# Patient Record
Sex: Female | Born: 1955
Health system: Southern US, Community
[De-identification: ages and names within clinical notes are randomized; demographics above are authoritative.]

## PROBLEM LIST (undated history)

## (undated) DIAGNOSIS — K219 Gastro-esophageal reflux disease without esophagitis: Secondary | ICD-10-CM

## (undated) DIAGNOSIS — T8859XA Other complications of anesthesia, initial encounter: Secondary | ICD-10-CM

## (undated) DIAGNOSIS — K635 Polyp of colon: Secondary | ICD-10-CM

## (undated) DIAGNOSIS — M8580 Other specified disorders of bone density and structure, unspecified site: Secondary | ICD-10-CM

## (undated) DIAGNOSIS — Z8719 Personal history of other diseases of the digestive system: Secondary | ICD-10-CM

## (undated) DIAGNOSIS — I471 Supraventricular tachycardia, unspecified: Secondary | ICD-10-CM

## (undated) DIAGNOSIS — B029 Zoster without complications: Secondary | ICD-10-CM

## (undated) DIAGNOSIS — I2542 Coronary artery dissection: Secondary | ICD-10-CM

## (undated) DIAGNOSIS — N890 Mild vaginal dysplasia: Secondary | ICD-10-CM

## (undated) DIAGNOSIS — R112 Nausea with vomiting, unspecified: Secondary | ICD-10-CM

## (undated) DIAGNOSIS — C50412 Malignant neoplasm of upper-outer quadrant of left female breast: Secondary | ICD-10-CM

## (undated) DIAGNOSIS — C50919 Malignant neoplasm of unspecified site of unspecified female breast: Secondary | ICD-10-CM

## (undated) DIAGNOSIS — S060X9A Concussion with loss of consciousness of unspecified duration, initial encounter: Secondary | ICD-10-CM

## (undated) DIAGNOSIS — Z8489 Family history of other specified conditions: Secondary | ICD-10-CM

## (undated) DIAGNOSIS — Z9889 Other specified postprocedural states: Secondary | ICD-10-CM

## (undated) DIAGNOSIS — I8390 Asymptomatic varicose veins of unspecified lower extremity: Secondary | ICD-10-CM

## (undated) DIAGNOSIS — G43909 Migraine, unspecified, not intractable, without status migrainosus: Secondary | ICD-10-CM

## (undated) DIAGNOSIS — I5189 Other ill-defined heart diseases: Secondary | ICD-10-CM

## (undated) DIAGNOSIS — Z923 Personal history of irradiation: Secondary | ICD-10-CM

## (undated) DIAGNOSIS — T4145XA Adverse effect of unspecified anesthetic, initial encounter: Secondary | ICD-10-CM

## (undated) DIAGNOSIS — I10 Essential (primary) hypertension: Secondary | ICD-10-CM

## (undated) DIAGNOSIS — R0789 Other chest pain: Secondary | ICD-10-CM

## (undated) HISTORY — PX: VAGINAL HYSTERECTOMY: SUR661

## (undated) HISTORY — DX: Polyp of colon: K63.5

## (undated) HISTORY — DX: Asymptomatic varicose veins of unspecified lower extremity: I83.90

## (undated) HISTORY — DX: Other ill-defined heart diseases: I51.89

## (undated) HISTORY — DX: Coronary artery dissection: I25.42

## (undated) HISTORY — DX: Supraventricular tachycardia, unspecified: I47.10

## (undated) HISTORY — DX: Malignant neoplasm of unspecified site of unspecified female breast: C50.919

## (undated) HISTORY — DX: Other chest pain: R07.89

## (undated) HISTORY — DX: Malignant neoplasm of upper-outer quadrant of left female breast: C50.412

## (undated) HISTORY — PX: TUBAL LIGATION: SHX77

## (undated) HISTORY — DX: Concussion with loss of consciousness of unspecified duration, initial encounter: S06.0X9A

## (undated) HISTORY — DX: Migraine, unspecified, not intractable, without status migrainosus: G43.909

## (undated) HISTORY — PX: MASTECTOMY: SHX3

## (undated) HISTORY — DX: Gastro-esophageal reflux disease without esophagitis: K21.9

## (undated) HISTORY — DX: Essential (primary) hypertension: I10

## (undated) HISTORY — PX: DILATION AND CURETTAGE OF UTERUS: SHX78

## (undated) HISTORY — DX: Other specified disorders of bone density and structure, unspecified site: M85.80

## (undated) HISTORY — DX: Zoster without complications: B02.9

## (undated) HISTORY — DX: Supraventricular tachycardia: I47.1

## (undated) HISTORY — DX: Mild vaginal dysplasia: N89.0

---

## 1977-01-30 DIAGNOSIS — S060X9A Concussion with loss of consciousness of unspecified duration, initial encounter: Secondary | ICD-10-CM

## 1977-01-30 DIAGNOSIS — S060XAA Concussion with loss of consciousness status unknown, initial encounter: Secondary | ICD-10-CM

## 1977-01-30 HISTORY — DX: Concussion with loss of consciousness of unspecified duration, initial encounter: S06.0X9A

## 1977-01-30 HISTORY — DX: Concussion with loss of consciousness status unknown, initial encounter: S06.0XAA

## 1978-01-30 HISTORY — PX: OTHER SURGICAL HISTORY: SHX169

## 2004-05-03 ENCOUNTER — Ambulatory Visit: Payer: Self-pay | Admitting: Obstetrics and Gynecology

## 2005-06-14 ENCOUNTER — Ambulatory Visit: Payer: Self-pay | Admitting: Obstetrics and Gynecology

## 2006-06-19 ENCOUNTER — Ambulatory Visit: Payer: Self-pay | Admitting: Obstetrics and Gynecology

## 2007-07-03 ENCOUNTER — Ambulatory Visit: Payer: Self-pay | Admitting: Obstetrics and Gynecology

## 2008-07-22 ENCOUNTER — Ambulatory Visit: Payer: Self-pay | Admitting: Obstetrics and Gynecology

## 2009-01-30 HISTORY — PX: COLONOSCOPY: SHX174

## 2009-01-30 LAB — HM COLONOSCOPY

## 2009-08-05 ENCOUNTER — Ambulatory Visit: Payer: Self-pay

## 2009-11-26 ENCOUNTER — Ambulatory Visit: Payer: Self-pay | Admitting: Unknown Physician Specialty

## 2010-08-05 ENCOUNTER — Ambulatory Visit: Payer: Self-pay | Admitting: Internal Medicine

## 2010-08-18 ENCOUNTER — Ambulatory Visit: Payer: Self-pay | Admitting: Surgery

## 2011-02-22 ENCOUNTER — Ambulatory Visit: Payer: Self-pay | Admitting: Surgery

## 2012-02-23 ENCOUNTER — Ambulatory Visit: Payer: Self-pay | Admitting: Obstetrics and Gynecology

## 2012-08-01 LAB — HM PAP SMEAR: HM Pap smear: NEGATIVE

## 2013-03-06 ENCOUNTER — Ambulatory Visit: Payer: Self-pay | Admitting: Obstetrics and Gynecology

## 2014-03-10 ENCOUNTER — Ambulatory Visit: Payer: Self-pay | Admitting: Obstetrics and Gynecology

## 2014-03-10 LAB — HM MAMMOGRAPHY

## 2014-08-11 ENCOUNTER — Encounter: Payer: Self-pay | Admitting: Obstetrics and Gynecology

## 2014-08-25 ENCOUNTER — Encounter: Payer: Self-pay | Admitting: Obstetrics and Gynecology

## 2014-08-25 ENCOUNTER — Ambulatory Visit (INDEPENDENT_AMBULATORY_CARE_PROVIDER_SITE_OTHER): Payer: No Typology Code available for payment source | Admitting: Obstetrics and Gynecology

## 2014-08-25 VITALS — BP 140/72 | HR 90 | Ht 62.0 in | Wt 155.3 lb

## 2014-08-25 DIAGNOSIS — Z1211 Encounter for screening for malignant neoplasm of colon: Secondary | ICD-10-CM | POA: Diagnosis not present

## 2014-08-25 DIAGNOSIS — Z780 Asymptomatic menopausal state: Secondary | ICD-10-CM

## 2014-08-25 DIAGNOSIS — Z0000 Encounter for general adult medical examination without abnormal findings: Secondary | ICD-10-CM

## 2014-08-25 DIAGNOSIS — I471 Supraventricular tachycardia: Secondary | ICD-10-CM | POA: Insufficient documentation

## 2014-08-25 DIAGNOSIS — K219 Gastro-esophageal reflux disease without esophagitis: Secondary | ICD-10-CM | POA: Insufficient documentation

## 2014-08-25 DIAGNOSIS — Z90710 Acquired absence of both cervix and uterus: Secondary | ICD-10-CM | POA: Diagnosis not present

## 2014-08-25 DIAGNOSIS — M76899 Other specified enthesopathies of unspecified lower limb, excluding foot: Secondary | ICD-10-CM | POA: Insufficient documentation

## 2014-08-25 DIAGNOSIS — N890 Mild vaginal dysplasia: Secondary | ICD-10-CM

## 2014-08-25 DIAGNOSIS — Z01419 Encounter for gynecological examination (general) (routine) without abnormal findings: Secondary | ICD-10-CM

## 2014-08-25 DIAGNOSIS — M545 Low back pain, unspecified: Secondary | ICD-10-CM | POA: Insufficient documentation

## 2014-08-25 DIAGNOSIS — R638 Other symptoms and signs concerning food and fluid intake: Secondary | ICD-10-CM | POA: Diagnosis not present

## 2014-08-25 DIAGNOSIS — B029 Zoster without complications: Secondary | ICD-10-CM | POA: Insufficient documentation

## 2014-08-25 DIAGNOSIS — Z8619 Personal history of other infectious and parasitic diseases: Secondary | ICD-10-CM | POA: Insufficient documentation

## 2014-08-25 DIAGNOSIS — E559 Vitamin D deficiency, unspecified: Secondary | ICD-10-CM | POA: Insufficient documentation

## 2014-08-25 DIAGNOSIS — G43909 Migraine, unspecified, not intractable, without status migrainosus: Secondary | ICD-10-CM | POA: Insufficient documentation

## 2014-08-25 DIAGNOSIS — I10 Essential (primary) hypertension: Secondary | ICD-10-CM | POA: Insufficient documentation

## 2014-08-25 MED ORDER — ESTRADIOL 0.5 MG PO TABS: 0.5000 mg | ORAL_TABLET | Freq: Every day | ORAL | Status: DC
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
## 2014-08-25 NOTE — Patient Instructions (Signed)
1. No Pap smear Until 2017. 2.  Continue with annual mammograms. 3.  Colon cancer screening with stool cards. 4.  Refill estradiol 0.5 mg daily. 5.  Continue with calcium and vitamin D for osteopenia area. 6.  Return in one year for annual exam

## 2014-08-31 ENCOUNTER — Other Ambulatory Visit: Payer: Self-pay | Admitting: Obstetrics and Gynecology

## 2014-09-02 ENCOUNTER — Other Ambulatory Visit: Payer: Self-pay | Admitting: Obstetrics and Gynecology

## 2014-09-04 ENCOUNTER — Other Ambulatory Visit: Payer: Self-pay | Admitting: Obstetrics and Gynecology

## 2014-09-06 LAB — FECAL OCCULT BLOOD, IMMUNOCHEMICAL: FECAL OCCULT BLD: NEGATIVE

## 2015-03-18 ENCOUNTER — Other Ambulatory Visit: Payer: Self-pay | Admitting: Obstetrics and Gynecology

## 2015-03-18 DIAGNOSIS — Z1231 Encounter for screening mammogram for malignant neoplasm of breast: Secondary | ICD-10-CM

## 2015-04-06 ENCOUNTER — Ambulatory Visit
Admission: RE | Admit: 2015-04-06 | Discharge: 2015-04-06 | Disposition: A | Discharge status: Home / Self Care | Payer: No Typology Code available for payment source | Source: Ambulatory Visit | Attending: Obstetrics and Gynecology | Admitting: Obstetrics and Gynecology

## 2015-04-06 DIAGNOSIS — Z1231 Encounter for screening mammogram for malignant neoplasm of breast: Secondary | ICD-10-CM | POA: Insufficient documentation

## 2015-08-26 ENCOUNTER — Ambulatory Visit (INDEPENDENT_AMBULATORY_CARE_PROVIDER_SITE_OTHER): Payer: No Typology Code available for payment source | Admitting: Obstetrics and Gynecology

## 2015-08-26 ENCOUNTER — Encounter: Payer: Self-pay | Admitting: Obstetrics and Gynecology

## 2015-08-26 VITALS — BP 123/84 | HR 84 | Ht 62.0 in | Wt 157.6 lb

## 2015-08-26 DIAGNOSIS — N951 Menopausal and female climacteric states: Secondary | ICD-10-CM | POA: Diagnosis not present

## 2015-08-26 DIAGNOSIS — Z01419 Encounter for gynecological examination (general) (routine) without abnormal findings: Secondary | ICD-10-CM

## 2015-08-26 DIAGNOSIS — Z1211 Encounter for screening for malignant neoplasm of colon: Secondary | ICD-10-CM

## 2015-08-26 DIAGNOSIS — Z90710 Acquired absence of both cervix and uterus: Secondary | ICD-10-CM | POA: Diagnosis not present

## 2015-08-26 DIAGNOSIS — N900 Mild vulvar dysplasia: Secondary | ICD-10-CM

## 2015-08-26 MED ORDER — ESTRADIOL 0.5 MG PO TABS: 0.5000 mg | ORAL_TABLET | Freq: Every day | ORAL | 10 refills | Status: DC

## 2015-08-26 NOTE — Patient Instructions (Signed)
Preventive Care for Adults, Female A healthy lifestyle and preventive care can promote health and wellness. Preventive health guidelines for women include the following key practices.  A routine yearly physical is a good way to check with your health care provider about your health and preventive screening. It is a chance to share any concerns and updates on your health and to receive a thorough exam.  Visit your dentist for a routine exam and preventive care every 6 months. Brush your teeth twice a day and floss once a day. Good oral hygiene prevents tooth decay and gum disease.  The frequency of eye exams is based on your age, health, family medical history, use of contact lenses, and other factors. Follow your health care provider's recommendations for frequency of eye exams.  Eat a healthy diet. Foods like vegetables, fruits, whole grains, low-fat dairy products, and lean protein foods contain the nutrients you need without too many calories. Decrease your intake of foods high in solid fats, added sugars, and salt. Eat the right amount of calories for you.Get information about a proper diet from your health care provider, if necessary.  Regular physical exercise is one of the most important things you can do for your health. Most adults should get at least 150 minutes of moderate-intensity exercise (any activity that increases your heart rate and causes you to sweat) each week. In addition, most adults need muscle-strengthening exercises on 2 or more days a week.  Maintain a healthy weight. The body mass index (BMI) is a screening tool to identify possible weight problems. It provides an estimate of body fat based on height and weight. Your health care provider can find your BMI and can help you achieve or maintain a healthy weight.For adults 20 years and older:  A BMI below 18.5 is considered underweight.  A BMI of 18.5 to 24.9 is normal.  A BMI of 25 to 29.9 is considered overweight.  A  BMI of 30 and above is considered obese.  Maintain normal blood lipids and cholesterol levels by exercising and minimizing your intake of saturated fat. Eat a balanced diet with plenty of fruit and vegetables. Blood tests for lipids and cholesterol should begin at age 45 and be repeated every 5 years. If your lipid or cholesterol levels are high, you are over 50, or you are at high risk for heart disease, you may need your cholesterol levels checked more frequently.Ongoing high lipid and cholesterol levels should be treated with medicines if diet and exercise are not working.  If you smoke, find out from your health care provider how to quit. If you do not use tobacco, do not start.  Lung cancer screening is recommended for adults aged 45-80 years who are at high risk for developing lung cancer because of a history of smoking. A yearly low-dose CT scan of the lungs is recommended for people who have at least a 30-pack-year history of smoking and are a current smoker or have quit within the past 15 years. A pack year of smoking is smoking an average of 1 pack of cigarettes a day for 1 year (for example: 1 pack a day for 30 years or 2 packs a day for 15 years). Yearly screening should continue until the smoker has stopped smoking for at least 15 years. Yearly screening should be stopped for people who develop a health problem that would prevent them from having lung cancer treatment.  If you are pregnant, do not drink alcohol. If you are  breastfeeding, be very cautious about drinking alcohol. If you are not pregnant and choose to drink alcohol, do not have more than 1 drink per day. One drink is considered to be 12 ounces (355 mL) of beer, 5 ounces (148 mL) of wine, or 1.5 ounces (44 mL) of liquor.  Avoid use of street drugs. Do not share needles with anyone. Ask for help if you need support or instructions about stopping the use of drugs.  High blood pressure causes heart disease and increases the risk  of stroke. Your blood pressure should be checked at least every 1 to 2 years. Ongoing high blood pressure should be treated with medicines if weight loss and exercise do not work.  If you are 55-79 years old, ask your health care provider if you should take aspirin to prevent strokes.  Diabetes screening is done by taking a blood sample to check your blood glucose level after you have not eaten for a certain period of time (fasting). If you are not overweight and you do not have risk factors for diabetes, you should be screened once every 3 years starting at age 45. If you are overweight or obese and you are 40-70 years of age, you should be screened for diabetes every year as part of your cardiovascular risk assessment.  Breast cancer screening is essential preventive care for women. You should practice "breast self-awareness." This means understanding the normal appearance and feel of your breasts and may include breast self-examination. Any changes detected, no matter how small, should be reported to a health care provider. Women in their 20s and 30s should have a clinical breast exam (CBE) by a health care provider as part of a regular health exam every 1 to 3 years. After age 40, women should have a CBE every year. Starting at age 40, women should consider having a mammogram (breast X-ray test) every year. Women who have a family history of breast cancer should talk to their health care provider about genetic screening. Women at a high risk of breast cancer should talk to their health care providers about having an MRI and a mammogram every year.  Breast cancer gene (BRCA)-related cancer risk assessment is recommended for women who have family members with BRCA-related cancers. BRCA-related cancers include breast, ovarian, tubal, and peritoneal cancers. Having family members with these cancers may be associated with an increased risk for harmful changes (mutations) in the breast cancer genes BRCA1 and  BRCA2. Results of the assessment will determine the need for genetic counseling and BRCA1 and BRCA2 testing.  Your health care provider may recommend that you be screened regularly for cancer of the pelvic organs (ovaries, uterus, and vagina). This screening involves a pelvic examination, including checking for microscopic changes to the surface of your cervix (Pap test). You may be encouraged to have this screening done every 3 years, beginning at age 21.  For women ages 30-65, health care providers may recommend pelvic exams and Pap testing every 3 years, or they may recommend the Pap and pelvic exam, combined with testing for human papilloma virus (HPV), every 5 years. Some types of HPV increase your risk of cervical cancer. Testing for HPV may also be done on women of any age with unclear Pap test results.  Other health care providers may not recommend any screening for nonpregnant women who are considered low risk for pelvic cancer and who do not have symptoms. Ask your health care provider if a screening pelvic exam is right for   you.  If you have had past treatment for cervical cancer or a condition that could lead to cancer, you need Pap tests and screening for cancer for at least 20 years after your treatment. If Pap tests have been discontinued, your risk factors (such as having a new sexual partner) need to be reassessed to determine if screening should resume. Some women have medical problems that increase the chance of getting cervical cancer. In these cases, your health care provider may recommend more frequent screening and Pap tests.  Colorectal cancer can be detected and often prevented. Most routine colorectal cancer screening begins at the age of 50 years and continues through age 75 years. However, your health care provider may recommend screening at an earlier age if you have risk factors for colon cancer. On a yearly basis, your health care provider may provide home test kits to check  for hidden blood in the stool. Use of a small camera at the end of a tube, to directly examine the colon (sigmoidoscopy or colonoscopy), can detect the earliest forms of colorectal cancer. Talk to your health care provider about this at age 50, when routine screening begins. Direct exam of the colon should be repeated every 5-10 years through age 75 years, unless early forms of precancerous polyps or small growths are found.  People who are at an increased risk for hepatitis B should be screened for this virus. You are considered at high risk for hepatitis B if:  You were born in a country where hepatitis B occurs often. Talk with your health care provider about which countries are considered high risk.  Your parents were born in a high-risk country and you have not received a shot to protect against hepatitis B (hepatitis B vaccine).  You have HIV or AIDS.  You use needles to inject street drugs.  You live with, or have sex with, someone who has hepatitis B.  You get hemodialysis treatment.  You take certain medicines for conditions like cancer, organ transplantation, and autoimmune conditions.  Hepatitis C blood testing is recommended for all people born from 1945 through 1965 and any individual with known risks for hepatitis C.  Practice safe sex. Use condoms and avoid high-risk sexual practices to reduce the spread of sexually transmitted infections (STIs). STIs include gonorrhea, chlamydia, syphilis, trichomonas, herpes, HPV, and human immunodeficiency virus (HIV). Herpes, HIV, and HPV are viral illnesses that have no cure. They can result in disability, cancer, and death.  You should be screened for sexually transmitted illnesses (STIs) including gonorrhea and chlamydia if:  You are sexually active and are younger than 24 years.  You are older than 24 years and your health care provider tells you that you are at risk for this type of infection.  Your sexual activity has changed  since you were last screened and you are at an increased risk for chlamydia or gonorrhea. Ask your health care provider if you are at risk.  If you are at risk of being infected with HIV, it is recommended that you take a prescription medicine daily to prevent HIV infection. This is called preexposure prophylaxis (PrEP). You are considered at risk if:  You are sexually active and do not regularly use condoms or know the HIV status of your partner(s).  You take drugs by injection.  You are sexually active with a partner who has HIV.  Talk with your health care provider about whether you are at high risk of being infected with HIV. If   you choose to begin PrEP, you should first be tested for HIV. You should then be tested every 3 months for as long as you are taking PrEP.  Osteoporosis is a disease in which the bones lose minerals and strength with aging. This can result in serious bone fractures or breaks. The risk of osteoporosis can be identified using a bone density scan. Women ages 67 years and over and women at risk for fractures or osteoporosis should discuss screening with their health care providers. Ask your health care provider whether you should take a calcium supplement or vitamin D to reduce the rate of osteoporosis.  Menopause can be associated with physical symptoms and risks. Hormone replacement therapy is available to decrease symptoms and risks. You should talk to your health care provider about whether hormone replacement therapy is right for you.  Use sunscreen. Apply sunscreen liberally and repeatedly throughout the day. You should seek shade when your shadow is shorter than you. Protect yourself by wearing long sleeves, pants, a wide-brimmed hat, and sunglasses year round, whenever you are outdoors.  Once a month, do a whole body skin exam, using a mirror to look at the skin on your back. Tell your health care provider of new moles, moles that have irregular borders, moles that  are larger than a pencil eraser, or moles that have changed in shape or color.  Stay current with required vaccines (immunizations).  Influenza vaccine. All adults should be immunized every year.  Tetanus, diphtheria, and acellular pertussis (Td, Tdap) vaccine. Pregnant women should receive 1 dose of Tdap vaccine during each pregnancy. The dose should be obtained regardless of the length of time since the last dose. Immunization is preferred during the 27th-36th week of gestation. An adult who has not previously received Tdap or who does not know her vaccine status should receive 1 dose of Tdap. This initial dose should be followed by tetanus and diphtheria toxoids (Td) booster doses every 10 years. Adults with an unknown or incomplete history of completing a 3-dose immunization series with Td-containing vaccines should begin or complete a primary immunization series including a Tdap dose. Adults should receive a Td booster every 10 years.  Varicella vaccine. An adult without evidence of immunity to varicella should receive 2 doses or a second dose if she has previously received 1 dose. Pregnant females who do not have evidence of immunity should receive the first dose after pregnancy. This first dose should be obtained before leaving the health care facility. The second dose should be obtained 4-8 weeks after the first dose.  Human papillomavirus (HPV) vaccine. Females aged 13-26 years who have not received the vaccine previously should obtain the 3-dose series. The vaccine is not recommended for use in pregnant females. However, pregnancy testing is not needed before receiving a dose. If a female is found to be pregnant after receiving a dose, no treatment is needed. In that case, the remaining doses should be delayed until after the pregnancy. Immunization is recommended for any person with an immunocompromised condition through the age of 61 years if she did not get any or all doses earlier. During the  3-dose series, the second dose should be obtained 4-8 weeks after the first dose. The third dose should be obtained 24 weeks after the first dose and 16 weeks after the second dose.  Zoster vaccine. One dose is recommended for adults aged 30 years or older unless certain conditions are present.  Measles, mumps, and rubella (MMR) vaccine. Adults born  before 1957 generally are considered immune to measles and mumps. Adults born in 1957 or later should have 1 or more doses of MMR vaccine unless there is a contraindication to the vaccine or there is laboratory evidence of immunity to each of the three diseases. A routine second dose of MMR vaccine should be obtained at least 28 days after the first dose for students attending postsecondary schools, health care workers, or international travelers. People who received inactivated measles vaccine or an unknown type of measles vaccine during 1963-1967 should receive 2 doses of MMR vaccine. People who received inactivated mumps vaccine or an unknown type of mumps vaccine before 1979 and are at high risk for mumps infection should consider immunization with 2 doses of MMR vaccine. For females of childbearing age, rubella immunity should be determined. If there is no evidence of immunity, females who are not pregnant should be vaccinated. If there is no evidence of immunity, females who are pregnant should delay immunization until after pregnancy. Unvaccinated health care workers born before 1957 who lack laboratory evidence of measles, mumps, or rubella immunity or laboratory confirmation of disease should consider measles and mumps immunization with 2 doses of MMR vaccine or rubella immunization with 1 dose of MMR vaccine.  Pneumococcal 13-valent conjugate (PCV13) vaccine. When indicated, a person who is uncertain of his immunization history and has no record of immunization should receive the PCV13 vaccine. All adults 65 years of age and older should receive this  vaccine. An adult aged 19 years or older who has certain medical conditions and has not been previously immunized should receive 1 dose of PCV13 vaccine. This PCV13 should be followed with a dose of pneumococcal polysaccharide (PPSV23) vaccine. Adults who are at high risk for pneumococcal disease should obtain the PPSV23 vaccine at least 8 weeks after the dose of PCV13 vaccine. Adults older than 60 years of age who have normal immune system function should obtain the PPSV23 vaccine dose at least 1 year after the dose of PCV13 vaccine.  Pneumococcal polysaccharide (PPSV23) vaccine. When PCV13 is also indicated, PCV13 should be obtained first. All adults aged 65 years and older should be immunized. An adult younger than age 65 years who has certain medical conditions should be immunized. Any person who resides in a nursing home or long-term care facility should be immunized. An adult smoker should be immunized. People with an immunocompromised condition and certain other conditions should receive both PCV13 and PPSV23 vaccines. People with human immunodeficiency virus (HIV) infection should be immunized as soon as possible after diagnosis. Immunization during chemotherapy or radiation therapy should be avoided. Routine use of PPSV23 vaccine is not recommended for American Indians, [place] Natives, or people younger than 65 years unless there are medical conditions that require PPSV23 vaccine. When indicated, people who have unknown immunization and have no record of immunization should receive PPSV23 vaccine. One-time revaccination 5 years after the first dose of PPSV23 is recommended for people aged 19-64 years who have chronic kidney failure, nephrotic syndrome, asplenia, or immunocompromised conditions. People who received 1-2 doses of PPSV23 before age 65 years should receive another dose of PPSV23 vaccine at age 65 years or later if at least 5 years have passed since the previous dose. Doses of PPSV23 are not  needed for people immunized with PPSV23 at or after age 65 years.  Meningococcal vaccine. Adults with asplenia or persistent complement component deficiencies should receive 2 doses of quadrivalent meningococcal conjugate (MenACWY-D) vaccine. The doses should be obtained   at least 2 months apart. Microbiologists working with certain meningococcal bacteria, [place] recruits, people at risk during an outbreak, and people who travel to or live in countries with a high rate of meningitis should be immunized. A first-year college student up through age 34 years who is living in a residence hall should receive a dose if she did not receive a dose on or after her 16th birthday. Adults who have certain high-risk conditions should receive one or more doses of vaccine.  Hepatitis A vaccine. Adults who wish to be protected from this disease, have certain high-risk conditions, work with hepatitis A-infected animals, work in hepatitis A research labs, or travel to or work in countries with a high rate of hepatitis A should be immunized. Adults who were previously unvaccinated and who anticipate close contact with an international adoptee during the first 60 days after arrival in the [place] from a country with a high rate of hepatitis A should be immunized.  Hepatitis B vaccine. Adults who wish to be protected from this disease, have certain high-risk conditions, may be exposed to blood or other infectious body fluids, are household contacts or sex partners of hepatitis B positive people, are clients or workers in certain care facilities, or travel to or work in countries with a high rate of hepatitis B should be immunized.  Haemophilus influenzae type b (Hib) vaccine. A previously unvaccinated person with asplenia or sickle cell disease or having a scheduled splenectomy should receive 1 dose of Hib vaccine. Regardless of previous immunization, a recipient of a hematopoietic stem cell transplant should receive a  3-dose series 6-12 months after her successful transplant. Hib vaccine is not recommended for adults with HIV infection. Preventive Services / Frequency Ages 35 to 4 years  Blood pressure check.** / Every 3-5 years.  Lipid and cholesterol check.** / Every 5 years beginning at age 60.  Clinical breast exam.** / Every 3 years for women in their 71s and 10s.  BRCA-related cancer risk assessment.** / For women who have family members with a BRCA-related cancer (breast, ovarian, tubal, or peritoneal cancers).  Pap test.** / Every 2 years from ages 76 through 26. Every 3 years starting at age 61 through age 76 or 93 with a history of 3 consecutive normal Pap tests.  HPV screening.** / Every 3 years from ages 37 through ages 60 to 51 with a history of 3 consecutive normal Pap tests.  Hepatitis C blood test.** / For any individual with known risks for hepatitis C.  Skin self-exam. / Monthly.  Influenza vaccine. / Every year.  Tetanus, diphtheria, and acellular pertussis (Tdap, Td) vaccine.** / Consult your health care provider. Pregnant women should receive 1 dose of Tdap vaccine during each pregnancy. 1 dose of Td every 10 years.  Varicella vaccine.** / Consult your health care provider. Pregnant females who do not have evidence of immunity should receive the first dose after pregnancy.  HPV vaccine. / 3 doses over 6 months, if 93 and younger. The vaccine is not recommended for use in pregnant females. However, pregnancy testing is not needed before receiving a dose.  Measles, mumps, rubella (MMR) vaccine.** / You need at least 1 dose of MMR if you were born in 1957 or later. You may also need a 2nd dose. For females of childbearing age, rubella immunity should be determined. If there is no evidence of immunity, females who are not pregnant should be vaccinated. If there is no evidence of immunity, females who are  pregnant should delay immunization until after pregnancy.  Pneumococcal  13-valent conjugate (PCV13) vaccine.** / Consult your health care provider.  Pneumococcal polysaccharide (PPSV23) vaccine.** / 1 to 2 doses if you smoke cigarettes or if you have certain conditions.  Meningococcal vaccine.** / 1 dose if you are age 68 to 8 years and a Market researcher living in a residence hall, or have one of several medical conditions, you need to get vaccinated against meningococcal disease. You may also need additional booster doses.  Hepatitis A vaccine.** / Consult your health care provider.  Hepatitis B vaccine.** / Consult your health care provider.  Haemophilus influenzae type b (Hib) vaccine.** / Consult your health care provider. Ages 7 to 53 years  Blood pressure check.** / Every year.  Lipid and cholesterol check.** / Every 5 years beginning at age 25 years.  Lung cancer screening. / Every year if you are aged 11-80 years and have a 30-pack-year history of smoking and currently smoke or have quit within the past 15 years. Yearly screening is stopped once you have quit smoking for at least 15 years or develop a health problem that would prevent you from having lung cancer treatment.  Clinical breast exam.** / Every year after age 48 years.  BRCA-related cancer risk assessment.** / For women who have family members with a BRCA-related cancer (breast, ovarian, tubal, or peritoneal cancers).  Mammogram.** / Every year beginning at age 41 years and continuing for as long as you are in good health. Consult with your health care provider.  Pap test.** / Every 3 years starting at age 65 years through age 37 or 70 years with a history of 3 consecutive normal Pap tests.  HPV screening.** / Every 3 years from ages 72 years through ages 60 to 40 years with a history of 3 consecutive normal Pap tests.  Fecal occult blood test (FOBT) of stool. / Every year beginning at age 21 years and continuing until age 5 years. You may not need to do this test if you get  a colonoscopy every 10 years.  Flexible sigmoidoscopy or colonoscopy.** / Every 5 years for a flexible sigmoidoscopy or every 10 years for a colonoscopy beginning at age 35 years and continuing until age 48 years.  Hepatitis C blood test.** / For all people born from [birth year] through 1965 and any individual with known risks for hepatitis C.  Skin self-exam. / Monthly.  Influenza vaccine. / Every year.  Tetanus, diphtheria, and acellular pertussis (Tdap/Td) vaccine.** / Consult your health care provider. Pregnant women should receive 1 dose of Tdap vaccine during each pregnancy. 1 dose of Td every 10 years.  Varicella vaccine.** / Consult your health care provider. Pregnant females who do not have evidence of immunity should receive the first dose after pregnancy.  Zoster vaccine.** / 1 dose for adults aged 30 years or older.  Measles, mumps, rubella (MMR) vaccine.** / You need at least 1 dose of MMR if you were born in 1957 or later. You may also need a second dose. For females of childbearing age, rubella immunity should be determined. If there is no evidence of immunity, females who are not pregnant should be vaccinated. If there is no evidence of immunity, females who are pregnant should delay immunization until after pregnancy.  Pneumococcal 13-valent conjugate (PCV13) vaccine.** / Consult your health care provider.  Pneumococcal polysaccharide (PPSV23) vaccine.** / 1 to 2 doses if you smoke cigarettes or if you have certain conditions.  Meningococcal vaccine.** /  Consult your health care provider.  Hepatitis A vaccine.** / Consult your health care provider.  Hepatitis B vaccine.** / Consult your health care provider.  Haemophilus influenzae type b (Hib) vaccine.** / Consult your health care provider. Ages 64 years and over  Blood pressure check.** / Every year.  Lipid and cholesterol check.** / Every 5 years beginning at age 23 years.  Lung cancer screening. / Every year if you  are aged 16-80 years and have a 30-pack-year history of smoking and currently smoke or have quit within the past 15 years. Yearly screening is stopped once you have quit smoking for at least 15 years or develop a health problem that would prevent you from having lung cancer treatment.  Clinical breast exam.** / Every year after age 74 years.  BRCA-related cancer risk assessment.** / For women who have family members with a BRCA-related cancer (breast, ovarian, tubal, or peritoneal cancers).  Mammogram.** / Every year beginning at age 44 years and continuing for as long as you are in good health. Consult with your health care provider.  Pap test.** / Every 3 years starting at age 58 years through age 22 or 39 years with 3 consecutive normal Pap tests. Testing can be stopped between 65 and 70 years with 3 consecutive normal Pap tests and no abnormal Pap or HPV tests in the past 10 years.  HPV screening.** / Every 3 years from ages 64 years through ages 70 or 61 years with a history of 3 consecutive normal Pap tests. Testing can be stopped between 65 and 70 years with 3 consecutive normal Pap tests and no abnormal Pap or HPV tests in the past 10 years.  Fecal occult blood test (FOBT) of stool. / Every year beginning at age 40 years and continuing until age 27 years. You may not need to do this test if you get a colonoscopy every 10 years.  Flexible sigmoidoscopy or colonoscopy.** / Every 5 years for a flexible sigmoidoscopy or every 10 years for a colonoscopy beginning at age 7 years and continuing until age 32 years.  Hepatitis C blood test.** / For all people born from [birth year] through 1965 and any individual with known risks for hepatitis C.  Osteoporosis screening.** / A one-time screening for women ages 30 years and over and women at risk for fractures or osteoporosis.  Skin self-exam. / Monthly.  Influenza vaccine. / Every year.  Tetanus, diphtheria, and acellular pertussis (Tdap/Td)  vaccine.** / 1 dose of Td every 10 years.  Varicella vaccine.** / Consult your health care provider.  Zoster vaccine.** / 1 dose for adults aged 35 years or older.  Pneumococcal 13-valent conjugate (PCV13) vaccine.** / Consult your health care provider.  Pneumococcal polysaccharide (PPSV23) vaccine.** / 1 dose for all adults aged 46 years and older.  Meningococcal vaccine.** / Consult your health care provider.  Hepatitis A vaccine.** / Consult your health care provider.  Hepatitis B vaccine.** / Consult your health care provider.  Haemophilus influenzae type b (Hib) vaccine.** / Consult your health care provider. ** Family history and personal history of risk and conditions may change your health care provider's recommendations.   This information is not intended to replace advice given to you by your health care provider. Make sure you discuss any questions you have with your health care provider.   Document Released: 03/14/2001 Document Revised: 02/06/2014 Document Reviewed: 06/13/2010 Elsevier Interactive Patient Education [employer].
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
## 2015-09-02 LAB — PAP IG AND HPV HIGH-RISK: PAP SMEAR COMMENT: 0

## 2015-09-02 LAB — HPV, LOW VOLUME (REFLEX): HPV low volume reflex: NEGATIVE

## 2015-09-05 LAB — FECAL OCCULT BLOOD, IMMUNOCHEMICAL: FECAL OCCULT BLD: NEGATIVE

## 2015-09-06 ENCOUNTER — Other Ambulatory Visit: Payer: Self-pay | Admitting: Obstetrics and Gynecology

## 2015-09-07 ENCOUNTER — Other Ambulatory Visit: Payer: Self-pay | Admitting: Obstetrics and Gynecology

## 2016-03-06 ENCOUNTER — Other Ambulatory Visit: Payer: Self-pay | Admitting: Obstetrics and Gynecology

## 2016-03-06 DIAGNOSIS — Z1231 Encounter for screening mammogram for malignant neoplasm of breast: Secondary | ICD-10-CM

## 2016-04-06 ENCOUNTER — Ambulatory Visit
Admission: RE | Admit: 2016-04-06 | Discharge: 2016-04-06 | Disposition: A | Discharge status: Home / Self Care | Payer: No Typology Code available for payment source | Source: Ambulatory Visit | Attending: Obstetrics and Gynecology | Admitting: Obstetrics and Gynecology

## 2016-04-06 DIAGNOSIS — Z1231 Encounter for screening mammogram for malignant neoplasm of breast: Secondary | ICD-10-CM

## 2016-04-18 ENCOUNTER — Other Ambulatory Visit: Payer: Self-pay

## 2016-04-18 MED ORDER — ESTRADIOL 0.5 MG PO TABS: 0.5000 mg | ORAL_TABLET | Freq: Every day | ORAL | 1 refills | Status: DC

## 2016-08-16 DIAGNOSIS — H524 Presbyopia: Secondary | ICD-10-CM | POA: Diagnosis not present

## 2016-08-31 ENCOUNTER — Encounter: Payer: No Typology Code available for payment source | Admitting: Obstetrics and Gynecology
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
## 2016-09-13 ENCOUNTER — Ambulatory Visit (INDEPENDENT_AMBULATORY_CARE_PROVIDER_SITE_OTHER): Payer: BLUE CROSS/BLUE SHIELD | Admitting: Obstetrics and Gynecology

## 2016-09-13 ENCOUNTER — Encounter: Payer: Self-pay | Admitting: Obstetrics and Gynecology

## 2016-09-13 VITALS — BP 119/77 | HR 85 | Ht 62.0 in | Wt 155.8 lb

## 2016-09-13 DIAGNOSIS — N951 Menopausal and female climacteric states: Secondary | ICD-10-CM

## 2016-09-13 DIAGNOSIS — Z1211 Encounter for screening for malignant neoplasm of colon: Secondary | ICD-10-CM

## 2016-09-13 DIAGNOSIS — Z01419 Encounter for gynecological examination (general) (routine) without abnormal findings: Secondary | ICD-10-CM | POA: Diagnosis not present

## 2016-09-13 DIAGNOSIS — Z90710 Acquired absence of both cervix and uterus: Secondary | ICD-10-CM | POA: Diagnosis not present

## 2016-09-13 MED ORDER — ESTRADIOL 0.5 MG PO TABS: 0.5000 mg | ORAL_TABLET | Freq: Every day | ORAL | 11 refills | Status: DC

## 2016-09-13 NOTE — Patient Instructions (Addendum)
1. No Pap smear needed 2. Mammogram up-to-date 3. Stool guaiac cards are given for colon cancer screening 4. Milligrams her creatinine through primary care 5. Continue with healthy eating and exercise 6. Return in 1 year for annual    Health Maintenance for Postmenopausal Women Menopause is a normal process in which your reproductive ability comes to an end. This process happens gradually over a span of months to years, usually between the ages of 41 and 50. Menopause is complete when you have missed 12 consecutive menstrual periods. It is important to talk with your health care provider about some of the most common conditions that affect postmenopausal women, such as heart disease, cancer, and bone loss (osteoporosis). Adopting a healthy lifestyle and getting preventive care can help to promote your health and wellness. Those actions can also lower your chances of developing some of these common conditions. What should I know about menopause? During menopause, you may experience a number of symptoms, such as:  Moderate-to-severe hot flashes.  Night sweats.  Decrease in sex drive.  Mood swings.  Headaches.  Tiredness.  Irritability.  Memory problems.  Insomnia.  Choosing to treat or not to treat menopausal changes is an individual decision that you make with your health care provider. What should I know about hormone replacement therapy and supplements? Hormone therapy products are effective for treating symptoms that are associated with menopause, such as hot flashes and night sweats. Hormone replacement carries certain risks, especially as you become older. If you are thinking about using estrogen or estrogen with progestin treatments, discuss the benefits and risks with your health care provider. What should I know about heart disease and stroke? Heart disease, heart attack, and stroke become more likely as you age. This may be due, in part, to the hormonal changes that your  body experiences during menopause. These can affect how your body processes dietary fats, triglycerides, and cholesterol. Heart attack and stroke are both medical emergencies. There are many things that you can do to help prevent heart disease and stroke:  Have your blood pressure checked at least every 1-2 years. High blood pressure causes heart disease and increases the risk of stroke.  If you are 61-34 years old, ask your health care provider if you should take aspirin to prevent a heart attack or a stroke.  Do not use any tobacco products, including cigarettes, chewing tobacco, or electronic cigarettes. If you need help quitting, ask your health care provider.  It is important to eat a healthy diet and maintain a healthy weight. ? Be sure to include plenty of vegetables, fruits, low-fat dairy products, and lean protein. ? Avoid eating foods that are high in solid fats, added sugars, or salt (sodium).  Get regular exercise. This is one of the most important things that you can do for your health. ? Try to exercise for at least 150 minutes each week. The type of exercise that you do should increase your heart rate and make you sweat. This is known as moderate-intensity exercise. ? Try to do strengthening exercises at least twice each week. Do these in addition to the moderate-intensity exercise.  Know your numbers.Ask your health care provider to check your cholesterol and your blood glucose. Continue to have your blood tested as directed by your health care provider.  What should I know about cancer screening? There are several types of cancer. Take the following steps to reduce your risk and to catch any cancer development as early as possible. Breast  Cancer  Practice breast self-awareness. ? This means understanding how your breasts normally appear and feel. ? It also means doing regular breast self-exams. Let your health care provider know about any changes, no matter how small.  If  you are 30 or older, have a clinician do a breast exam (clinical breast exam or CBE) every year. Depending on your age, family history, and medical history, it may be recommended that you also have a yearly breast X-ray (mammogram).  If you have a family history of breast cancer, talk with your health care provider about genetic screening.  If you are at high risk for breast cancer, talk with your health care provider about having an MRI and a mammogram every year.  Breast cancer (BRCA) gene test is recommended for women who have family members with BRCA-related cancers. Results of the assessment will determine the need for genetic counseling and BRCA1 and for BRCA2 testing. BRCA-related cancers include these types: ? Breast. This occurs in males or females. ? Ovarian. ? Tubal. This may also be called fallopian tube cancer. ? Cancer of the abdominal or pelvic lining (peritoneal cancer). ? Prostate. ? Pancreatic.  Cervical, Uterine, and Ovarian Cancer Your health care provider may recommend that you be screened regularly for cancer of the pelvic organs. These include your ovaries, uterus, and vagina. This screening involves a pelvic exam, which includes checking for microscopic changes to the surface of your cervix (Pap test).  For women ages 21-65, health care providers may recommend a pelvic exam and a Pap test every three years. For women ages 57-65, they may recommend the Pap test and pelvic exam, combined with testing for human papilloma virus (HPV), every five years. Some types of HPV increase your risk of cervical cancer. Testing for HPV may also be done on women of any age who have unclear Pap test results.  Other health care providers may not recommend any screening for nonpregnant women who are considered low risk for pelvic cancer and have no symptoms. Ask your health care provider if a screening pelvic exam is right for you.  If you have had past treatment for cervical cancer or a  condition that could lead to cancer, you need Pap tests and screening for cancer for at least 20 years after your treatment. If Pap tests have been discontinued for you, your risk factors (such as having a new sexual partner) need to be reassessed to determine if you should start having screenings again. Some women have medical problems that increase the chance of getting cervical cancer. In these cases, your health care provider may recommend that you have screening and Pap tests more often.  If you have a family history of uterine cancer or ovarian cancer, talk with your health care provider about genetic screening.  If you have vaginal bleeding after reaching menopause, tell your health care provider.  There are currently no reliable tests available to screen for ovarian cancer.  Lung Cancer Lung cancer screening is recommended for adults 79-7 years old who are at high risk for lung cancer because of a history of smoking. A yearly low-dose CT scan of the lungs is recommended if you:  Currently smoke.  Have a history of at least 30 pack-years of smoking and you currently smoke or have quit within the past 15 years. A pack-year is smoking an average of one pack of cigarettes per day for one year.  Yearly screening should:  Continue until it has been 15 years since you  quit.  Stop if you develop a health problem that would prevent you from having lung cancer treatment.  Colorectal Cancer  This type of cancer can be detected and can often be prevented.  Routine colorectal cancer screening usually begins at age 67 and continues through age 73.  If you have risk factors for colon cancer, your health care provider may recommend that you be screened at an earlier age.  If you have a family history of colorectal cancer, talk with your health care provider about genetic screening.  Your health care provider may also recommend using home test kits to check for hidden blood in your stool.  A  small camera at the end of a tube can be used to examine your colon directly (sigmoidoscopy or colonoscopy). This is done to check for the earliest forms of colorectal cancer.  Direct examination of the colon should be repeated every 5-10 years until age 61. However, if early forms of precancerous polyps or small growths are found or if you have a family history or genetic risk for colorectal cancer, you may need to be screened more often.  Skin Cancer  Check your skin from head to toe regularly.  Monitor any moles. Be sure to tell your health care provider: ? About any new moles or changes in moles, especially if there is a change in a mole's shape or color. ? If you have a mole that is larger than the size of a pencil eraser.  If any of your family members has a history of skin cancer, especially at a young age, talk with your health care provider about genetic screening.  Always use sunscreen. Apply sunscreen liberally and repeatedly throughout the day.  Whenever you are outside, protect yourself by wearing long sleeves, pants, a wide-brimmed hat, and sunglasses.  What should I know about osteoporosis? Osteoporosis is a condition in which bone destruction happens more quickly than new bone creation. After menopause, you may be at an increased risk for osteoporosis. To help prevent osteoporosis or the bone fractures that can happen because of osteoporosis, the following is recommended:  If you are 77-96 years old, get at least 1,000 mg of calcium and at least 600 mg of vitamin D per day.  If you are older than age 84 but younger than age 24, get at least 1,200 mg of calcium and at least 600 mg of vitamin D per day.  If you are older than age 13, get at least 1,200 mg of calcium and at least 800 mg of vitamin D per day.  Smoking and excessive alcohol intake increase the risk of osteoporosis. Eat foods that are rich in calcium and vitamin D, and do weight-bearing exercises several times  each week as directed by your health care provider. What should I know about how menopause affects my mental health? Depression may occur at any age, but it is more common as you become older. Common symptoms of depression include:  Low or sad mood.  Changes in sleep patterns.  Changes in appetite or eating patterns.  Feeling an overall lack of motivation or enjoyment of activities that you previously enjoyed.  Frequent crying spells.  Talk with your health care provider if you think that you are experiencing depression. What should I know about immunizations? It is important that you get and maintain your immunizations. These include:  Tetanus, diphtheria, and pertussis (Tdap) booster vaccine.  Influenza every year before the flu season begins.  Pneumonia vaccine.  Shingles vaccine.  Your health care provider may also recommend other immunizations. This information is not intended to replace advice given to you by your health care provider. Make sure you discuss any questions you have with your health care provider. Document Released: 03/10/2005 Document Revised: 08/06/2015 Document Reviewed: 10/20/2014 Elsevier Interactive Patient Education  2018 [employer].

## 2016-09-14 DIAGNOSIS — Z1211 Encounter for screening for malignant neoplasm of colon: Secondary | ICD-10-CM | POA: Diagnosis not present

## 2016-09-16 LAB — FECAL OCCULT BLOOD, IMMUNOCHEMICAL: Fecal Occult Bld: NEGATIVE

## 2016-12-08 ENCOUNTER — Other Ambulatory Visit: Payer: Self-pay

## 2016-12-08 MED ORDER — ESTRADIOL 0.5 MG PO TABS: 0.5000 mg | ORAL_TABLET | Freq: Every day | ORAL | 2 refills | Status: DC

## 2017-03-14 ENCOUNTER — Other Ambulatory Visit: Payer: Self-pay | Admitting: Obstetrics and Gynecology

## 2017-03-14 DIAGNOSIS — Z1231 Encounter for screening mammogram for malignant neoplasm of breast: Secondary | ICD-10-CM

## 2017-04-12 ENCOUNTER — Ambulatory Visit
Admission: RE | Admit: 2017-04-12 | Discharge: 2017-04-12 | Disposition: A | Discharge status: Home / Self Care | Payer: BLUE CROSS/BLUE SHIELD | Source: Ambulatory Visit | Attending: Obstetrics and Gynecology | Admitting: Obstetrics and Gynecology

## 2017-04-12 DIAGNOSIS — R928 Other abnormal and inconclusive findings on diagnostic imaging of breast: Secondary | ICD-10-CM | POA: Diagnosis not present

## 2017-04-12 DIAGNOSIS — Z1231 Encounter for screening mammogram for malignant neoplasm of breast: Secondary | ICD-10-CM | POA: Diagnosis not present

## 2017-04-13 ENCOUNTER — Other Ambulatory Visit: Payer: Self-pay | Admitting: Obstetrics and Gynecology

## 2017-04-13 DIAGNOSIS — N6320 Unspecified lump in the left breast, unspecified quadrant: Secondary | ICD-10-CM

## 2017-04-13 DIAGNOSIS — R928 Other abnormal and inconclusive findings on diagnostic imaging of breast: Secondary | ICD-10-CM

## 2017-04-23 ENCOUNTER — Ambulatory Visit
Admission: RE | Admit: 2017-04-23 | Discharge: 2017-04-23 | Disposition: A | Discharge status: Home / Self Care | Payer: BLUE CROSS/BLUE SHIELD | Source: Ambulatory Visit | Attending: Obstetrics and Gynecology | Admitting: Obstetrics and Gynecology

## 2017-04-23 ENCOUNTER — Other Ambulatory Visit: Payer: Self-pay | Admitting: Obstetrics and Gynecology

## 2017-04-23 DIAGNOSIS — N6320 Unspecified lump in the left breast, unspecified quadrant: Secondary | ICD-10-CM

## 2017-04-23 DIAGNOSIS — N6321 Unspecified lump in the left breast, upper outer quadrant: Secondary | ICD-10-CM | POA: Diagnosis not present

## 2017-04-23 DIAGNOSIS — R928 Other abnormal and inconclusive findings on diagnostic imaging of breast: Secondary | ICD-10-CM

## 2017-05-01 ENCOUNTER — Ambulatory Visit
Admission: RE | Admit: 2017-05-01 | Discharge: 2017-05-01 | Disposition: A | Discharge status: Home / Self Care | Payer: BLUE CROSS/BLUE SHIELD | Source: Ambulatory Visit | Attending: Obstetrics and Gynecology | Admitting: Obstetrics and Gynecology

## 2017-05-01 ENCOUNTER — Other Ambulatory Visit: Payer: Self-pay | Admitting: Obstetrics and Gynecology

## 2017-05-01 DIAGNOSIS — D0512 Intraductal carcinoma in situ of left breast: Secondary | ICD-10-CM | POA: Diagnosis not present

## 2017-05-01 DIAGNOSIS — R928 Other abnormal and inconclusive findings on diagnostic imaging of breast: Secondary | ICD-10-CM

## 2017-05-01 DIAGNOSIS — N6320 Unspecified lump in the left breast, unspecified quadrant: Secondary | ICD-10-CM

## 2017-05-01 DIAGNOSIS — C50412 Malignant neoplasm of upper-outer quadrant of left female breast: Secondary | ICD-10-CM | POA: Insufficient documentation

## 2017-05-01 DIAGNOSIS — N6321 Unspecified lump in the left breast, upper outer quadrant: Secondary | ICD-10-CM | POA: Diagnosis not present

## 2017-05-01 HISTORY — PX: BREAST BIOPSY: SHX20

## 2017-05-03 ENCOUNTER — Other Ambulatory Visit: Payer: Self-pay | Admitting: Anatomic Pathology & Clinical Pathology

## 2017-05-03 LAB — SURGICAL PATHOLOGY

## 2017-05-04 ENCOUNTER — Encounter: Payer: Self-pay | Admitting: *Deleted

## 2017-05-04 NOTE — Progress Notes (Signed)
  Oncology Nurse Navigator Documentation  Navigator Location: [facility] (05/04/17 1000) Referral date to RadOnc/MedOnc: 05/14/17 (05/04/17 1000) )Navigator Encounter Type: Introductory phone call (05/04/17 1000)   Abnormal Finding Date: 04/23/17 (05/04/17 1000) Confirmed Diagnosis Date: 05/02/17 (05/04/17 1000)                   Barriers/Navigation Needs: Education;Coordination of Care (05/04/17 1000) Education: Newly Diagnosed Cancer Education (05/04/17 1000) Interventions: Coordination of Care (05/04/17 1000)   Coordination of Care: Appts (05/04/17 1000)                  Time Spent with Patient: 60 (05/04/17 1000)   Patient is newly diagnosed with left invasive breast cancer.  I called her to establish navigation services.  She states she would like to see Dr. Bary Castilla for surgical consult and Dr. Mike Gip for medical oncology.  States Dr. Mike Gip treated her dad.  Patient has been scheduled for a 3rd biopsy on 05/10/17 and would like to wait until after her biopsy for her consults.  Patient is scheduled to see Dr. Bary Castilla on 05/15/17 @ 4:30 and with Dr. Mike Gip on 05/14/17 @ 1:30.  She is going to pick up her educational material on Tuesday here at the cancer center.  She is to call if she has any questions or needs.

## 2017-05-10 ENCOUNTER — Ambulatory Visit
Admission: RE | Admit: 2017-05-10 | Discharge: 2017-05-10 | Disposition: A | Discharge status: Home / Self Care | Payer: BLUE CROSS/BLUE SHIELD | Source: Ambulatory Visit | Attending: Obstetrics and Gynecology | Admitting: Obstetrics and Gynecology

## 2017-05-10 DIAGNOSIS — N6012 Diffuse cystic mastopathy of left breast: Secondary | ICD-10-CM | POA: Diagnosis not present

## 2017-05-10 DIAGNOSIS — N6321 Unspecified lump in the left breast, upper outer quadrant: Secondary | ICD-10-CM | POA: Insufficient documentation

## 2017-05-10 DIAGNOSIS — N6320 Unspecified lump in the left breast, unspecified quadrant: Secondary | ICD-10-CM

## 2017-05-10 DIAGNOSIS — R928 Other abnormal and inconclusive findings on diagnostic imaging of breast: Secondary | ICD-10-CM | POA: Diagnosis not present

## 2017-05-10 DIAGNOSIS — N62 Hypertrophy of breast: Secondary | ICD-10-CM | POA: Diagnosis not present

## 2017-05-10 HISTORY — PX: BREAST BIOPSY: SHX20

## 2017-05-13 DIAGNOSIS — C50412 Malignant neoplasm of upper-outer quadrant of left female breast: Secondary | ICD-10-CM | POA: Insufficient documentation
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
## 2017-05-14 ENCOUNTER — Other Ambulatory Visit: Payer: Self-pay

## 2017-05-14 ENCOUNTER — Encounter: Payer: Self-pay | Admitting: *Deleted

## 2017-05-14 ENCOUNTER — Encounter: Payer: Self-pay | Admitting: Hematology and Oncology

## 2017-05-14 ENCOUNTER — Inpatient Hospital Stay: Payer: BLUE CROSS/BLUE SHIELD

## 2017-05-14 ENCOUNTER — Inpatient Hospital Stay: Payer: BLUE CROSS/BLUE SHIELD | Attending: Hematology and Oncology | Admitting: Hematology and Oncology

## 2017-05-14 VITALS — BP 143/84 | HR 77 | Temp 98.0°F | Resp 18 | Ht 63.78 in | Wt 157.0 lb

## 2017-05-14 DIAGNOSIS — C50412 Malignant neoplasm of upper-outer quadrant of left female breast: Secondary | ICD-10-CM | POA: Diagnosis not present

## 2017-05-14 DIAGNOSIS — Z800 Family history of malignant neoplasm of digestive organs: Secondary | ICD-10-CM | POA: Insufficient documentation

## 2017-05-14 DIAGNOSIS — M8580 Other specified disorders of bone density and structure, unspecified site: Secondary | ICD-10-CM | POA: Insufficient documentation

## 2017-05-14 DIAGNOSIS — Z803 Family history of malignant neoplasm of breast: Secondary | ICD-10-CM | POA: Insufficient documentation

## 2017-05-14 DIAGNOSIS — Z170 Estrogen receptor positive status [ER+]: Secondary | ICD-10-CM | POA: Diagnosis not present

## 2017-05-14 DIAGNOSIS — Z90710 Acquired absence of both cervix and uterus: Secondary | ICD-10-CM | POA: Diagnosis not present

## 2017-05-14 DIAGNOSIS — Z7189 Other specified counseling: Secondary | ICD-10-CM

## 2017-05-14 LAB — SURGICAL PATHOLOGY

## 2017-05-14 NOTE — Progress Notes (Signed)
Here for new pt evaluation.  

## 2017-05-15 ENCOUNTER — Ambulatory Visit (INDEPENDENT_AMBULATORY_CARE_PROVIDER_SITE_OTHER): Payer: BLUE CROSS/BLUE SHIELD | Admitting: General Surgery

## 2017-05-15 ENCOUNTER — Encounter: Payer: Self-pay | Admitting: *Deleted

## 2017-05-15 VITALS — BP 118/68 | HR 71 | Resp 12 | Ht 63.0 in | Wt 158.0 lb

## 2017-05-15 DIAGNOSIS — C50412 Malignant neoplasm of upper-outer quadrant of left female breast: Secondary | ICD-10-CM | POA: Diagnosis not present

## 2017-05-15 DIAGNOSIS — Z170 Estrogen receptor positive status [ER+]: Secondary | ICD-10-CM

## 2017-05-15 LAB — CANCER ANTIGEN 27.29: CA 27.29: 21.1 U/mL (ref 0.0–38.6)

## 2017-05-15 NOTE — Progress Notes (Signed)
  Oncology Nurse Navigator Documentation  Navigator Location: [facility] (05/15/17 0900) Referral date to RadOnc/MedOnc: 05/14/17 (05/15/17 0900) )Navigator Encounter Type: Initial MedOnc (05/15/17 0900)                     Patient Visit Type: MedOnc (05/15/17 0900) Treatment Phase: Pre-Tx/Tx Discussion (05/15/17 0900)                            Time Spent with Patient: 60 (05/15/17 0900)   Met patient and her daughter Maria Barnes Monday during her initial medical oncology consult with Dr. Mike Gip.  Patient's dad was also treated by Dr. Mike Gip for esophageal cancer.  Dr. Mike Gip discussed need for surgery, with Oncotype DX testing and genetic testing due to her significant family history of breast cancer.  She is to call if she has any questions or needs.

## 2017-05-15 NOTE — Patient Instructions (Addendum)
The patient is aware to call back for any questions or new concerns.     May continue estradiol for now, if desires.  The patient is scheduled for surgery at [facility] 05/30/17. She will pre admit by phone. [facility] her [facility] at 8:00 am.
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
## 2017-05-16 ENCOUNTER — Inpatient Hospital Stay: Payer: BLUE CROSS/BLUE SHIELD

## 2017-05-16 ENCOUNTER — Other Ambulatory Visit: Payer: Self-pay | Admitting: General Surgery

## 2017-05-16 ENCOUNTER — Encounter: Payer: Self-pay | Admitting: *Deleted

## 2017-05-16 DIAGNOSIS — Z170 Estrogen receptor positive status [ER+]: Principal | ICD-10-CM

## 2017-05-16 DIAGNOSIS — C50412 Malignant neoplasm of upper-outer quadrant of left female breast: Secondary | ICD-10-CM

## 2017-05-16 MED ORDER — LIDOCAINE-PRILOCAINE 2.5-2.5 % EX CREA: 1.0000 "application " | TOPICAL_CREAM | CUTANEOUS | 0 refills | Status: DC | PRN

## 2017-05-18 ENCOUNTER — Encounter: Payer: Self-pay | Admitting: Hematology and Oncology

## 2017-05-18 DIAGNOSIS — Z7189 Other specified counseling: Secondary | ICD-10-CM | POA: Insufficient documentation

## 2017-05-21 ENCOUNTER — Ambulatory Visit
Admission: RE | Admit: 2017-05-21 | Discharge: 2017-05-21 | Disposition: A | Discharge status: Home / Self Care | Payer: BLUE CROSS/BLUE SHIELD | Source: Ambulatory Visit | Attending: Hematology and Oncology | Admitting: Hematology and Oncology

## 2017-05-21 DIAGNOSIS — Z780 Asymptomatic menopausal state: Secondary | ICD-10-CM | POA: Diagnosis not present

## 2017-05-21 DIAGNOSIS — M8580 Other specified disorders of bone density and structure, unspecified site: Secondary | ICD-10-CM | POA: Diagnosis not present

## 2017-05-21 DIAGNOSIS — Z13820 Encounter for screening for osteoporosis: Secondary | ICD-10-CM | POA: Diagnosis not present

## 2017-05-23 ENCOUNTER — Encounter
Admission: RE | Admit: 2017-05-23 | Discharge: 2017-05-23 | Disposition: A | Discharge status: Home / Self Care | Payer: BLUE CROSS/BLUE SHIELD | Source: Ambulatory Visit | Attending: General Surgery | Admitting: General Surgery

## 2017-05-23 ENCOUNTER — Other Ambulatory Visit: Payer: Self-pay

## 2017-05-23 HISTORY — DX: Personal history of other diseases of the digestive system: Z87.19

## 2017-05-23 HISTORY — DX: Nausea with vomiting, unspecified: R11.2

## 2017-05-23 HISTORY — DX: Adverse effect of unspecified anesthetic, initial encounter: T41.45XA

## 2017-05-23 HISTORY — DX: Other complications of anesthesia, initial encounter: T88.59XA

## 2017-05-23 HISTORY — DX: Family history of other specified conditions: Z84.89

## 2017-05-23 HISTORY — DX: Other specified postprocedural states: Z98.890

## 2017-05-23 NOTE — Patient Instructions (Signed)
Your procedure is scheduled on: 05-30-17 [facility] Report to [place] @ 8 AM  Remember: Instructions that are not followed completely may result in serious medical risk, up to and including death, or upon the discretion of your surgeon and anesthesiologist your surgery may need to be rescheduled.    _x___ 1. Do not eat food after midnight the night before your procedure. NO GUM OR CANDY AFTER MIDNIGHT.  You may drink clear liquids up to 2 hours before you are scheduled to arrive at the hospital for your procedure.  Do not drink clear liquids within 2 hours of your scheduled arrival to the hospital.  Clear liquids include  --Water or Apple juice without pulp  --Clear carbohydrate beverage such as ClearFast or Gatorade  --Black Coffee or Clear Tea (No milk, no creamers, do not add anything to the coffee or Tea     __x__ 2. No Alcohol for 24 hours before or after surgery.   __x__3. No Smoking or e-cigarettes for 24 prior to surgery.  Do not use any chewable tobacco products for at least 6 hour prior to surgery   ____  4. Bring all medications with you on the day of surgery if instructed.    __x__ 5. Notify your doctor if there is any change in your medical condition     (cold, fever, infections).    x___6. On the morning of surgery brush your teeth with toothpaste and water.  You may rinse your mouth with mouth wash if you wish.  Do not swallow any toothpaste or mouthwash.   Do not wear jewelry, make-up, hairpins, clips or nail polish.  Do not wear lotions, powders, or perfumes. You may wear deodorant.  Do not shave 48 hours prior to surgery. Men may shave face and neck.  Do not bring valuables to the hospital.    [facility] is not responsible for any belongings or valuables.               Contacts, dentures or bridgework may not be worn into surgery.  Leave your suitcase in the car. After surgery it may be brought to your room.  For patients admitted to the hospital,  discharge time is determined by your treatment team.  _  Patients discharged the day of surgery will not be allowed to drive home.  You will need someone to drive you home and stay with you the night of your procedure.    Please read over the following fact sheets that you were given:   [facility] Preparing for Surgery and or MRSA Information   _x___ TAKE THE FOLLOWING MEDICATION THE MORNING OF SURGERY . These include:  1. METOPROLOL  2. PRILOSEC  3. APPLY EMLA CREAM TO AREOLA 1 HOUR PRIOR TO ARRIVAL TIME TO HOSPITAL PER DR BYRNETT'S INSTRUCTIONS  4.  5.  6.  ____Fleets enema or Magnesium Citrate as directed.   _x___ Use CHG Soap or sage wipes as directed on instruction sheet   ____ Use inhalers on the day of surgery and bring to hospital day of surgery  ____ Stop Metformin and Janumet 2 days prior to surgery.    ____ Take 1/2 of usual insulin dose the night before surgery and none on the morning surgery.   ____ Follow recommendations from Cardiologist, Pulmonologist or PCP regarding stopping Aspirin, Coumadin, Plavix ,Eliquis, Effient, or Pradaxa, and Pletal.  ____Stop Anti-inflammatories such as Advil, Aleve, Ibuprofen, Motrin, Naproxen, Naprosyn, Goodies powders or aspirin products. OK to take Tylenol  _x___ Stop supplements until after surgery-STOP GLUCOSAMINE NOW-MAY RESUME AFTER SURGERY   ____ Bring C-Pap to the hospital.

## 2017-05-24 ENCOUNTER — Encounter
Admission: RE | Admit: 2017-05-24 | Discharge: 2017-05-24 | Disposition: A | Discharge status: Home / Self Care | Payer: BLUE CROSS/BLUE SHIELD | Source: Ambulatory Visit | Attending: General Surgery | Admitting: General Surgery

## 2017-05-24 DIAGNOSIS — I10 Essential (primary) hypertension: Secondary | ICD-10-CM | POA: Diagnosis not present

## 2017-05-24 DIAGNOSIS — I471 Supraventricular tachycardia: Secondary | ICD-10-CM | POA: Diagnosis not present

## 2017-05-30 ENCOUNTER — Ambulatory Visit
Admission: RE | Admit: 2017-05-30 | Discharge: 2017-05-30 | Disposition: A | Discharge status: Home / Self Care | Payer: BLUE CROSS/BLUE SHIELD | Source: Ambulatory Visit | Attending: General Surgery | Admitting: General Surgery

## 2017-05-30 ENCOUNTER — Ambulatory Visit: Payer: BLUE CROSS/BLUE SHIELD | Admitting: Registered Nurse

## 2017-05-30 ENCOUNTER — Encounter
Admission: RE | Admit: 2017-05-30 | Discharge: 2017-05-30 | Disposition: A | Discharge status: Home / Self Care | Payer: BLUE CROSS/BLUE SHIELD | Source: Ambulatory Visit | Attending: General Surgery | Admitting: General Surgery

## 2017-05-30 ENCOUNTER — Encounter: Payer: Self-pay | Admitting: *Deleted

## 2017-05-30 ENCOUNTER — Encounter
Admission: RE | Disposition: A | Discharge status: Home / Self Care | Payer: Self-pay | Source: Ambulatory Visit | Attending: General Surgery

## 2017-05-30 DIAGNOSIS — C50412 Malignant neoplasm of upper-outer quadrant of left female breast: Secondary | ICD-10-CM

## 2017-05-30 DIAGNOSIS — Z79890 Hormone replacement therapy (postmenopausal): Secondary | ICD-10-CM | POA: Insufficient documentation

## 2017-05-30 DIAGNOSIS — I471 Supraventricular tachycardia: Secondary | ICD-10-CM | POA: Diagnosis not present

## 2017-05-30 DIAGNOSIS — Z170 Estrogen receptor positive status [ER+]: Principal | ICD-10-CM

## 2017-05-30 DIAGNOSIS — Z79899 Other long term (current) drug therapy: Secondary | ICD-10-CM | POA: Diagnosis not present

## 2017-05-30 DIAGNOSIS — Z86010 Personal history of colonic polyps: Secondary | ICD-10-CM | POA: Insufficient documentation

## 2017-05-30 DIAGNOSIS — C50912 Malignant neoplasm of unspecified site of left female breast: Secondary | ICD-10-CM | POA: Diagnosis not present

## 2017-05-30 DIAGNOSIS — D0512 Intraductal carcinoma in situ of left breast: Secondary | ICD-10-CM | POA: Diagnosis not present

## 2017-05-30 DIAGNOSIS — Z8619 Personal history of other infectious and parasitic diseases: Secondary | ICD-10-CM | POA: Insufficient documentation

## 2017-05-30 DIAGNOSIS — I10 Essential (primary) hypertension: Secondary | ICD-10-CM | POA: Insufficient documentation

## 2017-05-30 DIAGNOSIS — K219 Gastro-esophageal reflux disease without esophagitis: Secondary | ICD-10-CM | POA: Diagnosis not present

## 2017-05-30 DIAGNOSIS — K449 Diaphragmatic hernia without obstruction or gangrene: Secondary | ICD-10-CM | POA: Diagnosis not present

## 2017-05-30 HISTORY — PX: BREAST LUMPECTOMY WITH SENTINEL LYMPH NODE BIOPSY: SHX5597

## 2017-05-30 HISTORY — PX: BREAST LUMPECTOMY: SHX2

## 2017-05-30 HISTORY — DX: Malignant neoplasm of upper-outer quadrant of left female breast: C50.412

## 2017-05-30 SURGERY — BREAST LUMPECTOMY WITH SENTINEL LYMPH NODE BX
Anesthesia: General | Site: Breast | Laterality: Left | Wound class: Clean

## 2017-05-30 MED ORDER — SUCCINYLCHOLINE CHLORIDE 20 MG/ML IJ SOLN
INTRAMUSCULAR | Status: DC | PRN
  Administered 2017-05-30: 140 mg via INTRAVENOUS

## 2017-05-30 MED ORDER — LIDOCAINE HCL (PF) 2 % IJ SOLN
INTRAMUSCULAR | Status: AC
  Filled 2017-05-30: qty 10

## 2017-05-30 MED ORDER — FAMOTIDINE 20 MG PO TABS
20.0000 mg | ORAL_TABLET | Freq: Once | ORAL | Status: AC
  Administered 2017-05-30: 20 mg via ORAL

## 2017-05-30 MED ORDER — ONDANSETRON HCL 4 MG/2ML IJ SOLN
INTRAMUSCULAR | Status: AC
  Filled 2017-05-30: qty 2

## 2017-05-30 MED ORDER — BUPIVACAINE-EPINEPHRINE (PF) 0.5% -1:200000 IJ SOLN
INTRAMUSCULAR | Status: DC | PRN
  Administered 2017-05-30: 20 mL

## 2017-05-30 MED ORDER — HYDROCODONE-ACETAMINOPHEN 5-325 MG PO TABS: 1.0000 | ORAL_TABLET | ORAL | 0 refills | Status: DC | PRN

## 2017-05-30 MED ORDER — CELECOXIB 200 MG PO CAPS
200.0000 mg | ORAL_CAPSULE | ORAL | Status: AC
  Administered 2017-05-30: 200 mg via ORAL

## 2017-05-30 MED ORDER — PROPOFOL 10 MG/ML IV BOLUS
INTRAVENOUS | Status: AC
  Filled 2017-05-30: qty 20

## 2017-05-30 MED ORDER — ACETAMINOPHEN NICU IV SYRINGE 10 MG/ML
INTRAVENOUS | Status: AC
  Filled 2017-05-30: qty 1

## 2017-05-30 MED ORDER — PHENYLEPHRINE HCL 10 MG/ML IJ SOLN
INTRAMUSCULAR | Status: DC | PRN
  Administered 2017-05-30 (×2): 100 ug via INTRAVENOUS

## 2017-05-30 MED ORDER — GABAPENTIN 300 MG PO CAPS
ORAL_CAPSULE | ORAL | Status: AC
  Filled 2017-05-30: qty 1

## 2017-05-30 MED ORDER — HYDROCODONE-ACETAMINOPHEN 5-325 MG PO TABS
1.0000 | ORAL_TABLET | Freq: Once | ORAL | Status: AC
  Administered 2017-05-30: 1 via ORAL

## 2017-05-30 MED ORDER — LACTATED RINGERS IV SOLN
INTRAVENOUS | Status: DC
  Administered 2017-05-30 (×2): via INTRAVENOUS

## 2017-05-30 MED ORDER — ACETAMINOPHEN 10 MG/ML IV SOLN
INTRAVENOUS | Status: DC | PRN
  Administered 2017-05-30: 1000 mg via INTRAVENOUS

## 2017-05-30 MED ORDER — DEXAMETHASONE SODIUM PHOSPHATE 10 MG/ML IJ SOLN
INTRAMUSCULAR | Status: DC | PRN
  Administered 2017-05-30: 10 mg via INTRAVENOUS

## 2017-05-30 MED ORDER — CELECOXIB 200 MG PO CAPS
ORAL_CAPSULE | ORAL | Status: AC
  Filled 2017-05-30: qty 1

## 2017-05-30 MED ORDER — LIDOCAINE HCL (CARDIAC) PF 100 MG/5ML IV SOSY
PREFILLED_SYRINGE | INTRAVENOUS | Status: DC | PRN
  Administered 2017-05-30: 40 mg via INTRAVENOUS

## 2017-05-30 MED ORDER — ROCURONIUM BROMIDE 100 MG/10ML IV SOLN
INTRAVENOUS | Status: DC | PRN
  Administered 2017-05-30: 5 mg via INTRAVENOUS

## 2017-05-30 MED ORDER — SUGAMMADEX SODIUM 200 MG/2ML IV SOLN
INTRAVENOUS | Status: AC
  Filled 2017-05-30: qty 2

## 2017-05-30 MED ORDER — GLYCOPYRROLATE 0.2 MG/ML IJ SOLN
INTRAMUSCULAR | Status: DC | PRN
  Administered 2017-05-30: 0.1 mg via INTRAVENOUS

## 2017-05-30 MED ORDER — ONDANSETRON HCL 4 MG/2ML IJ SOLN
INTRAMUSCULAR | Status: DC | PRN
  Administered 2017-05-30: 4 mg via INTRAVENOUS

## 2017-05-30 MED ORDER — ONDANSETRON HCL 4 MG/2ML IJ SOLN: 4.0000 mg | Freq: Once | INTRAMUSCULAR | Status: DC | PRN

## 2017-05-30 MED ORDER — HYDROMORPHONE HCL 1 MG/ML IJ SOLN
INTRAMUSCULAR | Status: DC | PRN
  Administered 2017-05-30: 1 mg via INTRAVENOUS

## 2017-05-30 MED ORDER — FAMOTIDINE 20 MG PO TABS
ORAL_TABLET | ORAL | Status: AC
  Administered 2017-05-30: 20 mg via ORAL
  Filled 2017-05-30: qty 1

## 2017-05-30 MED ORDER — HYDROMORPHONE HCL 1 MG/ML IJ SOLN
INTRAMUSCULAR | Status: AC
  Filled 2017-05-30: qty 1

## 2017-05-30 MED ORDER — KETOROLAC TROMETHAMINE 30 MG/ML IJ SOLN
INTRAMUSCULAR | Status: AC
  Filled 2017-05-30: qty 1

## 2017-05-30 MED ORDER — DEXAMETHASONE SODIUM PHOSPHATE 10 MG/ML IJ SOLN
INTRAMUSCULAR | Status: AC
  Filled 2017-05-30: qty 1

## 2017-05-30 MED ORDER — TECHNETIUM TC 99M SULFUR COLLOID FILTERED
1.0000 | Freq: Once | INTRAVENOUS | Status: AC | PRN
  Administered 2017-05-30: 0.773 via INTRADERMAL

## 2017-05-30 MED ORDER — BUPIVACAINE-EPINEPHRINE (PF) 0.5% -1:200000 IJ SOLN
INTRAMUSCULAR | Status: AC
  Filled 2017-05-30: qty 30

## 2017-05-30 MED ORDER — IPRATROPIUM-ALBUTEROL 0.5-2.5 (3) MG/3ML IN SOLN
3.0000 mL | Freq: Once | RESPIRATORY_TRACT | Status: AC
  Administered 2017-05-30: 3 mL via RESPIRATORY_TRACT

## 2017-05-30 MED ORDER — GLYCOPYRROLATE 0.2 MG/ML IJ SOLN
INTRAMUSCULAR | Status: AC
  Filled 2017-05-30: qty 1

## 2017-05-30 MED ORDER — MIDAZOLAM HCL 2 MG/2ML IJ SOLN
INTRAMUSCULAR | Status: DC | PRN
  Administered 2017-05-30: 4 mg via INTRAVENOUS

## 2017-05-30 MED ORDER — METHYLENE BLUE 0.5 % INJ SOLN
INTRAVENOUS | Status: DC | PRN
  Administered 2017-05-30: 5 mL via SUBMUCOSAL

## 2017-05-30 MED ORDER — IPRATROPIUM-ALBUTEROL 0.5-2.5 (3) MG/3ML IN SOLN
RESPIRATORY_TRACT | Status: AC
  Filled 2017-05-30: qty 3

## 2017-05-30 MED ORDER — FENTANYL CITRATE (PF) 100 MCG/2ML IJ SOLN: 25.0000 ug | INTRAMUSCULAR | Status: DC | PRN

## 2017-05-30 MED ORDER — ROCURONIUM BROMIDE 50 MG/5ML IV SOLN
INTRAVENOUS | Status: AC
  Filled 2017-05-30: qty 1

## 2017-05-30 MED ORDER — GABAPENTIN 300 MG PO CAPS
300.0000 mg | ORAL_CAPSULE | ORAL | Status: AC
  Administered 2017-05-30: 300 mg via ORAL

## 2017-05-30 MED ORDER — METHYLENE BLUE 1 % INJ SOLN
INTRAMUSCULAR | Status: AC
  Filled 2017-05-30: qty 10

## 2017-05-30 MED ORDER — HYDROCODONE-ACETAMINOPHEN 5-325 MG PO TABS
ORAL_TABLET | ORAL | Status: AC
  Filled 2017-05-30: qty 1

## 2017-05-30 MED ORDER — MIDAZOLAM HCL 2 MG/2ML IJ SOLN
INTRAMUSCULAR | Status: AC
  Filled 2017-05-30: qty 4

## 2017-05-30 MED ORDER — PROPOFOL 10 MG/ML IV BOLUS
INTRAVENOUS | Status: DC | PRN
  Administered 2017-05-30: 50 mg via INTRAVENOUS
  Administered 2017-05-30: 150 mg via INTRAVENOUS

## 2017-05-30 SURGICAL SUPPLY — 54 items
BINDER BREAST LRG (GAUZE/BANDAGES/DRESSINGS) IMPLANT
BINDER BREAST MEDIUM (GAUZE/BANDAGES/DRESSINGS) IMPLANT
BINDER BREAST XLRG (GAUZE/BANDAGES/DRESSINGS) ×2 IMPLANT
BINDER BREAST XXLRG (GAUZE/BANDAGES/DRESSINGS) IMPLANT
BLADE BOVIE TIP EXT 4 (BLADE) ×2 IMPLANT
BLADE SURG 15 STRL SS SAFETY (BLADE) ×4 IMPLANT
BULB RESERV EVAC DRAIN JP 100C (MISCELLANEOUS) IMPLANT
CANISTER SUCT 1200ML W/VALVE (MISCELLANEOUS) ×2 IMPLANT
CHLORAPREP W/TINT 26ML (MISCELLANEOUS) ×2 IMPLANT
CNTNR SPEC 2.5X3XGRAD LEK (MISCELLANEOUS) ×1
CONT SPEC 4OZ STER OR WHT (MISCELLANEOUS) ×1
CONTAINER SPEC 2.5X3XGRAD LEK (MISCELLANEOUS) ×1 IMPLANT
COVER PROBE FLX POLY STRL (MISCELLANEOUS) ×2 IMPLANT
DEVICE DUBIN SPECIMEN MAMMOGRA (MISCELLANEOUS) ×2 IMPLANT
DRAIN CHANNEL JP 15F RND 16 (MISCELLANEOUS) IMPLANT
DRAPE LAPAROTOMY TRNSV 106X77 (MISCELLANEOUS) ×2 IMPLANT
DRSG GAUZE FLUFF 36X18 (GAUZE/BANDAGES/DRESSINGS) ×2 IMPLANT
DRSG TELFA 3X8 NADH (GAUZE/BANDAGES/DRESSINGS) ×2 IMPLANT
ELECT CAUTERY BLADE TIP 2.5 (TIP) ×2
ELECT REM PT RETURN 9FT ADLT (ELECTROSURGICAL) ×2
ELECTRODE CAUTERY BLDE TIP 2.5 (TIP) ×1 IMPLANT
ELECTRODE REM PT RTRN 9FT ADLT (ELECTROSURGICAL) ×1 IMPLANT
GAUZE SPONGE 4X4 12PLY STRL (GAUZE/BANDAGES/DRESSINGS) ×2 IMPLANT
GLOVE BIO SURGEON STRL SZ7.5 (GLOVE) ×4 IMPLANT
GLOVE INDICATOR 8.0 STRL GRN (GLOVE) ×2 IMPLANT
GOWN STRL REUS W/ TWL LRG LVL3 (GOWN DISPOSABLE) ×2 IMPLANT
GOWN STRL REUS W/TWL LRG LVL3 (GOWN DISPOSABLE) ×2
KIT TURNOVER KIT A (KITS) ×2 IMPLANT
LABEL OR SOLS (LABEL) ×2 IMPLANT
MARGIN MAP 10MM (MISCELLANEOUS) ×2 IMPLANT
NEEDLE HYPO 22GX1.5 SAFETY (NEEDLE) ×2 IMPLANT
NEEDLE HYPO 25X1 1.5 SAFETY (NEEDLE) ×4 IMPLANT
PACK BASIN MINOR ARMC (MISCELLANEOUS) ×2 IMPLANT
RETRACTOR RING XSMALL (MISCELLANEOUS) ×1 IMPLANT
RTRCTR WOUND ALEXIS 13CM XS SH (MISCELLANEOUS) ×2
SHEARS FOC LG CVD HARMONIC 17C (MISCELLANEOUS) IMPLANT
SHEARS HARMONIC 9CM CVD (BLADE) ×2 IMPLANT
SLEVE PROBE SENORX GAMMA FIND (MISCELLANEOUS) ×2 IMPLANT
STRIP CLOSURE SKIN 1/2X4 (GAUZE/BANDAGES/DRESSINGS) ×2 IMPLANT
SUT ETHILON 3-0 FS-10 30 BLK (SUTURE) ×2
SUT SILK 2 0 (SUTURE) ×1
SUT SILK 2-0 18XBRD TIE 12 (SUTURE) ×1 IMPLANT
SUT VIC AB 2-0 CT1 27 (SUTURE) ×1
SUT VIC AB 2-0 CT1 TAPERPNT 27 (SUTURE) ×1 IMPLANT
SUT VIC AB 3-0 SH 27 (SUTURE) ×1
SUT VIC AB 3-0 SH 27X BRD (SUTURE) ×1 IMPLANT
SUT VIC AB 4-0 FS2 27 (SUTURE) ×2 IMPLANT
SUT VICRYL+ 3-0 144IN (SUTURE) ×2 IMPLANT
SUTURE EHLN 3-0 FS-10 30 BLK (SUTURE) ×1 IMPLANT
SWABSTK COMLB BENZOIN TINCTURE (MISCELLANEOUS) ×2 IMPLANT
SYR 10ML LL (SYRINGE) ×2 IMPLANT
SYR BULB IRRIG 60ML STRL (SYRINGE) ×2 IMPLANT
TAPE TRANSPORE STRL 2 31045 (GAUZE/BANDAGES/DRESSINGS) ×4 IMPLANT
WATER STERILE IRR 1000ML POUR (IV SOLUTION) ×2 IMPLANT

## 2017-05-30 NOTE — Transfer of Care (Signed)
Immediate Anesthesia Transfer of Care Note  Patient: Maria Barnes  Procedure(s) Performed: BREAST LUMPECTOMY WITH SENTINEL LYMPH NODE BX,NEEDLE LOCALIZATION (Left Breast)  Patient Location: PACU  Anesthesia Type:General  Level of Consciousness: drowsy and patient cooperative  Airway & Oxygen Therapy: Patient Spontanous Breathing and Patient connected to face mask oxygen  Post-op Assessment: Report given to RN, Post -op Vital signs reviewed and stable and Patient moving all extremities  Post vital signs: Reviewed and stable  Last Vitals:  Vitals Value Taken Time  BP 110/60 05/30/2017  1:16 PM  Temp 36.2 C 05/30/2017  1:16 PM  Pulse 91 05/30/2017  1:20 PM  Resp 15 05/30/2017  1:20 PM  SpO2 96 % 05/30/2017  1:20 PM  Vitals shown include unvalidated device data.  Last Pain:  Vitals:   05/30/17 1316  PainSc: Asleep         Complications: No apparent anesthesia complications

## 2017-05-30 NOTE — Progress Notes (Signed)
Dr Terri Piedra states pt may be discharged
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
## 2017-05-30 NOTE — Anesthesia Procedure Notes (Signed)
Performed by: Lonnel Gjerde, CRNA       

## 2017-05-30 NOTE — Anesthesia Procedure Notes (Signed)
Procedure Name: Intubation Date/Time: 05/30/2017 11:36 AM Performed by: Nile Riggs, CRNA Pre-anesthesia Checklist: Patient identified, Emergency Drugs available, Suction available, Patient being monitored and Timeout performed Patient Re-evaluated:Patient Re-evaluated prior to induction Oxygen Delivery Method: Circle system utilized Preoxygenation: Pre-oxygenation with 100% oxygen Induction Type: IV induction and Cricoid Pressure applied Ventilation: Mask ventilation without difficulty Laryngoscope Size: Miller and 2 Grade View: Grade II Tube type: Oral Number of attempts: 1 Airway Equipment and Method: Stylet Placement Confirmation: ETT inserted through vocal cords under direct vision,  positive ETCO2,  CO2 detector and breath sounds checked- equal and bilateral Secured at: 21 cm Tube secured with: Tape Dental Injury: Teeth and Oropharynx as per pre-operative assessment  Difficulty Due To: Difficult Airway- due to anterior larynx and Difficulty was anticipated

## 2017-05-30 NOTE — Op Note (Signed)
Preoperative diagnosis: Invasive mammary carcinoma of the left breast.  Postoperative diagnosis: Same.  Operative procedure: Left breast wide excision with wire localization, sentinel node biopsy.  Operating Surgeon: Hervey Ard, MD.  Anesthesia: General endotracheal, Marcaine 0.5% with 1 to 200,000 units of epinephrine, 30 cc.  Estimated blood loss: Less than 5 cc.  Clinical note: This 62 year old woman underwent mammography showing areas of concern and had one biopsy showing invasive mammary carcinoma, second showing DCIS in the area and the third benign biopsy.  She desired breast conservation.  She underwent wire localization this morning.  Sentinel node injection was completed prior to presentation of the operating theater.  Operative note: The patient underwent general endotracheal anesthesia due to a history of significant reflux.  After the induction of anesthesia 5 cc of 0.5% methylene blue was injected in the subareolar plexus.  The breast, axilla and neck was then cleansed with ChloraPrep and draped.  Ultrasound was used to identify the tip of the wire.  A curvilinear incision was made in the upper outer quadrant of the right breast after infiltration of local anesthesia.  The skin was incised sharply and the subcutaneous tissue was divided to allow placement of an extra small Alexis wound protector.  The localizing wires were brought into the field.  Eighth 4 x 4 x 5 cm block of tissue was excised orientated and sent for specimen radiograph.  Intact wire tips, and all 3 previously placed clips were included in the specimen.  Gross examination by pathology showed no evidence of involvement of the margins.  All the breast was being processed attention was turned of the axilla.  This was reached through the superior aspect of the wide excision site.  The first node was hot and blue, the second nodes were hot and not blue.  The most superior node was just below the level of the axillary  vein.  Hemostasis was electrocautery and 3-0 Vicryl ties.  The axillary envelope opening was closed with 2-0 Vicryl figure-of-eight sutures x2.  The upper aspect of the breast parenchyma was approximated with interrupted 2-0 Vicryl sutures.  The adipose layer was approximated a similar fashion.  Subtendinous fat was closed with a running 2-0 Vicryl suture.  The skin was closed with a running 4-0 Vicryl subcuticular suture.  Benzoin Steri-Strips followed by Telfa, fluff gauze and and an elastic binder were applied.  Patient tolerated the procedure well was taken to the recovery room in stable condition.

## 2017-05-30 NOTE — H&P (Signed)
No change in clinical history or exam. For left wide excision, SLN biopsy.

## 2017-05-30 NOTE — Anesthesia Post-op Follow-up Note (Signed)
Anesthesia QCDR form completed.        

## 2017-05-30 NOTE — Discharge Instructions (Signed)
Pt with small chin and anterior larynx. Easy mask, atraumatic DVL x1 with Miller 2 blade.

## 2017-05-31 ENCOUNTER — Encounter: Payer: Self-pay | Admitting: General Surgery

## 2017-05-31 NOTE — Anesthesia Postprocedure Evaluation (Signed)
Anesthesia Post Note  Patient: Maria Barnes  Procedure(s) Performed: BREAST LUMPECTOMY WITH SENTINEL LYMPH NODE BX,NEEDLE LOCALIZATION (Left Breast)  Patient location during evaluation: PACU Anesthesia Type: General Level of consciousness: awake and alert and oriented Pain management: pain level controlled Vital Signs Assessment: post-procedure vital signs reviewed and stable Respiratory status: spontaneous breathing Cardiovascular status: blood pressure returned to baseline Anesthetic complications: no     Last Vitals:  Vitals:   05/30/17 1510 05/30/17 1548  BP: 129/81 (!) 142/58  Pulse: 82 89  Resp: 16   Temp: (!) 35.4 C   SpO2: 97% 95%    Last Pain:  Vitals:   05/30/17 1548  TempSrc:   PainSc: 3                  Siearra Amberg

## 2017-06-01 LAB — SURGICAL PATHOLOGY

## 2017-06-07 ENCOUNTER — Ambulatory Visit (INDEPENDENT_AMBULATORY_CARE_PROVIDER_SITE_OTHER): Payer: BLUE CROSS/BLUE SHIELD | Admitting: General Surgery

## 2017-06-07 ENCOUNTER — Ambulatory Visit: Payer: Self-pay

## 2017-06-07 ENCOUNTER — Encounter: Payer: Self-pay | Admitting: General Surgery

## 2017-06-07 VITALS — BP 130/74 | HR 84 | Resp 14 | Ht 63.0 in | Wt 157.0 lb

## 2017-06-07 DIAGNOSIS — C50412 Malignant neoplasm of upper-outer quadrant of left female breast: Secondary | ICD-10-CM

## 2017-06-07 DIAGNOSIS — N6321 Unspecified lump in the left breast, upper outer quadrant: Secondary | ICD-10-CM

## 2017-06-07 DIAGNOSIS — Z170 Estrogen receptor positive status [ER+]: Secondary | ICD-10-CM
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
## 2017-06-07 NOTE — Patient Instructions (Addendum)
Patient to see Dr. Baruch Gouty . Patient to return in one month. The patient is aware to call back for any questions or concerns.

## 2017-06-10 ENCOUNTER — Encounter: Payer: Self-pay | Admitting: General Surgery

## 2017-06-21 ENCOUNTER — Inpatient Hospital Stay
Admission: EM | Admit: 2017-06-21 | Discharge: 2017-06-23 | DRG: 280 | Disposition: A | Discharge status: Home / Self Care | Payer: BLUE CROSS/BLUE SHIELD | Attending: Internal Medicine | Admitting: Internal Medicine

## 2017-06-21 ENCOUNTER — Other Ambulatory Visit: Payer: Self-pay

## 2017-06-21 ENCOUNTER — Encounter: Payer: Self-pay | Admitting: Radiation Oncology

## 2017-06-21 ENCOUNTER — Ambulatory Visit
Admission: RE | Admit: 2017-06-21 | Discharge: 2017-06-21 | Disposition: A | Discharge status: Home / Self Care | Payer: BLUE CROSS/BLUE SHIELD | Source: Ambulatory Visit | Attending: Radiation Oncology | Admitting: Radiation Oncology

## 2017-06-21 ENCOUNTER — Inpatient Hospital Stay: Payer: BLUE CROSS/BLUE SHIELD | Attending: Hematology and Oncology | Admitting: Hematology and Oncology

## 2017-06-21 ENCOUNTER — Emergency Department: Payer: BLUE CROSS/BLUE SHIELD

## 2017-06-21 ENCOUNTER — Encounter: Payer: Self-pay | Admitting: Emergency Medicine

## 2017-06-21 VITALS — BP 118/58 | HR 82 | Temp 98.3°F | Resp 18 | Wt 162.0 lb

## 2017-06-21 VITALS — BP 137/77 | HR 56 | Temp 97.5°F | Wt 162.0 lb

## 2017-06-21 DIAGNOSIS — Z170 Estrogen receptor positive status [ER+]: Principal | ICD-10-CM

## 2017-06-21 DIAGNOSIS — Z23 Encounter for immunization: Secondary | ICD-10-CM | POA: Diagnosis not present

## 2017-06-21 DIAGNOSIS — R7989 Other specified abnormal findings of blood chemistry: Secondary | ICD-10-CM

## 2017-06-21 DIAGNOSIS — I214 Non-ST elevation (NSTEMI) myocardial infarction: Secondary | ICD-10-CM | POA: Diagnosis not present

## 2017-06-21 DIAGNOSIS — R079 Chest pain, unspecified: Secondary | ICD-10-CM | POA: Diagnosis not present

## 2017-06-21 DIAGNOSIS — Z800 Family history of malignant neoplasm of digestive organs: Secondary | ICD-10-CM

## 2017-06-21 DIAGNOSIS — I471 Supraventricular tachycardia: Secondary | ICD-10-CM

## 2017-06-21 DIAGNOSIS — K219 Gastro-esophageal reflux disease without esophagitis: Secondary | ICD-10-CM | POA: Diagnosis present

## 2017-06-21 DIAGNOSIS — Z803 Family history of malignant neoplasm of breast: Secondary | ICD-10-CM

## 2017-06-21 DIAGNOSIS — I2542 Coronary artery dissection: Secondary | ICD-10-CM

## 2017-06-21 DIAGNOSIS — Z79899 Other long term (current) drug therapy: Secondary | ICD-10-CM

## 2017-06-21 DIAGNOSIS — C50412 Malignant neoplasm of upper-outer quadrant of left female breast: Secondary | ICD-10-CM

## 2017-06-21 DIAGNOSIS — R0789 Other chest pain: Secondary | ICD-10-CM | POA: Diagnosis not present

## 2017-06-21 DIAGNOSIS — K449 Diaphragmatic hernia without obstruction or gangrene: Secondary | ICD-10-CM | POA: Insufficient documentation

## 2017-06-21 DIAGNOSIS — M8580 Other specified disorders of bone density and structure, unspecified site: Secondary | ICD-10-CM

## 2017-06-21 DIAGNOSIS — Z90710 Acquired absence of both cervix and uterus: Secondary | ICD-10-CM

## 2017-06-21 DIAGNOSIS — Z7189 Other specified counseling: Secondary | ICD-10-CM

## 2017-06-21 DIAGNOSIS — Z853 Personal history of malignant neoplasm of breast: Secondary | ICD-10-CM | POA: Diagnosis not present

## 2017-06-21 DIAGNOSIS — R0602 Shortness of breath: Secondary | ICD-10-CM | POA: Diagnosis not present

## 2017-06-21 DIAGNOSIS — C50919 Malignant neoplasm of unspecified site of unspecified female breast: Secondary | ICD-10-CM | POA: Diagnosis not present

## 2017-06-21 DIAGNOSIS — R778 Other specified abnormalities of plasma proteins: Secondary | ICD-10-CM

## 2017-06-21 DIAGNOSIS — I2510 Atherosclerotic heart disease of native coronary artery without angina pectoris: Secondary | ICD-10-CM | POA: Diagnosis not present

## 2017-06-21 DIAGNOSIS — I10 Essential (primary) hypertension: Secondary | ICD-10-CM

## 2017-06-21 DIAGNOSIS — Z8249 Family history of ischemic heart disease and other diseases of the circulatory system: Secondary | ICD-10-CM | POA: Diagnosis not present

## 2017-06-21 DIAGNOSIS — N893 Dysplasia of vagina, unspecified: Secondary | ICD-10-CM

## 2017-06-21 DIAGNOSIS — Z86010 Personal history of colonic polyps: Secondary | ICD-10-CM | POA: Insufficient documentation

## 2017-06-21 DIAGNOSIS — E785 Hyperlipidemia, unspecified: Secondary | ICD-10-CM | POA: Diagnosis not present

## 2017-06-21 HISTORY — DX: Coronary artery dissection: I25.42

## 2017-06-21 LAB — BASIC METABOLIC PANEL
ANION GAP: 8 (ref 5–15)
BUN: 16 mg/dL (ref 6–20)
CO2: 26 mmol/L (ref 22–32)
Calcium: 9.2 mg/dL (ref 8.9–10.3)
Chloride: 104 mmol/L (ref 101–111)
Creatinine, Ser: 0.85 mg/dL (ref 0.44–1.00)
GFR calc Af Amer: >60 mL/min (ref >60)
Glucose, Bld: 153 mg/dL — ABNORMAL HIGH (ref 65–99)
POTASSIUM: 3.7 mmol/L (ref 3.5–5.1)
SODIUM: 138 mmol/L (ref 135–145)

## 2017-06-21 LAB — CBC
HEMATOCRIT: 39.5 % (ref 35.0–47.0)
HEMOGLOBIN: 13.6 g/dL (ref 12.0–16.0)
MCH: 31.9 pg (ref 26.0–34.0)
MCHC: 34.5 g/dL (ref 32.0–36.0)
MCV: 92.7 fL (ref 80.0–100.0)
Platelets: 243 10*3/uL (ref 150–440)
RBC: 4.26 MIL/uL (ref 3.80–5.20)
RDW: 13.3 % (ref 11.5–14.5)
WBC: 6.6 10*3/uL (ref 3.6–11.0)

## 2017-06-21 LAB — TROPONIN I
Troponin I: 0.3 ng/mL (ref <0.03)
Troponin I: 1.49 ng/mL (ref <0.03)

## 2017-06-21 MED ORDER — NITROGLYCERIN 2 % TD OINT
1.0000 [in_us] | TOPICAL_OINTMENT | Freq: Once | TRANSDERMAL | Status: AC
  Administered 2017-06-21: 1 [in_us] via TOPICAL
  Filled 2017-06-21: qty 1

## 2017-06-21 MED ORDER — ASPIRIN 81 MG PO CHEW
324.0000 mg | CHEWABLE_TABLET | Freq: Once | ORAL | Status: AC
  Administered 2017-06-21: 324 mg via ORAL
  Filled 2017-06-21: qty 4

## 2017-06-21 MED ORDER — MORPHINE SULFATE (PF) 2 MG/ML IV SOLN
2.0000 mg | INTRAVENOUS | Status: DC | PRN
Start: 2017-06-21 — End: 2017-06-23

## 2017-06-21 MED ORDER — HEPARIN (PORCINE) IN NACL 100-0.45 UNIT/ML-% IJ SOLN
950.0000 [IU]/h | INTRAMUSCULAR | Status: DC
Start: 2017-06-21 — End: 2017-06-22
  Administered 2017-06-21: 800 [IU]/h via INTRAVENOUS
  Filled 2017-06-21: qty 250

## 2017-06-21 MED ORDER — FAMOTIDINE 20 MG PO TABS
20.0000 mg | ORAL_TABLET | Freq: Every day | ORAL | Status: DC
  Administered 2017-06-21 – 2017-06-23 (×3): 20 mg via ORAL
  Filled 2017-06-21 (×3): qty 1

## 2017-06-21 MED ORDER — METOPROLOL TARTRATE 50 MG PO TABS
50.0000 mg | ORAL_TABLET | Freq: Two times a day (BID) | ORAL | Status: DC
  Administered 2017-06-21 – 2017-06-22 (×2): 50 mg via ORAL
  Filled 2017-06-21 (×2): qty 1

## 2017-06-21 MED ORDER — ADULT MULTIVITAMIN W/MINERALS CH
1.0000 | ORAL_TABLET | Freq: Every day | ORAL | Status: DC
  Administered 2017-06-22 – 2017-06-23 (×2): 1 via ORAL
  Filled 2017-06-21 (×2): qty 1

## 2017-06-21 MED ORDER — ASPIRIN 300 MG RE SUPP: 300.0000 mg | RECTAL | Status: DC

## 2017-06-21 MED ORDER — GI COCKTAIL ~~LOC~~
30.0000 mL | Freq: Once | ORAL | Status: AC
  Administered 2017-06-21: 30 mL via ORAL
  Filled 2017-06-21: qty 30

## 2017-06-21 MED ORDER — IOPAMIDOL (ISOVUE-370) INJECTION 76%
75.0000 mL | Freq: Once | INTRAVENOUS | Status: AC | PRN
  Administered 2017-06-21: 75 mL via INTRAVENOUS

## 2017-06-21 MED ORDER — HEPARIN BOLUS VIA INFUSION
4000.0000 [IU] | Freq: Once | INTRAVENOUS | Status: DC
  Filled 2017-06-21: qty 4000

## 2017-06-21 MED ORDER — NITROGLYCERIN 0.4 MG SL SUBL: 0.4000 mg | SUBLINGUAL_TABLET | SUBLINGUAL | Status: DC | PRN

## 2017-06-21 MED ORDER — PNEUMOCOCCAL VAC POLYVALENT 25 MCG/0.5ML IJ INJ
0.5000 mL | INJECTION | INTRAMUSCULAR | Status: AC
  Administered 2017-06-23: 0.5 mL via INTRAMUSCULAR
  Filled 2017-06-21: qty 0.5

## 2017-06-21 MED ORDER — POLYVINYL ALCOHOL 1.4 % OP SOLN
1.0000 [drp] | Freq: Four times a day (QID) | OPHTHALMIC | Status: DC | PRN
  Filled 2017-06-21: qty 15

## 2017-06-21 MED ORDER — GLUCOSAMINE 500 MG PO CAPS: 500.0000 mg | ORAL_CAPSULE | Freq: Every day | ORAL | Status: DC

## 2017-06-21 MED ORDER — ONDANSETRON HCL 4 MG/2ML IJ SOLN: 4.0000 mg | Freq: Four times a day (QID) | INTRAMUSCULAR | Status: DC | PRN

## 2017-06-21 MED ORDER — ACETAMINOPHEN 500 MG PO TABS
1000.0000 mg | ORAL_TABLET | Freq: Four times a day (QID) | ORAL | Status: DC | PRN
  Administered 2017-06-22 (×2): 1000 mg via ORAL
  Filled 2017-06-21 (×2): qty 2

## 2017-06-21 MED ORDER — PANTOPRAZOLE SODIUM 40 MG PO TBEC
40.0000 mg | DELAYED_RELEASE_TABLET | Freq: Every day | ORAL | Status: DC
  Administered 2017-06-21 – 2017-06-23 (×3): 40 mg via ORAL
  Filled 2017-06-21 (×3): qty 1

## 2017-06-21 MED ORDER — ASPIRIN EC 81 MG PO TBEC
81.0000 mg | DELAYED_RELEASE_TABLET | Freq: Every day | ORAL | Status: DC
  Administered 2017-06-22 – 2017-06-23 (×2): 81 mg via ORAL
  Filled 2017-06-21 (×2): qty 1

## 2017-06-21 MED ORDER — SALINE SPRAY 0.65 % NA SOLN
1.0000 | NASAL | Status: DC | PRN
  Administered 2017-06-21 – 2017-06-22 (×2): 1 via NASAL
  Filled 2017-06-21: qty 44

## 2017-06-21 MED ORDER — VITAMIN C 500 MG PO TABS
500.0000 mg | ORAL_TABLET | Freq: Every day | ORAL | Status: DC
  Administered 2017-06-22 – 2017-06-23 (×2): 500 mg via ORAL
  Filled 2017-06-21 (×2): qty 1

## 2017-06-21 MED ORDER — CHOLECALCIFEROL 10 MCG (400 UNIT) PO TABS
400.0000 [IU] | ORAL_TABLET | Freq: Every day | ORAL | Status: DC
  Administered 2017-06-22 – 2017-06-23 (×2): 400 [IU] via ORAL
  Filled 2017-06-21 (×2): qty 1

## 2017-06-21 MED ORDER — ATORVASTATIN CALCIUM 20 MG PO TABS: 40.0000 mg | ORAL_TABLET | Freq: Every day | ORAL | Status: DC

## 2017-06-21 MED ORDER — HYDROCODONE-ACETAMINOPHEN 5-325 MG PO TABS: 1.0000 | ORAL_TABLET | ORAL | Status: DC | PRN

## 2017-06-21 MED ORDER — ASPIRIN 81 MG PO CHEW: 324.0000 mg | CHEWABLE_TABLET | ORAL | Status: DC
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
## 2017-06-21 NOTE — Progress Notes (Signed)
Patient states she has not felt well since eating a chicken salad sandwich at lunch.  C/o pain in her left neck and across the top part of her left chest.  Pain 4/10.  No changes regarding her breast cancer.
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
## 2017-06-21 NOTE — ED Triage Notes (Signed)
Pt was at the cancer center, and developed chest pain. She states that it was to the left and went up her neck. She did have some SHOB and diaphoresis but denies any nausea. She felt that if she could burp it would help
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
## 2017-06-21 NOTE — Progress Notes (Signed)
PHARMACIST - PHYSICIAN ORDER COMMUNICATION  CONCERNING: P&T Medication Policy on Herbal Medications  DESCRIPTION:  This patient's order for:  glucosamine  has been noted.  This product(s) is classified as an "herbal" or natural product. Due to a lack of definitive safety studies or FDA approval, nonstandard manufacturing practices, plus the potential risk of unknown drug-drug interactions while on inpatient medications, the Pharmacy and Therapeutics Committee does not permit the use of "herbal" or natural products of this type within [place].   ACTION TAKEN: The pharmacy department is unable to verify this order at this time and your patient has been informed of this safety policy. Please reevaluate patient's clinical condition at discharge and address if the herbal or natural product(s) should be resumed at that time.   
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
## 2017-06-22 ENCOUNTER — Encounter: Payer: Self-pay | Admitting: Nurse Practitioner

## 2017-06-22 ENCOUNTER — Encounter
Admission: EM | Disposition: A | Discharge status: Home / Self Care | Payer: Self-pay | Source: Home / Self Care | Attending: Internal Medicine

## 2017-06-22 ENCOUNTER — Inpatient Hospital Stay (HOSPITAL_COMMUNITY)
Admit: 2017-06-22 | Discharge: 2017-06-22 | Disposition: A | Discharge status: Home / Self Care | Payer: BLUE CROSS/BLUE SHIELD | Attending: Internal Medicine | Admitting: Internal Medicine

## 2017-06-22 DIAGNOSIS — R079 Chest pain, unspecified: Secondary | ICD-10-CM

## 2017-06-22 DIAGNOSIS — I214 Non-ST elevation (NSTEMI) myocardial infarction: Secondary | ICD-10-CM

## 2017-06-22 DIAGNOSIS — Z170 Estrogen receptor positive status [ER+]: Secondary | ICD-10-CM

## 2017-06-22 DIAGNOSIS — I2510 Atherosclerotic heart disease of native coronary artery without angina pectoris: Secondary | ICD-10-CM

## 2017-06-22 DIAGNOSIS — I2542 Coronary artery dissection: Secondary | ICD-10-CM

## 2017-06-22 DIAGNOSIS — C50919 Malignant neoplasm of unspecified site of unspecified female breast: Secondary | ICD-10-CM

## 2017-06-22 HISTORY — PX: LEFT HEART CATH AND CORONARY ANGIOGRAPHY: CATH118249

## 2017-06-22 LAB — BASIC METABOLIC PANEL
Anion gap: 6 (ref 5–15)
BUN: 12 mg/dL (ref 6–20)
CHLORIDE: 109 mmol/L (ref 101–111)
CO2: 26 mmol/L (ref 22–32)
CREATININE: 0.71 mg/dL (ref 0.44–1.00)
Calcium: 8.8 mg/dL — ABNORMAL LOW (ref 8.9–10.3)
GFR calc Af Amer: >60 mL/min (ref >60)
GFR calc non Af Amer: >60 mL/min (ref >60)
GLUCOSE: 112 mg/dL — AB (ref 65–99)
POTASSIUM: 3.9 mmol/L (ref 3.5–5.1)
SODIUM: 141 mmol/L (ref 135–145)

## 2017-06-22 LAB — TROPONIN I
TROPONIN I: 0.76 ng/mL — AB (ref <0.03)
Troponin I: 1.28 ng/mL (ref <0.03)

## 2017-06-22 LAB — HEPARIN LEVEL (UNFRACTIONATED)
HEPARIN UNFRACTIONATED: 0.36 [IU]/mL (ref 0.30–0.70)
HEPARIN UNFRACTIONATED: 0.28 [IU]/mL — AB (ref 0.30–0.70)
Heparin Unfractionated: <0.1 IU/mL — ABNORMAL LOW (ref 0.30–0.70)

## 2017-06-22 LAB — CBC
HCT: 38.6 % (ref 35.0–47.0)
Hemoglobin: 13.4 g/dL (ref 12.0–16.0)
MCH: 32 pg (ref 26.0–34.0)
MCHC: 34.8 g/dL (ref 32.0–36.0)
MCV: 92 fL (ref 80.0–100.0)
PLATELETS: 269 10*3/uL (ref 150–440)
RBC: 4.2 MIL/uL (ref 3.80–5.20)
RDW: 13.2 % (ref 11.5–14.5)
WBC: 7.2 10*3/uL (ref 3.6–11.0)

## 2017-06-22 LAB — LIPID PANEL
CHOL/HDL RATIO: 4.7 ratio
CHOLESTEROL: 215 mg/dL — AB (ref 0–200)
HDL: 46 mg/dL (ref >40)
LDL Cholesterol: UNDETERMINED mg/dL (ref 0–99)
TRIGLYCERIDES: 407 mg/dL — AB (ref <150)
VLDL: UNDETERMINED mg/dL (ref 0–40)

## 2017-06-22 LAB — TSH: TSH: 1.27 u[IU]/mL (ref 0.350–4.500)

## 2017-06-22 LAB — ECHOCARDIOGRAM COMPLETE
Height: 63 in
Weight: 2544 oz

## 2017-06-22 SURGERY — LEFT HEART CATH AND CORONARY ANGIOGRAPHY
Anesthesia: Moderate Sedation

## 2017-06-22 MED ORDER — SODIUM CHLORIDE 0.9% FLUSH: 3.0000 mL | Freq: Two times a day (BID) | INTRAVENOUS | Status: DC

## 2017-06-22 MED ORDER — SODIUM CHLORIDE 0.9 % IV SOLN
INTRAVENOUS | Status: AC
  Administered 2017-06-22: 12:00:00 via INTRAVENOUS

## 2017-06-22 MED ORDER — METOPROLOL TARTRATE 50 MG PO TABS
100.0000 mg | ORAL_TABLET | Freq: Two times a day (BID) | ORAL | Status: DC
  Administered 2017-06-22 – 2017-06-23 (×2): 100 mg via ORAL
  Filled 2017-06-22 (×2): qty 2

## 2017-06-22 MED ORDER — HEPARIN BOLUS VIA INFUSION
1000.0000 [IU] | Freq: Once | INTRAVENOUS | Status: AC
  Administered 2017-06-22: 1000 [IU] via INTRAVENOUS
  Filled 2017-06-22: qty 1000

## 2017-06-22 MED ORDER — SODIUM CHLORIDE 0.9 % IV SOLN: 250.0000 mL | INTRAVENOUS | Status: DC | PRN

## 2017-06-22 MED ORDER — ATORVASTATIN CALCIUM 80 MG PO TABS: 80.0000 mg | ORAL_TABLET | Freq: Every day | ORAL | 0 refills | Status: DC

## 2017-06-22 MED ORDER — SODIUM CHLORIDE 0.9 % WEIGHT BASED INFUSION: 3.0000 mL/kg/h | INTRAVENOUS | Status: AC

## 2017-06-22 MED ORDER — SODIUM CHLORIDE 0.9% FLUSH: 3.0000 mL | INTRAVENOUS | Status: DC | PRN

## 2017-06-22 MED ORDER — SODIUM CHLORIDE 0.9 % WEIGHT BASED INFUSION: 1.0000 mL/kg/h | INTRAVENOUS | Status: DC

## 2017-06-22 MED ORDER — PERFLUTREN LIPID MICROSPHERE
1.0000 mL | INTRAVENOUS | Status: AC | PRN
  Administered 2017-06-22: 2 mL via INTRAVENOUS
  Filled 2017-06-22: qty 10

## 2017-06-22 MED ORDER — MIDAZOLAM HCL 2 MG/2ML IJ SOLN
INTRAMUSCULAR | Status: DC | PRN
  Administered 2017-06-22: 1 mg via INTRAVENOUS

## 2017-06-22 MED ORDER — FENTANYL CITRATE (PF) 100 MCG/2ML IJ SOLN
INTRAMUSCULAR | Status: DC | PRN
  Administered 2017-06-22: 25 ug via INTRAVENOUS

## 2017-06-22 MED ORDER — HEPARIN (PORCINE) IN NACL 1000-0.9 UT/500ML-% IV SOLN
INTRAVENOUS | Status: AC
  Filled 2017-06-22: qty 500

## 2017-06-22 MED ORDER — ASPIRIN 81 MG PO TBEC: 81.0000 mg | DELAYED_RELEASE_TABLET | Freq: Every day | ORAL | 0 refills | Status: DC

## 2017-06-22 MED ORDER — ASPIRIN 81 MG PO CHEW: 81.0000 mg | CHEWABLE_TABLET | ORAL | Status: DC

## 2017-06-22 MED ORDER — IOPAMIDOL (ISOVUE-300) INJECTION 61%
INTRAVENOUS | Status: DC | PRN
  Administered 2017-06-22: 80 mL via INTRA_ARTERIAL

## 2017-06-22 MED ORDER — FENTANYL CITRATE (PF) 100 MCG/2ML IJ SOLN
INTRAMUSCULAR | Status: AC
  Filled 2017-06-22: qty 2

## 2017-06-22 MED ORDER — LIDOCAINE HCL (PF) 1 % IJ SOLN
INTRAMUSCULAR | Status: AC
  Filled 2017-06-22: qty 30

## 2017-06-22 MED ORDER — MIDAZOLAM HCL 2 MG/2ML IJ SOLN
INTRAMUSCULAR | Status: AC
  Filled 2017-06-22: qty 2

## 2017-06-22 MED ORDER — SODIUM CHLORIDE 0.9 % WEIGHT BASED INFUSION
1.0000 mL/kg/h | INTRAVENOUS | Status: DC
  Administered 2017-06-22: 1 mL/kg/h via INTRAVENOUS

## 2017-06-22 MED ORDER — ATORVASTATIN CALCIUM 20 MG PO TABS
80.0000 mg | ORAL_TABLET | Freq: Every day | ORAL | Status: DC
  Administered 2017-06-22: 80 mg via ORAL
  Filled 2017-06-22: qty 4

## 2017-06-22 SURGICAL SUPPLY — 9 items
CATH INFINITI 5FR ANG PIGTAIL (CATHETERS) ×2 IMPLANT
CATH INFINITI 5FR JL4 (CATHETERS) ×2 IMPLANT
CATH INFINITI JR4 5F (CATHETERS) ×2 IMPLANT
DEVICE CLOSURE MYNXGRIP 5F (Vascular Products) ×2 IMPLANT
KIT MANI 3VAL PERCEP (MISCELLANEOUS) ×2 IMPLANT
NEEDLE PERC 18GX7CM (NEEDLE) ×2 IMPLANT
PACK CARDIAC CATH (CUSTOM PROCEDURE TRAY) ×2 IMPLANT
SHEATH AVANTI 5FR X 11CM (SHEATH) ×2 IMPLANT
WIRE GUIDERIGHT .035X150 (WIRE) ×2 IMPLANT

## 2017-06-22 NOTE — Progress Notes (Signed)
*  PRELIMINARY RESULTS* Echocardiogram 2D Echocardiogram has been performed.  Maria Barnes 06/22/2017, 11:48 AM

## 2017-06-22 NOTE — Progress Notes (Signed)
Cardiac cath  Suspected coronary dissection of the mid to distal LAD with severe stenosis/tapering mid LAD to apical region Severe apical and periapical akinesis. Mildly depressed EF, 45 to 50%  Recommendations:  Continue asa, d/c heparin Medical management, b-blockers Would monitor at least overnight  Maria Barnes 06/22/2017, 3:34 PM
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
## 2017-06-22 NOTE — Progress Notes (Signed)
Pt was transported to cardiac cath.
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
## 2017-06-22 NOTE — Plan of Care (Signed)
  Problem: Education: Goal: Knowledge of General Education information will improve Outcome: Progressing   Problem: Clinical Measurements: Goal: Will remain free from infection Outcome: Progressing   Problem: Pain Managment: Goal: General experience of comfort will improve Outcome: Progressing   Problem: Safety: Goal: Ability to remain free from injury will improve Outcome: Progressing   Problem: Education: Goal: Understanding of cardiac disease, CV risk reduction, and recovery process will improve Outcome: Progressing

## 2017-06-22 NOTE — Progress Notes (Addendum)
Pt states that shes hungry. Page prime. Awaiting callback. Will continue to monitor.  Update 1751: Doctor Anselm Jungling ordered to advance diet to heart healthy diet. Will continue to monitor.

## 2017-06-23 DIAGNOSIS — I2542 Coronary artery dissection: Secondary | ICD-10-CM

## 2017-06-23 DIAGNOSIS — I10 Essential (primary) hypertension: Secondary | ICD-10-CM

## 2017-06-23 LAB — CBC
HEMATOCRIT: 39.6 % (ref 35.0–47.0)
HEMOGLOBIN: 13.5 g/dL (ref 12.0–16.0)
MCH: 31.5 pg (ref 26.0–34.0)
MCHC: 34.1 g/dL (ref 32.0–36.0)
MCV: 92.3 fL (ref 80.0–100.0)
PLATELETS: 255 10*3/uL (ref 150–440)
RBC: 4.29 MIL/uL (ref 3.80–5.20)
RDW: 13.5 % (ref 11.5–14.5)
WBC: 7.4 10*3/uL (ref 3.6–11.0)

## 2017-06-23 LAB — HIV ANTIBODY (ROUTINE TESTING W REFLEX): HIV SCREEN 4TH GENERATION: NONREACTIVE

## 2017-06-23 MED ORDER — ASPIRIN 81 MG PO TBEC: 81.0000 mg | DELAYED_RELEASE_TABLET | Freq: Every day | ORAL | 0 refills | Status: AC

## 2017-06-23 MED ORDER — CLOPIDOGREL BISULFATE 75 MG PO TABS: 75.0000 mg | ORAL_TABLET | Freq: Every day | ORAL | 1 refills | Status: DC

## 2017-06-23 MED ORDER — METOPROLOL TARTRATE 100 MG PO TABS: 100.0000 mg | ORAL_TABLET | Freq: Two times a day (BID) | ORAL | 2 refills | Status: DC

## 2017-06-23 MED ORDER — CLOPIDOGREL BISULFATE 75 MG PO TABS
75.0000 mg | ORAL_TABLET | Freq: Every day | ORAL | Status: DC
  Administered 2017-06-23: 75 mg via ORAL
  Filled 2017-06-23: qty 1

## 2017-06-23 MED ORDER — ATORVASTATIN CALCIUM 80 MG PO TABS: 80.0000 mg | ORAL_TABLET | Freq: Every day | ORAL | 0 refills | Status: DC

## 2017-06-23 NOTE — Progress Notes (Signed)
Pt d/c to home today.  IV removed intact.  D/c paperwork printed and reviewed w/pt.  All medication questions and concerns reviewed and pt states understanding.  All Rx's given to patient. Pt requested to walk out for d/c.     

## 2017-06-23 NOTE — Progress Notes (Signed)
[facility] [place] NAME: Maria Barnes    MR#:  [id number]  DATE OF BIRTH:  1955/04/24  SUBJECTIVE:  CHIEF COMPLAINT:   Chief Complaint  Patient presents with  . Chest Pain   No pain now, s/p cath.  REVIEW OF SYSTEMS:  CONSTITUTIONAL: No fever, fatigue or weakness.  EYES: No blurred or double vision.  EARS, NOSE, AND THROAT: No tinnitus or ear pain.  RESPIRATORY: No cough, shortness of breath, wheezing or hemoptysis.  CARDIOVASCULAR: No chest pain, orthopnea, edema.  GASTROINTESTINAL: No nausea, vomiting, diarrhea or abdominal pain.  GENITOURINARY: No dysuria, hematuria.  ENDOCRINE: No polyuria, nocturia,  HEMATOLOGY: No anemia, easy bruising or bleeding SKIN: No rash or lesion. MUSCULOSKELETAL: No joint pain or arthritis.   NEUROLOGIC: No tingling, numbness, weakness.  PSYCHIATRY: No anxiety or depression.   ROS  DRUG ALLERGIES:  No Known Allergies  VITALS:  Blood pressure (!) 144/73, pulse 86, temperature 98.7 F (37.1 C), temperature source Oral, resp. rate 18, height 5\' 3"  (1.6 m), weight 72.1 kg (159 lb), SpO2 95 %.  PHYSICAL EXAMINATION:  GENERAL:  62 y.o.-year-old patient lying in the bed with no acute distress.  EYES: Pupils equal, round, reactive to light and accommodation. No scleral icterus. Extraocular muscles intact.  HEENT: Head atraumatic, normocephalic. Oropharynx and nasopharynx clear.  NECK:  Supple, no jugular venous distention. No thyroid enlargement, no tenderness.  LUNGS: Normal breath sounds bilaterally, no wheezing, rales,rhonchi or crepitation. No use of accessory muscles of respiration.  CARDIOVASCULAR: S1, S2 normal. No murmurs, rubs, or gallops.  ABDOMEN: Soft, nontender, nondistended. Bowel sounds present. No organomegaly or mass.  EXTREMITIES: No pedal edema, cyanosis, or clubbing.  NEUROLOGIC: Cranial nerves II through XII are intact. Muscle strength 5/5 in all extremities. Sensation intact. Gait  not checked.  PSYCHIATRIC: The patient is alert and oriented x 3.  SKIN: No obvious rash, lesion, or ulcer.   Physical Exam LABORATORY PANEL:   CBC Recent Labs  Lab 06/23/17 0505  WBC 7.4  HGB 13.5  HCT 39.6  PLT 255   ------------------------------------------------------------------------------------------------------------------  Chemistries  Recent Labs  Lab 06/22/17 0202  NA 141  K 3.9  CL 109  CO2 26  GLUCOSE 112*  BUN 12  CREATININE 0.71  CALCIUM 8.8*   ------------------------------------------------------------------------------------------------------------------  Cardiac Enzymes Recent Labs  Lab 06/22/17 0010 06/22/17 0550  TROPONINI 1.28* 0.76*   ------------------------------------------------------------------------------------------------------------------  RADIOLOGY:  Dg Chest 2 View  Result Date: 06/21/2017 CLINICAL DATA:  Acute chest pain today. EXAM: CHEST - 2 VIEW COMPARISON:  None. FINDINGS: The cardiomediastinal silhouette is unremarkable. There is no evidence of focal airspace disease, pulmonary edema, suspicious pulmonary nodule/mass, pleural effusion, or pneumothorax. No acute bony abnormalities are identified. IMPRESSION: No active cardiopulmonary disease. Electronically Signed   By: Margarette Canada M.D.   On: 06/21/2017 16:13   Ct Angio Chest Pe W And/or Wo Contrast  Result Date: 06/21/2017 CLINICAL DATA:  62 year old female with acute chest pain and shortness of breath today. Recent diagnosis of breast cancer and lumpectomy. EXAM: CT ANGIOGRAPHY CHEST WITH CONTRAST TECHNIQUE: Multidetector CT imaging of the chest was performed using the standard protocol during bolus administration of intravenous contrast. Multiplanar CT image reconstructions and MIPs were obtained to evaluate the vascular anatomy. CONTRAST:  75 cc insert venous Isovue 370 COMPARISON:  06/21/2017 chest radiograph FINDINGS: Cardiovascular: This is a technically satisfactory  study. No pulmonary emboli are identified. Heart size normal. No thoracic aortic aneurysm or pericardial effusion. Mediastinum/Nodes:  No enlarged mediastinal, hilar, or axillary lymph nodes. Thyroid gland, trachea, and esophagus demonstrate no significant findings. Lungs/Pleura: No airspace disease, consolidation, mass, suspicious nodule, pleural effusion or pneumothorax. Mild bibasilar atelectasis identified. Upper Abdomen: Hepatic steatosis identified.  No acute abnormality. Musculoskeletal: Surgical changes within the LEFT breast identified. No acute or suspicious bony abnormalities identified. Review of the MIP images confirms the above findings. IMPRESSION: 1. No evidence of pulmonary emboli or thoracic aortic aneurysm. 2. Mild bibasilar atelectasis 3. Hepatic steatosis Electronically Signed   By: Margarette Canada M.D.   On: 06/21/2017 17:06    ASSESSMENT AND PLAN:   Active Problems:   NSTEMI (non-ST elevated myocardial infarction) ([place])   Chest pain   Coronary artery dissection   #Non-STEMI /unstable angina Patient is chest pain-free during my examination Initial troponin is at 0.3, cycle troponins went higher. Oxygen 2 L via nasal cannula as needed to keep her sats greater than  94% Give her nitroglycerin as needed for chest pain Started on statin. Kept on heparin drip and cath done. cath done for NSTEMI. Suspectedcoronarydissection of the mid to distal LAD with severe stenosis/taperingmid LAD to apical region Severe apical and periapical akinesis. Mildly depressed EF, 45 to 50%  Recommendations:  Continue asa,plavix, statin, d/c heparin Medical management, b-blockers   #History of breast cancer stage I status post lumpectomy Seen by Dr. Mike Gip today at her office, scheduled to get radiation treatments in future by Dr. Donella Stade. Follow with cancer center.  #Essential hypertension continue metoprolol and titrate as needed  #GERD  Continue Pepcid   #history of migraine  headache patient is currently denying any headache   DVT prophylaxis on heparin drip   All the records are reviewed and case discussed with Care Management/Social Workerr. Management plans discussed with the patient, family and they are in agreement.  CODE STATUS: full.  TOTAL TIME TAKING CARE OF THIS PATIENT: 35 minutes.     POSSIBLE D/C IN 1 DAYS, DEPENDING ON CLINICAL CONDITION.   Vaughan Basta M.D on 06/23/2017   Between 7am to 6pm - Pager - [phone]  After 6pm go to [email or web address] - password [email or web address] [facility]  Office  [phone]  CC: Primary care physician; [place] Pink, MD  Note: This dictation was prepared with Dragon dictation along with smaller phrase technology. Any transcriptional errors that result from this process are unintentional.

## 2017-06-23 NOTE — Plan of Care (Signed)
  Problem: Education: Goal: Knowledge of General Education information will improve Outcome: Progressing   Problem: Health Behavior/Discharge Planning: Goal: Ability to manage health-related needs will improve Outcome: Progressing   Problem: Clinical Measurements: Goal: Will remain free from infection Outcome: Progressing   Problem: Safety: Goal: Ability to remain free from injury will improve Outcome: Progressing   Problem: Education: Goal: Understanding of cardiac disease, CV risk reduction, and recovery process will improve Outcome: Progressing   Problem: Health Behavior/Discharge Planning: Goal: Ability to safely manage health-related needs after discharge will improve Outcome: Progressing

## 2017-06-23 NOTE — Progress Notes (Signed)
Progress Note  Patient Name: Maria Barnes Date of Encounter: 06/23/2017  Primary Cardiologist: Ida Rogue, MD   Subjective   Feeling well.  No chest pain or shortness of breath at rest or with ambulation.   Inpatient Medications    Scheduled Meds: . aspirin EC  81 mg Oral Daily  . atorvastatin  80 mg Oral q1800  . cholecalciferol  400 Units Oral Daily  . famotidine  20 mg Oral Daily  . heparin  4,000 Units Intravenous Once  . metoprolol tartrate  100 mg Oral BID  . multivitamin with minerals  1 tablet Oral Daily  . pantoprazole  40 mg Oral Daily  . pneumococcal 23 valent vaccine  0.5 mL Intramuscular Tomorrow-1000  . vitamin C  500 mg Oral Daily   Continuous Infusions:  PRN Meds: acetaminophen, HYDROcodone-acetaminophen, morphine injection, nitroGLYCERIN, ondansetron (ZOFRAN) IV, polyvinyl alcohol, sodium chloride   Vital Signs    Vitals:   06/22/17 2143 06/22/17 2215 06/23/17 0437 06/23/17 0818  BP: 139/65 (!) 143/59 (!) 152/88 (!) 144/73  Pulse: 86 84 82 86  Resp:    18  Temp: 98.6 F (37 C)  98.1 F (36.7 C) 98.7 F (37.1 C)  TempSrc: Oral  Oral Oral  SpO2: 96%  97% 95%  Weight:      Height:        Intake/Output Summary (Last 24 hours) at 06/23/2017 1059 Last data filed at 06/23/2017 1011 Gross per 24 hour  Intake 600 ml  Output 900 ml  Net -300 ml   Filed Weights   06/21/17 1510 06/21/17 1814  Weight: 162 lb (73.5 kg) 159 lb (72.1 kg)    Telemetry    Sinus rhythm.  No events. - Personally Reviewed  ECG    Sinus rhythm.  Rate 76 bpm.  - Personally Reviewed  Physical Exam   VS:  BP (!) 144/73 (BP Location: Left Arm)   Pulse 86   Temp 98.7 F (37.1 C) (Oral)   Resp 18   Ht 5\' 3"  (1.6 m)   Wt 159 lb (72.1 kg)   SpO2 95%   BMI 28.17 kg/m  , BMI Body mass index is 28.17 kg/m. GENERAL:  Well appearing HEENT: Pupils equal round and reactive, fundi not visualized, oral mucosa unremarkable NECK:  No jugular venous distention, waveform  within normal limits, carotid upstroke brisk and symmetric, no bruits, no thyromegaly LYMPHATICS:  No cervical adenopathy LUNGS:  Clear to auscultation bilaterally HEART:  RRR.  PMI not displaced or sustained,S1 and S2 within normal limits, no S3, no S4, no clicks, no rubs, no murmurs ABD:  Flat, positive bowel sounds normal in frequency in pitch, no bruits, no rebound, no guarding, no midline pulsatile mass, no hepatomegaly, no splenomegaly EXT:  2 plus pulses throughout, no edema, no cyanosis no clubbing.  R groin without ecchymosis, hematoma or bleeding.  SKIN:  No rashes no nodules NEURO:  Cranial nerves II through XII grossly intact, motor grossly intact throughout [facility]:  Cognitively intact, oriented to person place and time   Labs    Chemistry Recent Labs  Lab 06/21/17 1532 06/22/17 0202  NA 138 141  K 3.7 3.9  CL 104 109  CO2 26 26  GLUCOSE 153* 112*  BUN 16 12  CREATININE 0.85 0.71  CALCIUM 9.2 8.8*  GFRNONAA >60 >60  GFRAA >60 >60  ANIONGAP 8 6     Hematology Recent Labs  Lab 06/21/17 1532 06/22/17 0202 06/23/17 0505  WBC 6.6  7.2 7.4  RBC 4.26 4.20 4.29  HGB 13.6 13.4 13.5  HCT 39.5 38.6 39.6  MCV 92.7 92.0 92.3  MCH 31.9 32.0 31.5  MCHC 34.5 34.8 34.1  RDW 13.3 13.2 13.5  PLT 243 269 255    Cardiac Enzymes Recent Labs  Lab 06/21/17 1532 06/21/17 1913 06/22/17 0010 06/22/17 0550  TROPONINI 0.30* 1.49* 1.28* 0.76*   No results for input(s): TROPIPOC in the last 168 hours.   BNPNo results for input(s): BNP, PROBNP in the last 168 hours.   DDimer No results for input(s): DDIMER in the last 168 hours.   Radiology    Dg Chest 2 View  Result Date: 06/21/2017 CLINICAL DATA:  Acute chest pain today. EXAM: CHEST - 2 VIEW COMPARISON:  None. FINDINGS: The cardiomediastinal silhouette is unremarkable. There is no evidence of focal airspace disease, pulmonary edema, suspicious pulmonary nodule/mass, pleural effusion, or pneumothorax. No acute bony  abnormalities are identified. IMPRESSION: No active cardiopulmonary disease. Electronically Signed   By: Margarette Canada M.D.   On: 06/21/2017 16:13   Ct Angio Chest Pe W And/or Wo Contrast  Result Date: 06/21/2017 CLINICAL DATA:  62 year old female with acute chest pain and shortness of breath today. Recent diagnosis of breast cancer and lumpectomy. EXAM: CT ANGIOGRAPHY CHEST WITH CONTRAST TECHNIQUE: Multidetector CT imaging of the chest was performed using the standard protocol during bolus administration of intravenous contrast. Multiplanar CT image reconstructions and MIPs were obtained to evaluate the vascular anatomy. CONTRAST:  75 cc insert venous Isovue 370 COMPARISON:  06/21/2017 chest radiograph FINDINGS: Cardiovascular: This is a technically satisfactory study. No pulmonary emboli are identified. Heart size normal. No thoracic aortic aneurysm or pericardial effusion. Mediastinum/Nodes: No enlarged mediastinal, hilar, or axillary lymph nodes. Thyroid gland, trachea, and esophagus demonstrate no significant findings. Lungs/Pleura: No airspace disease, consolidation, mass, suspicious nodule, pleural effusion or pneumothorax. Mild bibasilar atelectasis identified. Upper Abdomen: Hepatic steatosis identified.  No acute abnormality. Musculoskeletal: Surgical changes within the LEFT breast identified. No acute or suspicious bony abnormalities identified. Review of the MIP images confirms the above findings. IMPRESSION: 1. No evidence of pulmonary emboli or thoracic aortic aneurysm. 2. Mild bibasilar atelectasis 3. Hepatic steatosis Electronically Signed   By: Margarette Canada M.D.   On: 06/21/2017 17:06    Cardiac Studies   LHC 06/22/17:   Mid LAD to Dist LAD lesion is 80% stenosed.  The left ventricular ejection fraction is 45-50% by visual estimate.  LV end diastolic pressure is normal.  There is mild left ventricular systolic dysfunction.  There is no mitral valve regurgitation.  Echo  06/22/17: Study Conclusions  - Left ventricle: The cavity size was normal. Systolic function was   normal. The estimated ejection fraction was in the range of 50%   to 55%. Select images with at least moderate hypokinesis in the   apical, periapical and apical septal region Doppler parameters   are consistent with abnormal left ventricular relaxation (grade 1   diastolic dysfunction). - Left atrium: The atrium was normal in size. - Right ventricle: Systolic function was normal. - Pulmonary arteries: Systolic pressure was within the normal   range.  Patient Profile     62 y.o. female with hypertension and breast cancer s/p recent lumpectomy here with NSTEMI and found to have SCAD of the LAD.   Assessment & Plan    # NSTEMI:  LHC 5/24 revealed dissection of the mid LAD with 80% stenosis.  Echo with mildly reduced systolic function (LVEF 50 to  55% with moderate apical and apical septal hypokinesis).  She will be on aspirin and clopidogrel for 1 year followed by aspirin only. Continue metoprolol and atorvastatin was started this admission.  Plan for repeat lipids/CMP in 6-8 weeks.  She will need cardiac rehab as an outpatient.   # Hypertension: BP well controlled.  Continue metoprolol.  Dose was increased this admission.   # L breast cancer: S/p recent lumpectomy.  Plans for XRT 06/2017.  Stable for discharge from a cardiac standpoint.  We will arrange outpatient follow up.    For questions or updates, please contact [place] Please consult [email or web address] for contact info under Cardiology/STEMI.      Signed, Skeet Latch, MD  06/23/2017, 10:59 AM

## 2017-06-23 NOTE — Progress Notes (Signed)
Pt tolerated ambulation well. No complaints of SOB, dizziness or lightheadedness. Pt oxygen stays at 96-97. Will continue to monitor.

## 2017-06-23 NOTE — Plan of Care (Signed)
  Problem: Pain Managment: Goal: General experience of comfort will improve Outcome: Progressing   
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
## 2017-06-26 ENCOUNTER — Encounter: Payer: Self-pay | Admitting: Cardiovascular Disease

## 2017-06-26 ENCOUNTER — Telehealth: Payer: Self-pay | Admitting: Cardiovascular Disease

## 2017-06-26 ENCOUNTER — Telehealth: Payer: Self-pay

## 2017-06-26 NOTE — Telephone Encounter (Signed)
Routing to support pool to reach out and schedule f/u as suggested on 5/31.  Thanks yall!

## 2017-06-26 NOTE — Telephone Encounter (Signed)
Maria Stabs,  Do you mind adding this patient on to 5/31 to your afternoon?

## 2017-06-26 NOTE — Telephone Encounter (Signed)
Flagged on EMMI report for not having a follow up scheduled and having questions about discharge papers.  Called and spoke with patient.  She reports she's working with Dr. Donivan Scull office regarding scheduling appointment (holiday prevented her from calling on Monday).  As for questions about her discharge papers, she mentioned she didn't have any at this time, stating things were pretty self explanatory.  I thanked her for her time and informed her that she would receive one more automated call in the next few days as a final check-in.

## 2017-06-26 NOTE — Telephone Encounter (Signed)
Patient had a cath 5/24 needs 1 wk fu   Next available too far out please advise.

## 2017-06-26 NOTE — Telephone Encounter (Signed)
As you wish.

## 2017-06-26 NOTE — Telephone Encounter (Signed)
Duplicate message. Routed other message to Ignacia Bayley, NP for possible appointment.

## 2017-06-26 NOTE — Telephone Encounter (Signed)
Patient calling to schedule cath fu within the week   No appt. Available   Please advise

## 2017-06-27 NOTE — Telephone Encounter (Signed)
Pt scheduled for 06/29/17

## 2017-06-28 ENCOUNTER — Encounter: Payer: Self-pay | Admitting: *Deleted

## 2017-06-28 NOTE — Progress Notes (Signed)
  Oncology Nurse Navigator Documentation  Navigator Location: [facility] (06/28/17 1200)   )Navigator Encounter Type: Telephone (06/28/17 1200)                                                    Time Spent with Patient: 15 (06/28/17 1200)   Called to follow up on patient after her visit to the ED last week.  States she had a "heart attack" and a cardiac cath.  Wants to know when to follow up with Dr. Mike Gip.  States she has follow-up with her cardiologist tomorrow.  Talked to Rodena Piety, Dr. Kem Parkinson nurse to ask about patient's follow up and let me know so I can inform the patient.  She is agreeable.

## 2017-06-29 ENCOUNTER — Ambulatory Visit (INDEPENDENT_AMBULATORY_CARE_PROVIDER_SITE_OTHER): Payer: BLUE CROSS/BLUE SHIELD | Admitting: Nurse Practitioner

## 2017-06-29 ENCOUNTER — Encounter: Payer: Self-pay | Admitting: Nurse Practitioner

## 2017-06-29 VITALS — BP 120/80 | HR 80 | Ht 63.0 in | Wt 158.5 lb

## 2017-06-29 DIAGNOSIS — I214 Non-ST elevation (NSTEMI) myocardial infarction: Secondary | ICD-10-CM

## 2017-06-29 DIAGNOSIS — E782 Mixed hyperlipidemia: Secondary | ICD-10-CM

## 2017-06-29 DIAGNOSIS — I2542 Coronary artery dissection: Secondary | ICD-10-CM | POA: Diagnosis not present

## 2017-06-29 DIAGNOSIS — I10 Essential (primary) hypertension: Secondary | ICD-10-CM

## 2017-06-29 MED ORDER — NITROGLYCERIN 0.4 MG SL SUBL: 0.4000 mg | SUBLINGUAL_TABLET | SUBLINGUAL | 3 refills | Status: AC | PRN

## 2017-06-29 MED ORDER — METOPROLOL TARTRATE 100 MG PO TABS: 100.0000 mg | ORAL_TABLET | Freq: Two times a day (BID) | ORAL | 2 refills | Status: DC
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
## 2017-06-29 NOTE — Patient Instructions (Signed)
Medication Instructions:  Your physician has recommended you make the following change in your medication:  1- STOP Plavix. 2- Nitroglycerin 0.4 mg - Dissolve 1 tablet under your tongue every 5 minutes as needed for chest pain. Maximum of 3 doses. If chest pain is not resolved, then call 911 or go to the emergency room.   Labwork: Your physician recommends that you return for lab work in: [place].  - To check your cholesterol and liver function (LIPID, LFT). - You will need to be fasting. - Please go to the [facility]. You will check in at the front desk to the right as you walk into the atrium. Valet Parking is offered if needed. - WEEK OF July 21ST OR [place].     Testing/Procedures: none  Follow-Up: Your physician recommends that you schedule a follow-up appointment in: [place].   If you need a refill on your cardiac medications before your next appointment, please call your pharmacy.

## 2017-07-03 ENCOUNTER — Encounter: Payer: Self-pay | Admitting: Hematology and Oncology

## 2017-07-03 ENCOUNTER — Inpatient Hospital Stay: Payer: BLUE CROSS/BLUE SHIELD | Attending: Hematology and Oncology | Admitting: Hematology and Oncology

## 2017-07-03 VITALS — BP 124/83 | HR 85 | Temp 97.0°F | Resp 18 | Wt 157.3 lb

## 2017-07-03 DIAGNOSIS — Z803 Family history of malignant neoplasm of breast: Secondary | ICD-10-CM | POA: Diagnosis not present

## 2017-07-03 DIAGNOSIS — Z170 Estrogen receptor positive status [ER+]: Secondary | ICD-10-CM | POA: Insufficient documentation

## 2017-07-03 DIAGNOSIS — Z90710 Acquired absence of both cervix and uterus: Secondary | ICD-10-CM | POA: Insufficient documentation

## 2017-07-03 DIAGNOSIS — C50412 Malignant neoplasm of upper-outer quadrant of left female breast: Secondary | ICD-10-CM | POA: Diagnosis not present

## 2017-07-03 DIAGNOSIS — I252 Old myocardial infarction: Secondary | ICD-10-CM | POA: Diagnosis not present

## 2017-07-03 NOTE — Progress Notes (Signed)
Patient offers no complaints today.  Patient here today to go over plan of treatment.
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
## 2017-07-05 ENCOUNTER — Ambulatory Visit: Payer: BLUE CROSS/BLUE SHIELD

## 2017-07-12 ENCOUNTER — Encounter: Payer: Self-pay | Admitting: General Surgery

## 2017-07-12 ENCOUNTER — Encounter: Payer: Self-pay | Admitting: *Deleted

## 2017-07-12 ENCOUNTER — Encounter: Payer: BLUE CROSS/BLUE SHIELD | Attending: Cardiovascular Disease | Admitting: *Deleted

## 2017-07-12 ENCOUNTER — Ambulatory Visit (INDEPENDENT_AMBULATORY_CARE_PROVIDER_SITE_OTHER): Payer: BLUE CROSS/BLUE SHIELD | Admitting: General Surgery

## 2017-07-12 VITALS — BP 116/68 | HR 68 | Resp 12 | Ht 63.0 in | Wt 157.0 lb

## 2017-07-12 VITALS — Ht 63.0 in | Wt 157.7 lb

## 2017-07-12 DIAGNOSIS — Z79899 Other long term (current) drug therapy: Secondary | ICD-10-CM | POA: Diagnosis not present

## 2017-07-12 DIAGNOSIS — I10 Essential (primary) hypertension: Secondary | ICD-10-CM | POA: Diagnosis not present

## 2017-07-12 DIAGNOSIS — C50412 Malignant neoplasm of upper-outer quadrant of left female breast: Secondary | ICD-10-CM | POA: Diagnosis not present

## 2017-07-12 DIAGNOSIS — Z7982 Long term (current) use of aspirin: Secondary | ICD-10-CM | POA: Insufficient documentation

## 2017-07-12 DIAGNOSIS — Z170 Estrogen receptor positive status [ER+]: Secondary | ICD-10-CM | POA: Diagnosis not present

## 2017-07-12 DIAGNOSIS — I214 Non-ST elevation (NSTEMI) myocardial infarction: Secondary | ICD-10-CM | POA: Diagnosis not present

## 2017-07-12 DIAGNOSIS — C50919 Malignant neoplasm of unspecified site of unspecified female breast: Secondary | ICD-10-CM | POA: Diagnosis not present

## 2017-07-12 DIAGNOSIS — M8580 Other specified disorders of bone density and structure, unspecified site: Secondary | ICD-10-CM | POA: Diagnosis not present

## 2017-07-12 DIAGNOSIS — K219 Gastro-esophageal reflux disease without esophagitis: Secondary | ICD-10-CM | POA: Insufficient documentation

## 2017-07-12 NOTE — Progress Notes (Signed)
Daily Session Note  Patient Details  Name: Maria Barnes MRN: [id number] Date of Birth: Aug 26, 1955 Referring Provider:     Cardiac Rehab from 07/12/2017 in [facility]  Referring Provider  [place]      Encounter Date: 07/12/2017  Check In: Session Check In - 07/12/17 1158      Check-In   Location  [facility]-Cardiac & Pulmonary Rehab    Staff Present  Heath Lark, RN, BSN, CCRP;Meredith Sherryll Burger, RN Vickki Hearing, BA, ACSM CEP, Exercise Physiologist    Supervising physician immediately available to respond to emergencies  See telemetry face sheet for immediately available ER MD    Warm-up and Cool-down  Not performed (comment)    Resistance Training Performed  Yes    VAD Patient?  No      Pain Assessment   Currently in Pain?  No/denies        Exercise Prescription Changes - 07/12/17 1500      Response to Exercise   Blood Pressure (Admit)  106/68    Blood Pressure (Exercise)  120/68    Blood Pressure (Exit)  112/72    Heart Rate (Admit)  73 bpm    Heart Rate (Exercise)  97 bpm    Heart Rate (Exit)  72 bpm    Oxygen Saturation (Admit)  93 %    Oxygen Saturation (Exercise)  98 %    Oxygen Saturation (Exit)  97 %    Rating of Perceived Exertion (Exercise)  13       Social History   Tobacco Use  Smoking Status Never Smoker  Smokeless Tobacco Never Used    Goals Met:  Exercise tolerated well Personal goals reviewed  Goals Unmet:  Not Applicable  Comments: medical review today   Dr. Emily Filbert is Medical Director for [facility].
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
## 2017-07-12 NOTE — Patient Instructions (Signed)
Patient Instructions  Patient Details  Name: Maria Barnes MRN: [id number] Date of Birth: Oct 11, 1955 Referring Provider:  Minna Merritts, MD  Below are your personal goals for exercise, nutrition, and risk factors. Our goal is to help you stay on track towards obtaining and maintaining these goals. We will be discussing your progress on these goals with you throughout the program.  Initial Exercise Prescription: Initial Exercise Prescription - 07/12/17 1500      Date of Initial Exercise RX and Referring Provider   Date  07/12/17    Referring Provider  Gollan      Treadmill   MPH  2.6    Grade  1    Minutes  15    METs  3.3      Recumbant Bike   Level  3    RPM  60    Watts  30    Minutes  15    METs  3.3      NuStep   Level  3    SPM  80    Minutes  15    METs  3.3      Prescription Details   Frequency (times per week)  3    Duration  Progress to 45 minutes of aerobic exercise without signs/symptoms of physical distress      Intensity   THRR 40-80% of Max Heartrate  101-139    Ratings of Perceived Exertion  11-15    Perceived Dyspnea  0-4      Resistance Training   Training Prescription  Yes    Weight  3 lb    Reps  10-15       Exercise Goals: Frequency: Be able to perform aerobic exercise two to three times per week in program working toward 2-5 days per week of home exercise.  Intensity: Work with a perceived exertion of 11 (fairly light) - 15 (hard) while following your exercise prescription.  We will make changes to your prescription with you as you progress through the program.   Duration: Be able to do 30 to 45 minutes of continuous aerobic exercise in addition to a 5 minute warm-up and a 5 minute cool-down routine.   Nutrition Goals: Your personal nutrition goals will be established when you do your nutrition analysis with the dietician.  The following are general nutrition guidelines to follow: Cholesterol < 200mg /day Sodium < 1500mg /day Fiber:  Women over 50 yrs - 21 grams per day  Personal Goals: Personal Goals and Risk Factors at Admission - 07/12/17 1450      Core Components/Risk Factors/Patient Goals on Admission    Weight Management  Yes;Obesity;Weight Loss    Intervention  Weight Management: Develop a combined nutrition and exercise program designed to reach desired caloric intake, while maintaining appropriate intake of nutrient and fiber, sodium and fats, and appropriate energy expenditure required for the weight goal.    Admit Weight  157 lb 11.2 oz (71.5 kg)    Goal Weight: Short Term  155 lb (70.3 kg)    Goal Weight: Long Term  140 lb (63.5 kg)    Expected Outcomes  Short Term: Continue to assess and modify interventions until short term weight is achieved;Long Term: Adherence to nutrition and physical activity/exercise program aimed toward attainment of established weight goal;Weight Loss: Understanding of general recommendations for a balanced deficit meal plan, which promotes 1-2 lb weight loss per week and includes a negative energy balance of [phone] kcal/d    Hypertension  Yes  Intervention  Provide education on lifestyle modifcations including regular physical activity/exercise, weight management, moderate sodium restriction and increased consumption of fresh fruit, vegetables, and low fat dairy, alcohol moderation, and smoking cessation.;Monitor prescription use compliance.    Expected Outcomes  Short Term: Continued assessment and intervention until BP is < 140/75mm HG in hypertensive participants. < 130/25mm HG in hypertensive participants with diabetes, heart failure or chronic kidney disease.;Long Term: Maintenance of blood pressure at goal levels.    Lipids  Yes    Intervention  Provide education and support for participant on nutrition & aerobic/resistive exercise along with prescribed medications to achieve LDL 70mg , HDL >40mg .    Expected Outcomes  Short Term: Participant states understanding of desired  cholesterol values and is compliant with medications prescribed. Participant is following exercise prescription and nutrition guidelines.;Long Term: Cholesterol controlled with medications as prescribed, with individualized exercise RX and with personalized nutrition plan. Value goals: LDL < 70mg , HDL > 40 mg.    Stress  Yes    Intervention  Offer individual and/or small group education and counseling on adjustment to heart disease, stress management and health-related lifestyle change. Teach and support self-help strategies.;Refer participants experiencing significant psychosocial distress to appropriate mental health specialists for further evaluation and treatment. When possible, include family members and significant others in education/counseling sessions.    Expected Outcomes  Short Term: Participant demonstrates changes in health-related behavior, relaxation and other stress management skills, ability to obtain effective social support, and compliance with psychotropic medications if prescribed.;Long Term: Emotional wellbeing is indicated by absence of clinically significant psychosocial distress or social isolation.       Tobacco Use Initial Evaluation: Social History   Tobacco Use  Smoking Status Never Smoker  Smokeless Tobacco Never Used    Exercise Goals and Review: Exercise Goals    Row Name 07/12/17 1519             Exercise Goals   Increase Physical Activity  Yes       Intervention  Provide advice, education, support and counseling about physical activity/exercise needs.;Develop an individualized exercise prescription for aerobic and resistive training based on initial evaluation findings, risk stratification, comorbidities and participant's personal goals.       Expected Outcomes  Short Term: Attend rehab on a regular basis to increase amount of physical activity.;Long Term: Add in home exercise to make exercise part of routine and to increase amount of physical  activity.;Long Term: Exercising regularly at least 3-5 days a week.       Increase Strength and Stamina  Yes       Intervention  Provide advice, education, support and counseling about physical activity/exercise needs.;Develop an individualized exercise prescription for aerobic and resistive training based on initial evaluation findings, risk stratification, comorbidities and participant's personal goals.       Expected Outcomes  Short Term: Increase workloads from initial exercise prescription for resistance, speed, and METs.;Short Term: Perform resistance training exercises routinely during rehab and add in resistance training at home;Long Term: Improve cardiorespiratory fitness, muscular endurance and strength as measured by increased METs and functional capacity (6MWT)       Able to understand and use rate of perceived exertion (RPE) scale  Yes       Intervention  Provide education and explanation on how to use RPE scale       Expected Outcomes  Short Term: Able to use RPE daily in rehab to express subjective intensity level;Long Term:  Able to use RPE to guide  intensity level when exercising independently       Able to understand and use Dyspnea scale  Yes       Intervention  Provide education and explanation on how to use Dyspnea scale       Expected Outcomes  Short Term: Able to use Dyspnea scale daily in rehab to express subjective sense of shortness of breath during exertion;Long Term: Able to use Dyspnea scale to guide intensity level when exercising independently       Knowledge and understanding of Target Heart Rate Range (THRR)  Yes       Intervention  Provide education and explanation of THRR including how the numbers were predicted and where they are located for reference       Expected Outcomes  Short Term: Able to state/look up THRR;Short Term: Able to use daily as guideline for intensity in rehab;Long Term: Able to use THRR to govern intensity when exercising independently       Able  to check pulse independently  Yes       Intervention  Provide education and demonstration on how to check pulse in carotid and radial arteries.;Review the importance of being able to check your own pulse for safety during independent exercise       Expected Outcomes  Short Term: Able to explain why pulse checking is important during independent exercise;Long Term: Able to check pulse independently and accurately       Understanding of Exercise Prescription  Yes       Intervention  Provide education, explanation, and written materials on patient's individual exercise prescription       Expected Outcomes  Short Term: Able to explain program exercise prescription;Long Term: Able to explain home exercise prescription to exercise independently          Copy of goals given to participant.

## 2017-07-12 NOTE — Patient Instructions (Addendum)
Patient to return in two months. The patient is aware to call back for any questions or concerns. 
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
## 2017-07-13 ENCOUNTER — Encounter: Payer: Self-pay | Admitting: Urgent Care

## 2017-07-13 ENCOUNTER — Encounter: Payer: Self-pay | Admitting: Hematology and Oncology

## 2017-07-17 ENCOUNTER — Encounter: Payer: Self-pay | Admitting: Hematology and Oncology

## 2017-07-18 ENCOUNTER — Encounter: Payer: Self-pay | Admitting: *Deleted

## 2017-07-18 DIAGNOSIS — C50412 Malignant neoplasm of upper-outer quadrant of left female breast: Secondary | ICD-10-CM | POA: Diagnosis not present

## 2017-07-18 DIAGNOSIS — I10 Essential (primary) hypertension: Secondary | ICD-10-CM | POA: Diagnosis not present

## 2017-07-18 DIAGNOSIS — K219 Gastro-esophageal reflux disease without esophagitis: Secondary | ICD-10-CM | POA: Diagnosis not present

## 2017-07-18 DIAGNOSIS — Z170 Estrogen receptor positive status [ER+]: Secondary | ICD-10-CM | POA: Diagnosis not present

## 2017-07-18 DIAGNOSIS — I214 Non-ST elevation (NSTEMI) myocardial infarction: Secondary | ICD-10-CM

## 2017-07-18 DIAGNOSIS — M8580 Other specified disorders of bone density and structure, unspecified site: Secondary | ICD-10-CM | POA: Diagnosis not present

## 2017-07-18 DIAGNOSIS — Z79899 Other long term (current) drug therapy: Secondary | ICD-10-CM | POA: Diagnosis not present

## 2017-07-18 DIAGNOSIS — Z7982 Long term (current) use of aspirin: Secondary | ICD-10-CM | POA: Diagnosis not present

## 2017-07-18 NOTE — Progress Notes (Signed)
Daily Session Note  Patient Details  Name: LILLYANN AHART MRN: [id number] Date of Birth: 08/28/1955 Referring Provider:     Cardiac Rehab from 07/12/2017 in [facility]  Referring Provider  Gollan      Encounter Date: 07/18/2017  Check In: Session Check In - 07/18/17 0747      Check-In   Location  [facility]    Staff Present  Justin Mend RCP,RRT,BSRT;Heath Lark, RN, BSN, CCRP;Jessica Luan Pulling, MA, RCEP, CCRP, Exercise Physiologist    Supervising physician immediately available to respond to emergencies  See telemetry face sheet for immediately available ER MD    Medication changes reported      No    Fall or balance concerns reported     No    Tobacco Cessation  No Change    Warm-up and Cool-down  Performed on first and last piece of equipment    Resistance Training Performed  Yes    VAD Patient?  No      Pain Assessment   Currently in Pain?  No/denies          Social History   Tobacco Use  Smoking Status Never Smoker  Smokeless Tobacco Never Used    Goals Met:  Exercise tolerated well Personal goals reviewed No report of cardiac concerns or symptoms Strength training completed today  Goals Unmet:  Not Applicable  Comments: First full day of exercise!  Patient was oriented to gym and equipment including functions, settings, policies, and procedures.  Patient's individual exercise prescription and treatment plan were reviewed.  All starting workloads were established based on the results of the 6 minute walk test done at initial orientation visit.  The plan for exercise progression was also introduced and progression will be customized based on patient's performance and goals.   Dr. Emily Filbert is Medical Director for [facility].

## 2017-07-18 NOTE — Progress Notes (Signed)
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 Director review.  HAs attended orientation only         Comments:

## 2017-07-19 ENCOUNTER — Ambulatory Visit
Admission: RE | Admit: 2017-07-19 | Discharge: 2017-07-19 | Disposition: A | Discharge status: Home / Self Care | Payer: BLUE CROSS/BLUE SHIELD | Source: Ambulatory Visit | Attending: Radiation Oncology | Admitting: Radiation Oncology

## 2017-07-19 DIAGNOSIS — C50412 Malignant neoplasm of upper-outer quadrant of left female breast: Secondary | ICD-10-CM | POA: Diagnosis not present

## 2017-07-19 DIAGNOSIS — Z170 Estrogen receptor positive status [ER+]: Secondary | ICD-10-CM | POA: Insufficient documentation

## 2017-07-19 DIAGNOSIS — Z510 Encounter for antineoplastic radiation therapy: Secondary | ICD-10-CM | POA: Diagnosis not present

## 2017-07-20 ENCOUNTER — Encounter: Payer: Self-pay | Admitting: Urgent Care

## 2017-07-20 ENCOUNTER — Other Ambulatory Visit: Payer: Self-pay | Admitting: *Deleted

## 2017-07-20 DIAGNOSIS — M8580 Other specified disorders of bone density and structure, unspecified site: Secondary | ICD-10-CM | POA: Diagnosis not present

## 2017-07-20 DIAGNOSIS — Z170 Estrogen receptor positive status [ER+]: Secondary | ICD-10-CM | POA: Diagnosis not present

## 2017-07-20 DIAGNOSIS — I214 Non-ST elevation (NSTEMI) myocardial infarction: Secondary | ICD-10-CM

## 2017-07-20 DIAGNOSIS — K219 Gastro-esophageal reflux disease without esophagitis: Secondary | ICD-10-CM | POA: Diagnosis not present

## 2017-07-20 DIAGNOSIS — C50412 Malignant neoplasm of upper-outer quadrant of left female breast: Secondary | ICD-10-CM

## 2017-07-20 DIAGNOSIS — Z79899 Other long term (current) drug therapy: Secondary | ICD-10-CM | POA: Diagnosis not present

## 2017-07-20 DIAGNOSIS — I10 Essential (primary) hypertension: Secondary | ICD-10-CM | POA: Diagnosis not present

## 2017-07-20 DIAGNOSIS — Z7982 Long term (current) use of aspirin: Secondary | ICD-10-CM | POA: Diagnosis not present

## 2017-07-20 NOTE — Progress Notes (Signed)
Daily Session Note  Patient Details  Name: Maria Barnes MRN: [id number] Date of Birth: Oct 30, 1955 Referring Provider:     Cardiac Rehab from 07/12/2017 in [facility] Cardiac and Pulmonary Rehab  Referring Provider  Gollan      Encounter Date: 07/20/2017  Check In: Session Check In - 07/20/17 0827      Check-In   Location  [facility]-Cardiac & Pulmonary Rehab    Staff Present  Renita Papa, RN BSN;Jessica Luan Pulling, MA, RCEP, CCRP, Exercise Physiologist;Amanda Oletta Darter, [place], ACSM CEP, Exercise Physiologist    Supervising physician immediately available to respond to emergencies  See telemetry face sheet for immediately available ER MD    Medication changes reported      No    Fall or balance concerns reported     No    Warm-up and Cool-down  Performed on first and last piece of equipment    Resistance Training Performed  Yes    VAD Patient?  No      Pain Assessment   Currently in Pain?  No/denies    Multiple Pain Sites  No          Social History   Tobacco Use  Smoking Status Never Smoker  Smokeless Tobacco Never Used    Goals Met:  Independence with exercise equipment Exercise tolerated well No report of cardiac concerns or symptoms Strength training completed today  Goals Unmet:  Not Applicable  Comments: Pt able to follow exercise prescription today without complaint.  Will continue to monitor for progression.    Dr. Emily Filbert is Medical Director for [place] and [facility].

## 2017-07-23 ENCOUNTER — Encounter: Payer: BLUE CROSS/BLUE SHIELD | Admitting: *Deleted

## 2017-07-23 DIAGNOSIS — Z170 Estrogen receptor positive status [ER+]: Secondary | ICD-10-CM | POA: Diagnosis not present

## 2017-07-23 DIAGNOSIS — I214 Non-ST elevation (NSTEMI) myocardial infarction: Secondary | ICD-10-CM | POA: Diagnosis not present

## 2017-07-23 DIAGNOSIS — C50412 Malignant neoplasm of upper-outer quadrant of left female breast: Secondary | ICD-10-CM | POA: Diagnosis not present

## 2017-07-23 DIAGNOSIS — Z7982 Long term (current) use of aspirin: Secondary | ICD-10-CM | POA: Diagnosis not present

## 2017-07-23 DIAGNOSIS — I10 Essential (primary) hypertension: Secondary | ICD-10-CM | POA: Diagnosis not present

## 2017-07-23 DIAGNOSIS — M8580 Other specified disorders of bone density and structure, unspecified site: Secondary | ICD-10-CM | POA: Diagnosis not present

## 2017-07-23 DIAGNOSIS — Z79899 Other long term (current) drug therapy: Secondary | ICD-10-CM | POA: Diagnosis not present

## 2017-07-23 DIAGNOSIS — K219 Gastro-esophageal reflux disease without esophagitis: Secondary | ICD-10-CM | POA: Diagnosis not present

## 2017-07-23 NOTE — Progress Notes (Signed)
Daily Session Note  Patient Details  Name: Maria Barnes MRN: [id number] Date of Birth: 1955/04/20 Referring Provider:     Cardiac Rehab from 07/12/2017 in [facility] Cardiac and Pulmonary Rehab  Referring Provider  Gollan      Encounter Date: 07/23/2017  Check In: Session Check In - 07/23/17 0753      Check-In   Location  [facility]Cardiac & Pulmonary Re[facility]    Staff Present  Gerlene Burdock, RN, Moises Blood, BS, ACSM CEP, Exercise Physiologist;Elvira Langston Luan Pulling, [place], RCEP, CCRP, Exercise Physiologist    Supervising physician immediately available to respond to emergencies  See telemetry face sheet for immediately available ER MD    Medication changes reported      No    Fall or balance concerns reported     No    Warm-up and Cool-down  Performed on first and last piece of equipment    Resistance Training Performed  Yes    VAD Patient?  No      Pain Assessment   Currently in Pain?  No/denies          Social History   Tobacco Use  Smoking Status Never Smoker  Smokeless Tobacco Never Used    Goals Met:  Independence with exercise equipment Exercise tolerated well No report of cardiac concerns or symptoms Strength training completed today  Goals Unmet:  Not Applicable  Comments: Pt able to follow exercise prescription today without complaint.  Will continue to monitor for progression.    Dr. Emily Filbert is Medical Director for [place] and [facility].

## 2017-07-24 DIAGNOSIS — Z170 Estrogen receptor positive status [ER+]: Secondary | ICD-10-CM | POA: Diagnosis not present

## 2017-07-24 DIAGNOSIS — C50412 Malignant neoplasm of upper-outer quadrant of left female breast: Secondary | ICD-10-CM | POA: Diagnosis not present

## 2017-07-24 DIAGNOSIS — Z510 Encounter for antineoplastic radiation therapy: Secondary | ICD-10-CM | POA: Diagnosis not present

## 2017-07-25 ENCOUNTER — Encounter: Payer: BLUE CROSS/BLUE SHIELD | Admitting: *Deleted

## 2017-07-25 DIAGNOSIS — K219 Gastro-esophageal reflux disease without esophagitis: Secondary | ICD-10-CM | POA: Diagnosis not present

## 2017-07-25 DIAGNOSIS — Z0000 Encounter for general adult medical examination without abnormal findings: Secondary | ICD-10-CM | POA: Diagnosis not present

## 2017-07-25 DIAGNOSIS — Z79899 Other long term (current) drug therapy: Secondary | ICD-10-CM | POA: Diagnosis not present

## 2017-07-25 DIAGNOSIS — I214 Non-ST elevation (NSTEMI) myocardial infarction: Secondary | ICD-10-CM

## 2017-07-25 DIAGNOSIS — M8580 Other specified disorders of bone density and structure, unspecified site: Secondary | ICD-10-CM | POA: Diagnosis not present

## 2017-07-25 DIAGNOSIS — Z7982 Long term (current) use of aspirin: Secondary | ICD-10-CM | POA: Diagnosis not present

## 2017-07-25 DIAGNOSIS — E785 Hyperlipidemia, unspecified: Secondary | ICD-10-CM | POA: Diagnosis not present

## 2017-07-25 DIAGNOSIS — C50412 Malignant neoplasm of upper-outer quadrant of left female breast: Secondary | ICD-10-CM | POA: Diagnosis not present

## 2017-07-25 DIAGNOSIS — Z23 Encounter for immunization: Secondary | ICD-10-CM | POA: Diagnosis not present

## 2017-07-25 DIAGNOSIS — I10 Essential (primary) hypertension: Secondary | ICD-10-CM | POA: Diagnosis not present

## 2017-07-25 DIAGNOSIS — Z170 Estrogen receptor positive status [ER+]: Secondary | ICD-10-CM | POA: Diagnosis not present

## 2017-07-25 NOTE — Progress Notes (Signed)
Daily Session Note  Patient Details  Name: Maria Barnes MRN: [id number] Date of Birth: 14-Feb-1955 Referring Provider:     Cardiac Rehab from 07/12/2017 in [facility] Cardiac and Pulmonary Rehab  Referring Provider  Gollan      Encounter Date: 07/25/2017  Check In: Session Check In - 07/25/17 0802      Check-In   Location  [facility]Cardiac [facility] Pulmonary Rehab    Staff Present  Heath Lark, RN, BSN, CCRP;Lazaro Isenhower Luan Pulling, MA, RCEP, CCRP, Exercise Physiologist;Joseph Flavia Shipper    Supervising physician immediately available to respond to emergencies  See telemetry face sheet for immediately available ER MD    Medication changes reported      No    Fall or balance concerns reported     No    Warm-up and Cool-down  Performed on first and last piece of equipment    Resistance Training Performed  Yes    VAD Patient?  No    PAD/SET Patient?  No      Pain Assessment   Currently in Pain?  No/denies          Social History   Tobacco Use  Smoking Status Never Smoker  Smokeless Tobacco Never Used    Goals Met:  Independence with exercise equipment Exercise tolerated well No report of cardiac concerns or symptoms Strength training completed today  Goals Unmet:  Not Applicable  Comments: Pt able to follow exercise prescription today without complaint.  Will continue to monitor for progression.    Dr. Emily Filbert is Medical Director for [place] and [facility].

## 2017-07-26 ENCOUNTER — Ambulatory Visit
Admission: RE | Admit: 2017-07-26 | Discharge: 2017-07-26 | Disposition: A | Discharge status: Home / Self Care | Payer: BLUE CROSS/BLUE SHIELD | Source: Ambulatory Visit | Attending: Radiation Oncology | Admitting: Radiation Oncology

## 2017-07-26 DIAGNOSIS — C50412 Malignant neoplasm of upper-outer quadrant of left female breast: Secondary | ICD-10-CM | POA: Diagnosis not present

## 2017-07-26 DIAGNOSIS — Z510 Encounter for antineoplastic radiation therapy: Secondary | ICD-10-CM | POA: Diagnosis not present

## 2017-07-26 DIAGNOSIS — Z170 Estrogen receptor positive status [ER+]: Secondary | ICD-10-CM | POA: Diagnosis not present

## 2017-07-27 DIAGNOSIS — I10 Essential (primary) hypertension: Secondary | ICD-10-CM | POA: Diagnosis not present

## 2017-07-27 DIAGNOSIS — Z79899 Other long term (current) drug therapy: Secondary | ICD-10-CM | POA: Diagnosis not present

## 2017-07-27 DIAGNOSIS — K219 Gastro-esophageal reflux disease without esophagitis: Secondary | ICD-10-CM | POA: Diagnosis not present

## 2017-07-27 DIAGNOSIS — C50412 Malignant neoplasm of upper-outer quadrant of left female breast: Secondary | ICD-10-CM | POA: Diagnosis not present

## 2017-07-27 DIAGNOSIS — Z170 Estrogen receptor positive status [ER+]: Secondary | ICD-10-CM | POA: Diagnosis not present

## 2017-07-27 DIAGNOSIS — I214 Non-ST elevation (NSTEMI) myocardial infarction: Secondary | ICD-10-CM | POA: Diagnosis not present

## 2017-07-27 DIAGNOSIS — M8580 Other specified disorders of bone density and structure, unspecified site: Secondary | ICD-10-CM | POA: Diagnosis not present

## 2017-07-27 DIAGNOSIS — Z7982 Long term (current) use of aspirin: Secondary | ICD-10-CM | POA: Diagnosis not present

## 2017-07-27 NOTE — Progress Notes (Signed)
Daily Session Note  Patient Details  Name: Maria Barnes MRN: [id number] Date of Birth: 29-Jul-1955 Referring Provider:     Cardiac Rehab from 07/12/2017 in [facility] Cardiac and Pulmonary Rehab  Referring Provider  Gollan      Encounter Date: 07/27/2017  Check In: Session Check In - 07/27/17 0827      Check-In   Location  [facility]    Staff Present  Alberteen Sam, MA, RCEP, CCRP, Exercise Physiologist;Meredith Sherryll Burger, RN Vickki Hearing, BA, ACSM CEP, Exercise Physiologist    Supervising physician immediately available to respond to emergencies  See telemetry face sheet for immediately available ER MD    Medication changes reported      No    Fall or balance concerns reported     No    Warm-up and Cool-down  Performed on first and last piece of equipment    Resistance Training Performed  Yes    VAD Patient?  No    PAD/SET Patient?  No      Pain Assessment   Currently in Pain?  No/denies    Multiple Pain Sites  No          Social History   Tobacco Use  Smoking Status Never Smoker  Smokeless Tobacco Never Used    Goals Met:  Independence with exercise equipment Exercise tolerated well No report of cardiac concerns or symptoms Strength training completed today  Goals Unmet:  Not Applicable  Comments: Pt able to follow exercise prescription today without complaint.  Will continue to monitor for progression.    Dr. Emily Filbert is Medical Director for [place] and [facility].

## 2017-07-30 ENCOUNTER — Ambulatory Visit
Admission: RE | Admit: 2017-07-30 | Discharge: 2017-07-30 | Disposition: A | Discharge status: Home / Self Care | Payer: BLUE CROSS/BLUE SHIELD | Source: Ambulatory Visit | Attending: Radiation Oncology | Admitting: Radiation Oncology

## 2017-07-30 ENCOUNTER — Encounter: Payer: BLUE CROSS/BLUE SHIELD | Attending: Cardiovascular Disease | Admitting: *Deleted

## 2017-07-30 DIAGNOSIS — Z170 Estrogen receptor positive status [ER+]: Secondary | ICD-10-CM | POA: Insufficient documentation

## 2017-07-30 DIAGNOSIS — Z510 Encounter for antineoplastic radiation therapy: Secondary | ICD-10-CM | POA: Insufficient documentation

## 2017-07-30 DIAGNOSIS — Z7982 Long term (current) use of aspirin: Secondary | ICD-10-CM | POA: Diagnosis not present

## 2017-07-30 DIAGNOSIS — M8580 Other specified disorders of bone density and structure, unspecified site: Secondary | ICD-10-CM | POA: Insufficient documentation

## 2017-07-30 DIAGNOSIS — I10 Essential (primary) hypertension: Secondary | ICD-10-CM | POA: Insufficient documentation

## 2017-07-30 DIAGNOSIS — K219 Gastro-esophageal reflux disease without esophagitis: Secondary | ICD-10-CM | POA: Insufficient documentation

## 2017-07-30 DIAGNOSIS — Z79899 Other long term (current) drug therapy: Secondary | ICD-10-CM | POA: Diagnosis not present

## 2017-07-30 DIAGNOSIS — C50412 Malignant neoplasm of upper-outer quadrant of left female breast: Secondary | ICD-10-CM | POA: Insufficient documentation

## 2017-07-30 DIAGNOSIS — I214 Non-ST elevation (NSTEMI) myocardial infarction: Secondary | ICD-10-CM | POA: Insufficient documentation

## 2017-07-30 NOTE — Progress Notes (Signed)
Daily Session Note  Patient Details  Name: BURNETT LIEBER MRN: [id number] Date of Birth: [birth year] Referring Provider:     Cardiac Rehab from 07/12/2017 in [facility] Cardiac and Pulmonary Rehab  Referring Provider  Gollan      Encounter Date: 07/30/2017  Check In: Session Check In - 07/30/17 0757      Check-In   Location  [facility]    Staff Present  Joellyn Rued, BS, PEC;Susanne Bice, RN, BSN, CCRP;Halley Kincer [place], [place], RCEP, CCRP, Exercise Physiologist    Supervising physician immediately available to respond to emergencies  See telemetry face sheet for immediately available ER MD    Medication changes reported      No    Fall or balance concerns reported     No    Warm-up and Cool-down  Performed on first and last piece of equipment    Resistance Training Performed  Yes    VAD Patient?  No    PAD/SET Patient?  No      Pain Assessment   Currently in Pain?  No/denies          Social History   Tobacco Use  Smoking Status Never Smoker  Smokeless Tobacco Never Used    Goals Met:  Independence with exercise equipment Exercise tolerated well No report of cardiac concerns or symptoms Strength training completed today  Goals Unmet:  Not Applicable  Comments: Pt able to follow exercise prescription today without complaint.  Will continue to monitor for progression.    Dr. Emily Filbert is Medical Director for [place] and [facility].

## 2017-07-31 ENCOUNTER — Ambulatory Visit
Admission: RE | Admit: 2017-07-31 | Discharge: 2017-07-31 | Disposition: A | Discharge status: Home / Self Care | Payer: BLUE CROSS/BLUE SHIELD | Source: Ambulatory Visit | Attending: Radiation Oncology | Admitting: Radiation Oncology

## 2017-07-31 DIAGNOSIS — Z170 Estrogen receptor positive status [ER+]: Secondary | ICD-10-CM | POA: Diagnosis not present

## 2017-07-31 DIAGNOSIS — Z510 Encounter for antineoplastic radiation therapy: Secondary | ICD-10-CM | POA: Diagnosis not present

## 2017-07-31 DIAGNOSIS — C50412 Malignant neoplasm of upper-outer quadrant of left female breast: Secondary | ICD-10-CM | POA: Diagnosis not present

## 2017-08-01 ENCOUNTER — Ambulatory Visit
Admission: RE | Admit: 2017-08-01 | Discharge: 2017-08-01 | Disposition: A | Discharge status: Home / Self Care | Payer: BLUE CROSS/BLUE SHIELD | Source: Ambulatory Visit | Attending: Radiation Oncology | Admitting: Radiation Oncology

## 2017-08-01 DIAGNOSIS — Z170 Estrogen receptor positive status [ER+]: Secondary | ICD-10-CM | POA: Diagnosis not present

## 2017-08-01 DIAGNOSIS — I10 Essential (primary) hypertension: Secondary | ICD-10-CM | POA: Diagnosis not present

## 2017-08-01 DIAGNOSIS — K219 Gastro-esophageal reflux disease without esophagitis: Secondary | ICD-10-CM | POA: Diagnosis not present

## 2017-08-01 DIAGNOSIS — Z510 Encounter for antineoplastic radiation therapy: Secondary | ICD-10-CM | POA: Diagnosis not present

## 2017-08-01 DIAGNOSIS — C50412 Malignant neoplasm of upper-outer quadrant of left female breast: Secondary | ICD-10-CM | POA: Diagnosis not present

## 2017-08-01 DIAGNOSIS — I214 Non-ST elevation (NSTEMI) myocardial infarction: Secondary | ICD-10-CM

## 2017-08-01 DIAGNOSIS — M8580 Other specified disorders of bone density and structure, unspecified site: Secondary | ICD-10-CM | POA: Diagnosis not present

## 2017-08-01 DIAGNOSIS — Z7982 Long term (current) use of aspirin: Secondary | ICD-10-CM | POA: Diagnosis not present

## 2017-08-01 DIAGNOSIS — Z79899 Other long term (current) drug therapy: Secondary | ICD-10-CM | POA: Diagnosis not present

## 2017-08-01 NOTE — Progress Notes (Signed)
Daily Session Note  Patient Details  Name: Maria Barnes MRN: [id number] Date of Birth: [birth year] Referring Provider:     Cardiac Rehab from 07/12/2017 in [facility] Cardiac and Pulmonary Rehab  Referring Provider  Gollan      Encounter Date: 08/01/2017  Check In: Session Check In - 08/01/17 0742      Check-In   Location  [facility]    Staff Present  Justin Mend RCP,RRT,BSRT;Heath Lark, RN, BSN, CCRP;Jessica Luan Pulling, MA, RCEP, CCRP, Exercise Physiologist    Supervising physician immediately available to respond to emergencies  See telemetry face sheet for immediately available ER MD    Medication changes reported      No    Fall or balance concerns reported     No    Warm-up and Cool-down  Performed on first and last piece of equipment    Resistance Training Performed  Yes    VAD Patient?  No      Pain Assessment   Currently in Pain?  No/denies          Social History   Tobacco Use  Smoking Status Never Smoker  Smokeless Tobacco Never Used    Goals Met:  Independence with exercise equipment Exercise tolerated well Personal goals reviewed No report of cardiac concerns or symptoms Strength training completed today  Goals Unmet:  Not Applicable  Comments: Pt able to follow exercise prescription today without complaint.  Will continue to monitor for progression. See ITP for goal review.  Reviewed home exercise with pt today.  Pt plans to walk and use stationary bike at home for exercise.  Reviewed THR, pulse, RPE, sign and symptoms, NTG use, and when to call 911 or MD.  Also discussed weather considerations and indoor options.  Pt voiced understanding.   Dr. Emily Filbert is Medical Director for [place] and [facility].

## 2017-08-03 ENCOUNTER — Ambulatory Visit
Admission: RE | Admit: 2017-08-03 | Discharge: 2017-08-03 | Disposition: A | Discharge status: Home / Self Care | Payer: BLUE CROSS/BLUE SHIELD | Source: Ambulatory Visit | Attending: Radiation Oncology | Admitting: Radiation Oncology

## 2017-08-03 DIAGNOSIS — K219 Gastro-esophageal reflux disease without esophagitis: Secondary | ICD-10-CM | POA: Diagnosis not present

## 2017-08-03 DIAGNOSIS — Z79899 Other long term (current) drug therapy: Secondary | ICD-10-CM | POA: Diagnosis not present

## 2017-08-03 DIAGNOSIS — I214 Non-ST elevation (NSTEMI) myocardial infarction: Secondary | ICD-10-CM

## 2017-08-03 DIAGNOSIS — Z510 Encounter for antineoplastic radiation therapy: Secondary | ICD-10-CM | POA: Diagnosis not present

## 2017-08-03 DIAGNOSIS — I10 Essential (primary) hypertension: Secondary | ICD-10-CM | POA: Diagnosis not present

## 2017-08-03 DIAGNOSIS — C50412 Malignant neoplasm of upper-outer quadrant of left female breast: Secondary | ICD-10-CM | POA: Diagnosis not present

## 2017-08-03 DIAGNOSIS — Z7982 Long term (current) use of aspirin: Secondary | ICD-10-CM | POA: Diagnosis not present

## 2017-08-03 DIAGNOSIS — M8580 Other specified disorders of bone density and structure, unspecified site: Secondary | ICD-10-CM | POA: Diagnosis not present

## 2017-08-03 DIAGNOSIS — Z170 Estrogen receptor positive status [ER+]: Secondary | ICD-10-CM | POA: Diagnosis not present

## 2017-08-03 NOTE — Progress Notes (Signed)
Daily Session Note  Patient Details  Name: TANEEKA CURTNER MRN: [id number] Date of Birth: May 14, 1955 Referring Provider:     Cardiac Rehab from 07/12/2017 in [facility] Cardiac and Pulmonary Rehab  Referring Provider  Gollan      Encounter Date: 08/03/2017  Check In: Session Check In - 08/03/17 0737      Check-In   Location  [facility]    Staff Present  Justin Mend RCP,RRT,BSRT;Heath Lark, RN, BSN, CCRP;Jessica Luan Pulling, MA, RCEP, CCRP, Exercise Physiologist    Supervising physician immediately available to respond to emergencies  See telemetry face sheet for immediately available ER MD    Medication changes reported      No    Fall or balance concerns reported     No    Warm-up and Cool-down  Performed on first and last piece of equipment    Resistance Training Performed  Yes    VAD Patient?  No      Pain Assessment   Currently in Pain?  No/denies          Social History   Tobacco Use  Smoking Status Never Smoker  Smokeless Tobacco Never Used    Goals Met:  Independence with exercise equipment Exercise tolerated well No report of cardiac concerns or symptoms Strength training completed today  Goals Unmet:  Not Applicable  Comments: Pt able to follow exercise prescription today without complaint.  Will continue to monitor for progression.   Dr. Emily Filbert is Medical Director for [place] and [facility].

## 2017-08-06 ENCOUNTER — Ambulatory Visit
Admission: RE | Admit: 2017-08-06 | Discharge: 2017-08-06 | Disposition: A | Discharge status: Home / Self Care | Payer: BLUE CROSS/BLUE SHIELD | Source: Ambulatory Visit | Attending: Radiation Oncology | Admitting: Radiation Oncology

## 2017-08-06 ENCOUNTER — Encounter: Payer: BLUE CROSS/BLUE SHIELD | Admitting: *Deleted

## 2017-08-06 DIAGNOSIS — Z170 Estrogen receptor positive status [ER+]: Secondary | ICD-10-CM | POA: Diagnosis not present

## 2017-08-06 DIAGNOSIS — M8580 Other specified disorders of bone density and structure, unspecified site: Secondary | ICD-10-CM | POA: Diagnosis not present

## 2017-08-06 DIAGNOSIS — Z510 Encounter for antineoplastic radiation therapy: Secondary | ICD-10-CM | POA: Diagnosis not present

## 2017-08-06 DIAGNOSIS — Z79899 Other long term (current) drug therapy: Secondary | ICD-10-CM | POA: Diagnosis not present

## 2017-08-06 DIAGNOSIS — K219 Gastro-esophageal reflux disease without esophagitis: Secondary | ICD-10-CM | POA: Diagnosis not present

## 2017-08-06 DIAGNOSIS — Z7982 Long term (current) use of aspirin: Secondary | ICD-10-CM | POA: Diagnosis not present

## 2017-08-06 DIAGNOSIS — I214 Non-ST elevation (NSTEMI) myocardial infarction: Secondary | ICD-10-CM

## 2017-08-06 DIAGNOSIS — I10 Essential (primary) hypertension: Secondary | ICD-10-CM | POA: Diagnosis not present

## 2017-08-06 DIAGNOSIS — C50412 Malignant neoplasm of upper-outer quadrant of left female breast: Secondary | ICD-10-CM | POA: Diagnosis not present

## 2017-08-06 NOTE — Progress Notes (Signed)
Daily Session Note  Patient Details  Name: Maria Barnes MRN: [id number] Date of Birth: [birth year] Referring Provider:     Cardiac Rehab from 07/12/2017 in [facility] Cardiac and Pulmonary Rehab  Referring Provider  Gollan      Encounter Date: 08/06/2017  Check In: Session Check In - 08/06/17 0747      Check-In   Location  [facility]    Staff Present  Joellyn Rued, BS, PEC;Meredith Willards, RN Moises Blood, BS, ACSM CEP, Exercise Physiologist    Supervising physician immediately available to respond to emergencies  See telemetry face sheet for immediately available ER MD    Medication changes reported      No    Fall or balance concerns reported     No    Tobacco Cessation  No Change    Warm-up and Cool-down  Performed on first and last piece of equipment    Resistance Training Performed  Yes    VAD Patient?  No    PAD/SET Patient?  No      Pain Assessment   Currently in Pain?  No/denies    Multiple Pain Sites  No          Social History   Tobacco Use  Smoking Status Never Smoker  Smokeless Tobacco Never Used    Goals Met:  Independence with exercise equipment Exercise tolerated well No report of cardiac concerns or symptoms Strength training completed today  Goals Unmet:  Not Applicable  Comments: Pt able to follow exercise prescription today without complaint.  Will continue to monitor for progression.    Dr. Emily Filbert is Medical Director for [place] and [facility].

## 2017-08-07 ENCOUNTER — Ambulatory Visit
Admission: RE | Admit: 2017-08-07 | Discharge: 2017-08-07 | Disposition: A | Discharge status: Home / Self Care | Payer: BLUE CROSS/BLUE SHIELD | Source: Ambulatory Visit | Attending: Radiation Oncology | Admitting: Radiation Oncology

## 2017-08-07 DIAGNOSIS — Z510 Encounter for antineoplastic radiation therapy: Secondary | ICD-10-CM | POA: Diagnosis not present

## 2017-08-07 DIAGNOSIS — Z170 Estrogen receptor positive status [ER+]: Secondary | ICD-10-CM | POA: Diagnosis not present

## 2017-08-07 DIAGNOSIS — C50412 Malignant neoplasm of upper-outer quadrant of left female breast: Secondary | ICD-10-CM | POA: Diagnosis not present

## 2017-08-08 ENCOUNTER — Ambulatory Visit
Admission: RE | Admit: 2017-08-08 | Discharge: 2017-08-08 | Disposition: A | Discharge status: Home / Self Care | Payer: BLUE CROSS/BLUE SHIELD | Source: Ambulatory Visit | Attending: Radiation Oncology | Admitting: Radiation Oncology

## 2017-08-08 DIAGNOSIS — Z7982 Long term (current) use of aspirin: Secondary | ICD-10-CM | POA: Diagnosis not present

## 2017-08-08 DIAGNOSIS — I10 Essential (primary) hypertension: Secondary | ICD-10-CM | POA: Diagnosis not present

## 2017-08-08 DIAGNOSIS — Z79899 Other long term (current) drug therapy: Secondary | ICD-10-CM | POA: Diagnosis not present

## 2017-08-08 DIAGNOSIS — C50412 Malignant neoplasm of upper-outer quadrant of left female breast: Secondary | ICD-10-CM | POA: Diagnosis not present

## 2017-08-08 DIAGNOSIS — K219 Gastro-esophageal reflux disease without esophagitis: Secondary | ICD-10-CM | POA: Diagnosis not present

## 2017-08-08 DIAGNOSIS — I214 Non-ST elevation (NSTEMI) myocardial infarction: Secondary | ICD-10-CM | POA: Diagnosis not present

## 2017-08-08 DIAGNOSIS — Z510 Encounter for antineoplastic radiation therapy: Secondary | ICD-10-CM | POA: Diagnosis not present

## 2017-08-08 DIAGNOSIS — M8580 Other specified disorders of bone density and structure, unspecified site: Secondary | ICD-10-CM | POA: Diagnosis not present

## 2017-08-08 DIAGNOSIS — Z170 Estrogen receptor positive status [ER+]: Secondary | ICD-10-CM | POA: Diagnosis not present

## 2017-08-08 NOTE — Progress Notes (Signed)
Daily Session Note  Patient Details  Name: Maria Barnes MRN: [id number] Date of Birth: [birth year] Referring Provider:     Cardiac Rehab from 07/12/2017 in [facility] Cardiac and Pulmonary Rehab  Referring Provider  Gollan      Encounter Date: 08/08/2017  Check In: Session Check In - 08/08/17 0745      Check-In   Location  [facility]    Staff Present  Justin Mend RCP,RRT,BSRT;Heath Lark, RN, BSN, CCRP;Nana Addai, RN BSN    Supervising physician immediately available to respond to emergencies  See telemetry face sheet for immediately available ER MD    Medication changes reported      No    Fall or balance concerns reported     No    Warm-up and Cool-down  Performed on first and last piece of equipment    Resistance Training Performed  Yes    VAD Patient?  No      Pain Assessment   Currently in Pain?  No/denies          Social History   Tobacco Use  Smoking Status Never Smoker  Smokeless Tobacco Never Used    Goals Met:  Independence with exercise equipment Personal goals reviewed No report of cardiac concerns or symptoms Strength training completed today  Goals Unmet:  Not Applicable  Comments: Pt able to follow exercise prescription today without complaint.  Will continue to monitor for progression.   Dr. Emily Filbert is Medical Director for [place] and [facility].

## 2017-08-09 ENCOUNTER — Ambulatory Visit
Admission: RE | Admit: 2017-08-09 | Discharge: 2017-08-09 | Disposition: A | Discharge status: Home / Self Care | Payer: BLUE CROSS/BLUE SHIELD | Source: Ambulatory Visit | Attending: Radiation Oncology | Admitting: Radiation Oncology

## 2017-08-09 DIAGNOSIS — C50412 Malignant neoplasm of upper-outer quadrant of left female breast: Secondary | ICD-10-CM | POA: Diagnosis not present

## 2017-08-09 DIAGNOSIS — Z510 Encounter for antineoplastic radiation therapy: Secondary | ICD-10-CM | POA: Diagnosis not present

## 2017-08-09 DIAGNOSIS — Z170 Estrogen receptor positive status [ER+]: Secondary | ICD-10-CM | POA: Diagnosis not present

## 2017-08-10 ENCOUNTER — Encounter: Payer: BLUE CROSS/BLUE SHIELD | Admitting: *Deleted

## 2017-08-10 ENCOUNTER — Ambulatory Visit
Admission: RE | Admit: 2017-08-10 | Discharge: 2017-08-10 | Disposition: A | Discharge status: Home / Self Care | Payer: BLUE CROSS/BLUE SHIELD | Source: Ambulatory Visit | Attending: Radiation Oncology | Admitting: Radiation Oncology

## 2017-08-10 DIAGNOSIS — M8580 Other specified disorders of bone density and structure, unspecified site: Secondary | ICD-10-CM | POA: Diagnosis not present

## 2017-08-10 DIAGNOSIS — C50412 Malignant neoplasm of upper-outer quadrant of left female breast: Secondary | ICD-10-CM | POA: Diagnosis not present

## 2017-08-10 DIAGNOSIS — I214 Non-ST elevation (NSTEMI) myocardial infarction: Secondary | ICD-10-CM | POA: Diagnosis not present

## 2017-08-10 DIAGNOSIS — Z7982 Long term (current) use of aspirin: Secondary | ICD-10-CM | POA: Diagnosis not present

## 2017-08-10 DIAGNOSIS — Z510 Encounter for antineoplastic radiation therapy: Secondary | ICD-10-CM | POA: Diagnosis not present

## 2017-08-10 DIAGNOSIS — K219 Gastro-esophageal reflux disease without esophagitis: Secondary | ICD-10-CM | POA: Diagnosis not present

## 2017-08-10 DIAGNOSIS — Z170 Estrogen receptor positive status [ER+]: Secondary | ICD-10-CM | POA: Diagnosis not present

## 2017-08-10 DIAGNOSIS — Z79899 Other long term (current) drug therapy: Secondary | ICD-10-CM | POA: Diagnosis not present

## 2017-08-10 DIAGNOSIS — I10 Essential (primary) hypertension: Secondary | ICD-10-CM | POA: Diagnosis not present

## 2017-08-10 NOTE — Progress Notes (Signed)
Daily Session Note  Patient Details  Name: LAIYLA SLAGEL MRN: [id number] Date of Birth: 04/08/1955 Referring Provider:     Cardiac Rehab from 07/12/2017 in [facility] Cardiac and Pulmonary Rehab  Referring Provider  Gollan      Encounter Date: 08/10/2017  Check In: Session Check In - 08/10/17 0745      Check-In   Location  [facility]    Staff Present  Heath Lark, RN, BSN, CCRP;Meredith Sherryll Burger, RN Vickki Hearing, BA, ACSM CEP, Exercise Physiologist    Supervising physician immediately available to respond to emergencies  See telemetry face sheet for immediately available ER MD    Medication changes reported      No    Fall or balance concerns reported     No    Tobacco Cessation  No Change    Warm-up and Cool-down  Performed on first and last piece of equipment    Resistance Training Performed  Yes    VAD Patient?  No      Pain Assessment   Currently in Pain?  No/denies          Social History   Tobacco Use  Smoking Status Never Smoker  Smokeless Tobacco Never Used    Goals Met:  Independence with exercise equipment Exercise tolerated well No report of cardiac concerns or symptoms Strength training completed today  Goals Unmet:  Not Applicable  Comments: Pt able to follow exercise prescription today without complaint.  Will continue to monitor for progression.    Dr. Emily Filbert is Medical Director for [place] and [facility].

## 2017-08-13 ENCOUNTER — Encounter: Payer: BLUE CROSS/BLUE SHIELD | Admitting: *Deleted

## 2017-08-13 ENCOUNTER — Inpatient Hospital Stay: Payer: BLUE CROSS/BLUE SHIELD | Attending: Radiation Oncology

## 2017-08-13 ENCOUNTER — Ambulatory Visit
Admission: RE | Admit: 2017-08-13 | Discharge: 2017-08-13 | Disposition: A | Discharge status: Home / Self Care | Payer: BLUE CROSS/BLUE SHIELD | Source: Ambulatory Visit | Attending: Radiation Oncology | Admitting: Radiation Oncology

## 2017-08-13 DIAGNOSIS — K219 Gastro-esophageal reflux disease without esophagitis: Secondary | ICD-10-CM | POA: Diagnosis not present

## 2017-08-13 DIAGNOSIS — C50412 Malignant neoplasm of upper-outer quadrant of left female breast: Secondary | ICD-10-CM | POA: Diagnosis not present

## 2017-08-13 DIAGNOSIS — Z7982 Long term (current) use of aspirin: Secondary | ICD-10-CM | POA: Diagnosis not present

## 2017-08-13 DIAGNOSIS — Z79899 Other long term (current) drug therapy: Secondary | ICD-10-CM | POA: Diagnosis not present

## 2017-08-13 DIAGNOSIS — I214 Non-ST elevation (NSTEMI) myocardial infarction: Secondary | ICD-10-CM

## 2017-08-13 DIAGNOSIS — M8580 Other specified disorders of bone density and structure, unspecified site: Secondary | ICD-10-CM | POA: Diagnosis not present

## 2017-08-13 DIAGNOSIS — Z510 Encounter for antineoplastic radiation therapy: Secondary | ICD-10-CM | POA: Diagnosis not present

## 2017-08-13 DIAGNOSIS — I10 Essential (primary) hypertension: Secondary | ICD-10-CM | POA: Diagnosis not present

## 2017-08-13 DIAGNOSIS — Z170 Estrogen receptor positive status [ER+]: Secondary | ICD-10-CM

## 2017-08-13 LAB — CBC
HCT: 38.7 % (ref 35.0–47.0)
HEMOGLOBIN: 13 g/dL (ref 12.0–16.0)
MCH: 31.1 pg (ref 26.0–34.0)
MCHC: 33.7 g/dL (ref 32.0–36.0)
MCV: 92.3 fL (ref 80.0–100.0)
PLATELETS: 259 10*3/uL (ref 150–440)
RBC: 4.2 MIL/uL (ref 3.80–5.20)
RDW: 13.8 % (ref 11.5–14.5)
WBC: 5.8 10*3/uL (ref 3.6–11.0)

## 2017-08-13 NOTE — Progress Notes (Signed)
Daily Session Note  Patient Details  Name: Maria Barnes MRN: [id number] Date of Birth: 12-16-1955 Referring Provider:     Cardiac Rehab from 07/12/2017 in [facility] Cardiac and Pulmonary Rehab  Referring Provider  Gollan      Encounter Date: 08/13/2017  Check In: Session Check In - 08/13/17 0802      Check-In   Location  [facility]    Staff Present  Constance Goltz, RN Moises Blood, BS, ACSM CEP, Exercise Physiologist;Jessica Luan Pulling, [place], RCEP, CCRP, Exercise Physiologist    Supervising physician immediately available to respond to emergencies  See telemetry face sheet for immediately available ER MD    Medication changes reported      No    Fall or balance concerns reported     No    Tobacco Cessation  No Change    Warm-up and Cool-down  Performed on first and last piece of equipment    Resistance Training Performed  Yes    VAD Patient?  No    PAD/SET Patient?  No      Pain Assessment   Currently in Pain?  No/denies    Multiple Pain Sites  No          Social History   Tobacco Use  Smoking Status Never Smoker  Smokeless Tobacco Never Used    Goals Met:  Independence with exercise equipment Exercise tolerated well No report of cardiac concerns or symptoms Strength training completed today  Goals Unmet:  Not Applicable  Comments: Pt able to follow exercise prescription today without complaint.  Will continue to monitor for progression.    Dr. Emily Filbert is Medical Director for [place] and [facility].

## 2017-08-14 ENCOUNTER — Ambulatory Visit
Admission: RE | Admit: 2017-08-14 | Discharge: 2017-08-14 | Disposition: A | Discharge status: Home / Self Care | Payer: BLUE CROSS/BLUE SHIELD | Source: Ambulatory Visit | Attending: Radiation Oncology | Admitting: Radiation Oncology

## 2017-08-14 DIAGNOSIS — Z510 Encounter for antineoplastic radiation therapy: Secondary | ICD-10-CM | POA: Diagnosis not present

## 2017-08-14 DIAGNOSIS — Z170 Estrogen receptor positive status [ER+]: Secondary | ICD-10-CM | POA: Diagnosis not present

## 2017-08-14 DIAGNOSIS — C50412 Malignant neoplasm of upper-outer quadrant of left female breast: Secondary | ICD-10-CM | POA: Diagnosis not present

## 2017-08-15 ENCOUNTER — Encounter: Payer: Self-pay | Admitting: *Deleted

## 2017-08-15 ENCOUNTER — Ambulatory Visit
Admission: RE | Admit: 2017-08-15 | Discharge: 2017-08-15 | Disposition: A | Discharge status: Home / Self Care | Payer: BLUE CROSS/BLUE SHIELD | Source: Ambulatory Visit | Attending: Radiation Oncology | Admitting: Radiation Oncology

## 2017-08-15 DIAGNOSIS — Z510 Encounter for antineoplastic radiation therapy: Secondary | ICD-10-CM | POA: Diagnosis not present

## 2017-08-15 DIAGNOSIS — I214 Non-ST elevation (NSTEMI) myocardial infarction: Secondary | ICD-10-CM | POA: Diagnosis not present

## 2017-08-15 DIAGNOSIS — M8580 Other specified disorders of bone density and structure, unspecified site: Secondary | ICD-10-CM | POA: Diagnosis not present

## 2017-08-15 DIAGNOSIS — I10 Essential (primary) hypertension: Secondary | ICD-10-CM | POA: Diagnosis not present

## 2017-08-15 DIAGNOSIS — K219 Gastro-esophageal reflux disease without esophagitis: Secondary | ICD-10-CM | POA: Diagnosis not present

## 2017-08-15 DIAGNOSIS — Z79899 Other long term (current) drug therapy: Secondary | ICD-10-CM | POA: Diagnosis not present

## 2017-08-15 DIAGNOSIS — C50412 Malignant neoplasm of upper-outer quadrant of left female breast: Secondary | ICD-10-CM | POA: Diagnosis not present

## 2017-08-15 DIAGNOSIS — Z7982 Long term (current) use of aspirin: Secondary | ICD-10-CM | POA: Diagnosis not present

## 2017-08-15 DIAGNOSIS — Z170 Estrogen receptor positive status [ER+]: Secondary | ICD-10-CM | POA: Diagnosis not present

## 2017-08-15 NOTE — Progress Notes (Signed)
Daily Session Note  Patient Details  Name: DONIKA BUTNER MRN: [id number] Date of Birth: 23-Sep-1955 Referring Provider:     Cardiac Rehab from 07/12/2017 in [facility] Cardiac and Pulmonary Rehab  Referring Provider  Gollan      Encounter Date: 08/15/2017  Check In: Session Check In - 08/15/17 0736      Check-In   Location  [facility]    Staff Present  Justin Mend [place] Nick, [place], RCEP, CCRP, Exercise Physiologist;Susanne Bice, RN, BSN, CCRP    Supervising physician immediately available to respond to emergencies  See telemetry face sheet for immediately available ER MD    Medication changes reported      No    Fall or balance concerns reported     No    Warm-up and Cool-down  Performed on first and last piece of equipment    Resistance Training Performed  Yes    VAD Patient?  No      Pain Assessment   Currently in Pain?  No/denies          Social History   Tobacco Use  Smoking Status Never Smoker  Smokeless Tobacco Never Used    Goals Met:  Independence with exercise equipment Exercise tolerated well No report of cardiac concerns or symptoms Strength training completed today  Goals Unmet:  Not Applicable  Comments: Pt able to follow exercise prescription today without complaint.  Will continue to monitor for progression.   Dr. Emily Filbert is Medical Director for [place] and [facility].

## 2017-08-15 NOTE — Progress Notes (Signed)
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  Learning Barriers/Preferences: Learning Barriers/Preferences - 07/12/17 1156      Learning Barriers/Preferences   Learning Barriers  None    Learning Preferences  None       Education Topics:  AED/CPR: - Group verbal and written instruction with the use of models to demonstrate the basic use of the AED with the basic ABC's of resuscitation.   General Nutrition Guidelines/Fats and Fiber: -Group instruction provided by verbal, written material, models and posters to present the general guidelines for heart healthy nutrition. Gives an explanation and review of dietary fats and fiber.   Cardiac Rehab from 08/13/2017 in [facility] Cardiac and Pulmonary Rehab  Date  08/13/17  Educator  CR  Instruction Review Code  1- Verbalizes Understanding      Controlling Sodium/Reading Food Labels: -Group verbal and written material supporting the discussion of sodium use in heart healthy  nutrition. Review and explanation with models, verbal and written materials for utilization of the food label.   Exercise Physiology & General Exercise Guidelines: - Group verbal and written instruction with models to review the exercise physiology of the cardiovascular system and associated critical values. Provides general exercise guidelines with specific guidelines to those with heart or lung disease.    Aerobic Exercise & Resistance Training: - Gives group verbal and written instruction on the various components of exercise. Focuses on aerobic and resistive training programs and the benefits of this training and how to safely progress through these programs..   Flexibility, Balance, Mind/Body Relaxation: Provides group verbal/written instruction on the benefits of flexibility and balance training, including mind/body exercise modes such as yoga, pilates and tai chi.  Demonstration and skill practice provided.   Cardiac Rehab from 08/13/2017 in [facility] Cardiac and Pulmonary Rehab  Date  07/18/17  Educator  [facility]  Instruction Review Code  1- Verbalizes Understanding      Stress and Anxiety: - Provides group verbal and written instruction about the health risks of elevated stress and causes of high stress.  Discuss the correlation between heart/lung disease and anxiety and treatment options. Review healthy ways to manage with stress and anxiety.   Cardiac Rehab from 08/13/2017 in [facility] Cardiac and Pulmonary Rehab  Date  08/08/17  Educator  [facility]  Instruction Review Code  1- Verbalizes Understanding      Depression: - Provides group verbal and written instruction on the correlation between heart/lung disease and depressed mood, treatment options, and the stigmas associated with seeking treatment.   Anatomy & Physiology of the Heart: - Group verbal and written instruction and models provide basic cardiac anatomy and physiology, with the coronary electrical and arterial systems. Review of  Valvular disease and Heart Failure   Cardiac Rehab from 08/13/2017 in [facility] Cardiac and Pulmonary Rehab  Date  08/06/17  Educator  [facility]  Instruction Review Code  1- Verbalizes Understanding      Cardiac Procedures: - Group verbal  and written instruction to review commonly prescribed medications for heart disease. Reviews the medication, class of the drug, and side effects. Includes the steps to properly store meds and maintain the prescription regimen. (beta blockers and nitrates)   Cardiac Rehab from 08/13/2017 in [facility] Cardiac and Pulmonary Rehab  Date  07/30/17  Educator  SB  Instruction Review Code  1- Verbalizes Understanding      Cardiac Medications I: - Group verbal and written instruction to review commonly prescribed medications for heart disease. Reviews the medication, class of the drug, and side effects. Includes the steps to properly store meds and maintain the prescription regimen.   Cardiac Rehab from 08/13/2017 in [facility] Cardiac and Pulmonary Rehab  Date  07/23/17  Educator  CE  Instruction Review Code  1- Verbalizes Understanding      Cardiac Medications II: -Group verbal and written instruction to review commonly prescribed medications for heart disease. Reviews the medication, class of the drug, and side effects. (all other drug classes)    Go Sex-Intimacy & Heart Disease, Get SMART - Goal Setting: - Group verbal and written instruction through game format to discuss heart disease and the return to sexual intimacy. Provides group verbal and written material to discuss and apply goal setting through the application of the S.M.A.R.T. Method.   Cardiac Rehab from 08/13/2017 in [facility] Cardiac and Pulmonary Rehab  Date  07/30/17  Educator  SB  Instruction Review Code  1- Verbalizes Understanding      Other Matters of the Heart: - Provides group verbal, written materials and models to describe Stable Angina and Peripheral Artery. Includes description of the disease process  and treatment options available to the cardiac patient.   Exercise & Equipment Safety: - Individual verbal instruction and demonstration of equipment use and safety with use of the equipment.   Cardiac Rehab from 08/13/2017 in [facility] Cardiac and Pulmonary Rehab  Date  07/12/17  Educator  [facility]  Instruction Review Code  1- Verbalizes Understanding      Infection Prevention: - Provides verbal and written material to individual with discussion of infection control including proper hand washing and proper equipment cleaning during exercise session.   Cardiac Rehab from 08/13/2017 in [facility] Cardiac and Pulmonary Rehab  Date  07/12/17  Educator  [facility]  Instruction Review Code  1- Verbalizes Understanding      Falls Prevention: - Provides verbal and written material to individual with discussion of falls prevention and safety.   Cardiac Rehab from 08/13/2017 in [facility] Cardiac and Pulmonary Rehab  Date  07/12/17  Educator  [facility]  Instruction Review Code  1- Verbalizes Understanding      Diabetes: - Individual verbal and written instruction to review signs/symptoms of diabetes, desired ranges of glucose level fasting, after meals and with exercise. Acknowledge that pre and post exercise glucose checks will be done for 3 sessions at entry of program.   Know Your Numbers and Risk Factors: -Group verbal and written instruction about important numbers in your health.  Discussion of what are risk factors and how they play a role in the disease process.  Review of Cholesterol, Blood Pressure, Diabetes, and BMI and the role they play in your overall health.   Sleep Hygiene: -Provides group verbal and written instruction about how sleep can affect your health.  Define sleep hygiene, discuss sleep cycles and impact of sleep habits. Review good sleep hygiene tips.    Other: -Provides group and verbal instruction on various topics (see comments)  Knowledge Questionnaire Score: Knowledge Questionnaire Score  - 07/12/17 1155      Knowledge Questionnaire Score   Pre Score  23/26 Correct responses were reviewed with Rodena Piety today. She verbalized understanding of the responses and had no further questions today       Core Components/Risk Factors/Patient Goals at Admission: Personal Goals and Risk Factors at Admission - 07/12/17 1450      Core Components/Risk Factors/Patient Goals on Admission    Weight Management  Yes;Obesity;Weight Loss    Intervention  Weight Management: Develop a combined nutrition and exercise program designed to reach desired caloric intake, while maintaining appropriate intake of nutrient and fiber, sodium and fats, and appropriate energy expenditure required for the weight goal.    Admit Weight  157 lb 11.2 oz (71.5 kg)    Goal Weight: Short Term  155 lb (70.3 kg)    Goal Weight: Long Term  140 lb (63.5 kg)    Expected Outcomes  Short Term: Continue to assess and modify interventions until short term weight is achieved;Long Term: Adherence to nutrition and physical activity/exercise program aimed toward attainment of established weight goal;Weight Loss: Understanding of general recommendations for a balanced deficit meal plan, which promotes 1-2 lb weight loss per week and includes a negative energy balance of [phone] kcal/d    Hypertension  Yes    Intervention  Provide education on lifestyle modifcations including regular physical activity/exercise, weight management, moderate sodium restriction and increased consumption of fresh fruit, vegetables, and low fat dairy, alcohol moderation, and smoking cessation.;Monitor prescription use compliance.    Expected Outcomes  Short Term: Continued assessment and intervention until BP is < 140/88m HG in hypertensive participants. < 130/867mHG in hypertensive participants with diabetes, heart failure or chronic kidney disease.;Long Term: Maintenance of blood pressure at goal levels.    Lipids  Yes    Intervention  Provide education and  support for participant on nutrition & aerobic/resistive exercise along with prescribed medications to achieve LDL <7071mHDL >97m72m  Expected Outcomes  Short Term: Participant states understanding of desired cholesterol values and is compliant with medications prescribed. Participant is following exercise prescription and nutrition guidelines.;Long Term: Cholesterol controlled with medications as prescribed, with individualized exercise RX and with personalized nutrition plan. Value goals: LDL < 70mg61mL > 40 mg.    Stress  Yes    Intervention  Offer individual and/or small group education and counseling on adjustment to heart disease, stress management and health-related lifestyle change. Teach and support self-help strategies.;Refer participants experiencing significant psychosocial distress to appropriate mental health specialists for further evaluation and treatment. When possible, include family members and significant others in education/counseling sessions.    Expected Outcomes  Short Term: Participant demonstrates changes in health-related behavior, relaxation and other stress management skills, ability to obtain effective social support, and compliance with psychotropic medications if prescribed.;Long Term: Emotional wellbeing is indicated by absence of clinically significant psychosocial distress or social isolation.       Core Components/Risk Factors/Patient Goals Review:  Goals and Risk Factor Review    Row Name 08/01/17 0802             Core Components/Risk Factors/Patient Goals Review   Personal Goals Review  Weight Management/Obesity;Hypertension;Lipids       Review  Aaradhya's weight has been holding steady.  She is more mindful about what she is eating.  Blood pressures have been good.  She checks them 2-3x a week at home and has us [place] it in  class.   She is doing well on her medications.        Expected Outcomes  Short: Continue to work on weight loss. Long: Continue to  monitor pressures.           Core Components/Risk Factors/Patient Goals at Discharge (Final Review):  Goals and Risk Factor Review - 08/01/17 0802      Core Components/Risk Factors/Patient Goals Review   Personal Goals Review  Weight Management/Obesity;Hypertension;Lipids    Review  Jaz's weight has been holding steady.  She is more mindful about what she is eating.  Blood pressures have been good.  She checks them 2-3x a week at home and has [place] check it in class.   She is doing well on her medications.     Expected Outcomes  Short: Continue to work on weight loss. Long: Continue to monitor pressures.        ITP Comments: ITP Comments    Row Name 07/12/17 1159 07/18/17 0540 08/15/17 0525       ITP Comments  Medical review completed. ITP sent to Dr Loleta Chance for review, changes as needed and signature.  Documentation of diagnosis can be found in [facility]5/23/2019  30 day review. Continue with ITP unless directed changes per Medical Director review.  HAs attended orientation only  30 day review. Continue with ITP unless directed changes per Medical Director review.          Comments: 30 day review. Continue with ITP unless directed changes per Medical Director review.

## 2017-08-16 ENCOUNTER — Ambulatory Visit
Admission: RE | Admit: 2017-08-16 | Discharge: 2017-08-16 | Disposition: A | Discharge status: Home / Self Care | Payer: BLUE CROSS/BLUE SHIELD | Source: Ambulatory Visit | Attending: Radiation Oncology | Admitting: Radiation Oncology

## 2017-08-16 ENCOUNTER — Telehealth: Payer: Self-pay | Admitting: *Deleted

## 2017-08-16 DIAGNOSIS — C50412 Malignant neoplasm of upper-outer quadrant of left female breast: Secondary | ICD-10-CM | POA: Diagnosis not present

## 2017-08-16 DIAGNOSIS — Z510 Encounter for antineoplastic radiation therapy: Secondary | ICD-10-CM | POA: Diagnosis not present

## 2017-08-16 DIAGNOSIS — Z170 Estrogen receptor positive status [ER+]: Secondary | ICD-10-CM | POA: Diagnosis not present

## 2017-08-16 NOTE — Telephone Encounter (Signed)
Patient would like to speak with Rodena Piety regarding her cancer diagnosis for insurance.

## 2017-08-16 NOTE — Telephone Encounter (Signed)
Returned patient's call.  She is asking what determines the diagnosis.  Discussed with her that it would be her pathology report.  She states she has that so will resubmit to her insurance company.  Advised her to call back if she needs any other information.

## 2017-08-17 ENCOUNTER — Ambulatory Visit
Admission: RE | Admit: 2017-08-17 | Discharge: 2017-08-17 | Disposition: A | Discharge status: Home / Self Care | Payer: BLUE CROSS/BLUE SHIELD | Source: Ambulatory Visit | Attending: Radiation Oncology | Admitting: Radiation Oncology

## 2017-08-17 DIAGNOSIS — I214 Non-ST elevation (NSTEMI) myocardial infarction: Secondary | ICD-10-CM

## 2017-08-17 DIAGNOSIS — M8580 Other specified disorders of bone density and structure, unspecified site: Secondary | ICD-10-CM | POA: Diagnosis not present

## 2017-08-17 DIAGNOSIS — Z170 Estrogen receptor positive status [ER+]: Secondary | ICD-10-CM | POA: Diagnosis not present

## 2017-08-17 DIAGNOSIS — C50412 Malignant neoplasm of upper-outer quadrant of left female breast: Secondary | ICD-10-CM | POA: Diagnosis not present

## 2017-08-17 DIAGNOSIS — I10 Essential (primary) hypertension: Secondary | ICD-10-CM | POA: Diagnosis not present

## 2017-08-17 DIAGNOSIS — Z510 Encounter for antineoplastic radiation therapy: Secondary | ICD-10-CM | POA: Diagnosis not present

## 2017-08-17 DIAGNOSIS — Z79899 Other long term (current) drug therapy: Secondary | ICD-10-CM | POA: Diagnosis not present

## 2017-08-17 DIAGNOSIS — K219 Gastro-esophageal reflux disease without esophagitis: Secondary | ICD-10-CM | POA: Diagnosis not present

## 2017-08-17 DIAGNOSIS — Z7982 Long term (current) use of aspirin: Secondary | ICD-10-CM | POA: Diagnosis not present

## 2017-08-17 NOTE — Progress Notes (Signed)
Daily Session Note  Patient Details  Name: Maria Barnes MRN: [id number] Date of Birth: 1955/02/10 Referring Provider:     Cardiac Rehab from 07/12/2017 in [facility] Cardiac and Pulmonary Rehab  Referring Provider  Gollan      Encounter Date: 08/17/2017  Check In: Session Check In - 08/17/17 1749      Check-In   Location  [facility]-Cardiac & Pulmonary Rehab    Staff Present  Alberteen Sam, MA, RCEP, CCRP, Exercise Physiologist;Meredith Sherryll Burger, RN Vickki Hearing, BA, ACSM CEP, Exercise Physiologist    Supervising physician immediately available to respond to emergencies  See telemetry face sheet for immediately available ER MD    Medication changes reported      No    Fall or balance concerns reported     No    Warm-up and Cool-down  Performed on first and last piece of equipment    Resistance Training Performed  Yes    VAD Patient?  No    PAD/SET Patient?  No      Pain Assessment   Currently in Pain?  No/denies    Multiple Pain Sites  No          Social History   Tobacco Use  Smoking Status Never Smoker  Smokeless Tobacco Never Used    Goals Met:  Independence with exercise equipment Exercise tolerated well No report of cardiac concerns or symptoms Strength training completed today  Goals Unmet:  Not Applicable  Comments: Pt able to follow exercise prescription today without complaint.  Will continue to monitor for progression.    Dr. Emily Filbert is Medical Director for [place] and [facility].

## 2017-08-20 ENCOUNTER — Encounter: Payer: BLUE CROSS/BLUE SHIELD | Admitting: *Deleted

## 2017-08-20 ENCOUNTER — Ambulatory Visit
Admission: RE | Admit: 2017-08-20 | Discharge: 2017-08-20 | Disposition: A | Discharge status: Home / Self Care | Payer: BLUE CROSS/BLUE SHIELD | Source: Ambulatory Visit | Attending: Radiation Oncology | Admitting: Radiation Oncology

## 2017-08-20 DIAGNOSIS — K219 Gastro-esophageal reflux disease without esophagitis: Secondary | ICD-10-CM | POA: Diagnosis not present

## 2017-08-20 DIAGNOSIS — I10 Essential (primary) hypertension: Secondary | ICD-10-CM | POA: Diagnosis not present

## 2017-08-20 DIAGNOSIS — Z510 Encounter for antineoplastic radiation therapy: Secondary | ICD-10-CM | POA: Diagnosis not present

## 2017-08-20 DIAGNOSIS — Z79899 Other long term (current) drug therapy: Secondary | ICD-10-CM | POA: Diagnosis not present

## 2017-08-20 DIAGNOSIS — Z7982 Long term (current) use of aspirin: Secondary | ICD-10-CM | POA: Diagnosis not present

## 2017-08-20 DIAGNOSIS — I214 Non-ST elevation (NSTEMI) myocardial infarction: Secondary | ICD-10-CM | POA: Diagnosis not present

## 2017-08-20 DIAGNOSIS — C50412 Malignant neoplasm of upper-outer quadrant of left female breast: Secondary | ICD-10-CM | POA: Diagnosis not present

## 2017-08-20 DIAGNOSIS — Z170 Estrogen receptor positive status [ER+]: Secondary | ICD-10-CM | POA: Diagnosis not present

## 2017-08-20 DIAGNOSIS — M8580 Other specified disorders of bone density and structure, unspecified site: Secondary | ICD-10-CM | POA: Diagnosis not present

## 2017-08-20 NOTE — Progress Notes (Signed)
Daily Session Note  Patient Details  Name: Maria Barnes MRN: [id number] Date of Birth: 1956/01/31 Referring Provider:     Cardiac Rehab from 07/12/2017 in [facility] Cardiac and Pulmonary Rehab  Referring Provider  Gollan      Encounter Date: 08/20/2017  Check In: Session Check In - 08/20/17 0817      Check-In   Location  [facility]-Cardiac & Pulmonary Rehab    Staff Present  Heath Lark, RN, BSN, CCRP;Kristalynn Coddington Luan Pulling, MA, RCEP, CCRP, Exercise Physiologist;Kelly Amedeo Plenty, BS, ACSM CEP, Exercise Physiologist    Supervising physician immediately available to respond to emergencies  See telemetry face sheet for immediately available ER MD    Medication changes reported      No    Fall or balance concerns reported     No    Warm-up and Cool-down  Performed on first and last piece of equipment    Resistance Training Performed  Yes    VAD Patient?  No    PAD/SET Patient?  No      Pain Assessment   Currently in Pain?  No/denies          Social History   Tobacco Use  Smoking Status Never Smoker  Smokeless Tobacco Never Used    Goals Met:  Independence with exercise equipment Exercise tolerated well No report of cardiac concerns or symptoms Strength training completed today  Goals Unmet:  Not Applicable  Comments: Pt able to follow exercise prescription today without complaint.  Will continue to monitor for progression.    Dr. Emily Filbert is Medical Director for [place] and [facility].

## 2017-08-21 ENCOUNTER — Other Ambulatory Visit
Admission: RE | Admit: 2017-08-21 | Discharge: 2017-08-21 | Disposition: A | Discharge status: Home / Self Care | Payer: BLUE CROSS/BLUE SHIELD | Source: Ambulatory Visit | Attending: Nurse Practitioner | Admitting: Nurse Practitioner

## 2017-08-21 ENCOUNTER — Ambulatory Visit
Admission: RE | Admit: 2017-08-21 | Discharge: 2017-08-21 | Disposition: A | Discharge status: Home / Self Care | Payer: BLUE CROSS/BLUE SHIELD | Source: Ambulatory Visit | Attending: Radiation Oncology | Admitting: Radiation Oncology

## 2017-08-21 DIAGNOSIS — E782 Mixed hyperlipidemia: Secondary | ICD-10-CM

## 2017-08-21 DIAGNOSIS — I10 Essential (primary) hypertension: Secondary | ICD-10-CM | POA: Diagnosis not present

## 2017-08-21 DIAGNOSIS — I214 Non-ST elevation (NSTEMI) myocardial infarction: Secondary | ICD-10-CM

## 2017-08-21 DIAGNOSIS — Z510 Encounter for antineoplastic radiation therapy: Secondary | ICD-10-CM | POA: Diagnosis not present

## 2017-08-21 DIAGNOSIS — Z170 Estrogen receptor positive status [ER+]: Secondary | ICD-10-CM | POA: Diagnosis not present

## 2017-08-21 DIAGNOSIS — C50412 Malignant neoplasm of upper-outer quadrant of left female breast: Secondary | ICD-10-CM | POA: Diagnosis not present

## 2017-08-21 LAB — HEPATIC FUNCTION PANEL
ALK PHOS: 93 U/L (ref 38–126)
ALT: 38 U/L (ref 0–44)
AST: 29 U/L (ref 15–41)
Albumin: 4.4 g/dL (ref 3.5–5.0)
BILIRUBIN DIRECT: 0.2 mg/dL (ref 0.0–0.2)
BILIRUBIN TOTAL: 1.4 mg/dL — AB (ref 0.3–1.2)
Indirect Bilirubin: 1.2 mg/dL — ABNORMAL HIGH (ref 0.3–0.9)
Total Protein: 8.1 g/dL (ref 6.5–8.1)

## 2017-08-21 LAB — LIPID PANEL
Cholesterol: 125 mg/dL (ref 0–200)
HDL: 40 mg/dL — ABNORMAL LOW (ref >40)
LDL CALC: 53 mg/dL (ref 0–99)
TRIGLYCERIDES: 162 mg/dL — AB (ref <150)
Total CHOL/HDL Ratio: 3.1 RATIO
VLDL: 32 mg/dL (ref 0–40)

## 2017-08-22 ENCOUNTER — Ambulatory Visit
Admission: RE | Admit: 2017-08-22 | Discharge: 2017-08-22 | Disposition: A | Discharge status: Home / Self Care | Payer: BLUE CROSS/BLUE SHIELD | Source: Ambulatory Visit | Attending: Radiation Oncology | Admitting: Radiation Oncology

## 2017-08-22 DIAGNOSIS — I10 Essential (primary) hypertension: Secondary | ICD-10-CM | POA: Diagnosis not present

## 2017-08-22 DIAGNOSIS — K219 Gastro-esophageal reflux disease without esophagitis: Secondary | ICD-10-CM | POA: Diagnosis not present

## 2017-08-22 DIAGNOSIS — I214 Non-ST elevation (NSTEMI) myocardial infarction: Secondary | ICD-10-CM

## 2017-08-22 DIAGNOSIS — M8580 Other specified disorders of bone density and structure, unspecified site: Secondary | ICD-10-CM | POA: Diagnosis not present

## 2017-08-22 DIAGNOSIS — Z170 Estrogen receptor positive status [ER+]: Secondary | ICD-10-CM | POA: Diagnosis not present

## 2017-08-22 DIAGNOSIS — Z510 Encounter for antineoplastic radiation therapy: Secondary | ICD-10-CM | POA: Diagnosis not present

## 2017-08-22 DIAGNOSIS — C50412 Malignant neoplasm of upper-outer quadrant of left female breast: Secondary | ICD-10-CM | POA: Diagnosis not present

## 2017-08-22 DIAGNOSIS — Z7982 Long term (current) use of aspirin: Secondary | ICD-10-CM | POA: Diagnosis not present

## 2017-08-22 DIAGNOSIS — Z79899 Other long term (current) drug therapy: Secondary | ICD-10-CM | POA: Diagnosis not present

## 2017-08-22 NOTE — Progress Notes (Signed)
Daily Session Note  Patient Details  Name: Maria Barnes MRN: [id number] Date of Birth: Jul 09, 1955 Referring Provider:     Cardiac Rehab from 07/12/2017 in [facility] Cardiac and Pulmonary Rehab  Referring Provider  Gollan      Encounter Date: 08/22/2017  Check In: Session Check In - 08/22/17 0753      Check-In   Location  [facility]-Cardiac & Pulmonary Rehab    Staff Present  Justin Mend RCP,RRT,BSRT;Heath Lark, RN, BSN, CCRP;Jessica Luan Pulling, MA, RCEP, CCRP, Exercise Physiologist    Supervising physician immediately available to respond to emergencies  See telemetry face sheet for immediately available ER MD    Medication changes reported      No    Fall or balance concerns reported     No    Warm-up and Cool-down  Performed on first and last piece of equipment    Resistance Training Performed  Yes    VAD Patient?  No      Pain Assessment   Currently in Pain?  No/denies          Social History   Tobacco Use  Smoking Status Never Smoker  Smokeless Tobacco Never Used    Goals Met:  Independence with exercise equipment Exercise tolerated well Personal goals reviewed No report of cardiac concerns or symptoms Strength training completed today  Goals Unmet:  Not Applicable  Comments: Pt able to follow exercise prescription today without complaint.  Will continue to monitor for progression.   Dr. Emily Filbert is Medical Director for [place] and [facility].

## 2017-08-23 ENCOUNTER — Ambulatory Visit
Admission: RE | Admit: 2017-08-23 | Discharge: 2017-08-23 | Disposition: A | Discharge status: Home / Self Care | Payer: BLUE CROSS/BLUE SHIELD | Source: Ambulatory Visit | Attending: Radiation Oncology | Admitting: Radiation Oncology

## 2017-08-23 DIAGNOSIS — Z170 Estrogen receptor positive status [ER+]: Secondary | ICD-10-CM | POA: Diagnosis not present

## 2017-08-23 DIAGNOSIS — C50412 Malignant neoplasm of upper-outer quadrant of left female breast: Secondary | ICD-10-CM | POA: Diagnosis not present

## 2017-08-23 DIAGNOSIS — Z510 Encounter for antineoplastic radiation therapy: Secondary | ICD-10-CM | POA: Diagnosis not present

## 2017-08-24 ENCOUNTER — Encounter: Payer: BLUE CROSS/BLUE SHIELD | Admitting: *Deleted

## 2017-08-24 ENCOUNTER — Ambulatory Visit
Admission: RE | Admit: 2017-08-24 | Discharge: 2017-08-24 | Disposition: A | Discharge status: Home / Self Care | Payer: BLUE CROSS/BLUE SHIELD | Source: Ambulatory Visit | Attending: Radiation Oncology | Admitting: Radiation Oncology

## 2017-08-24 DIAGNOSIS — I214 Non-ST elevation (NSTEMI) myocardial infarction: Secondary | ICD-10-CM

## 2017-08-24 DIAGNOSIS — K219 Gastro-esophageal reflux disease without esophagitis: Secondary | ICD-10-CM | POA: Diagnosis not present

## 2017-08-24 DIAGNOSIS — Z7982 Long term (current) use of aspirin: Secondary | ICD-10-CM | POA: Diagnosis not present

## 2017-08-24 DIAGNOSIS — Z510 Encounter for antineoplastic radiation therapy: Secondary | ICD-10-CM | POA: Diagnosis not present

## 2017-08-24 DIAGNOSIS — C50412 Malignant neoplasm of upper-outer quadrant of left female breast: Secondary | ICD-10-CM | POA: Diagnosis not present

## 2017-08-24 DIAGNOSIS — M8580 Other specified disorders of bone density and structure, unspecified site: Secondary | ICD-10-CM | POA: Diagnosis not present

## 2017-08-24 DIAGNOSIS — Z79899 Other long term (current) drug therapy: Secondary | ICD-10-CM | POA: Diagnosis not present

## 2017-08-24 DIAGNOSIS — I10 Essential (primary) hypertension: Secondary | ICD-10-CM | POA: Diagnosis not present

## 2017-08-24 DIAGNOSIS — Z170 Estrogen receptor positive status [ER+]: Secondary | ICD-10-CM | POA: Diagnosis not present

## 2017-08-24 NOTE — Progress Notes (Signed)
Daily Session Note  Patient Details  Name: Maria Barnes MRN: [id number] Date of Birth: Oct 25, 1955 Referring Provider:     Cardiac Rehab from 07/12/2017 in [facility] Cardiac and Pulmonary Rehab  Referring Provider  Gollan      Encounter Date: 08/24/2017  Check In: Session Check In - 08/24/17 0815      Check-In   Supervising physician immediately available to respond to emergencies  See telemetry face sheet for immediately available ER MD    Location  [facility]-Cardiac & Pulmonary Rehab    Staff Present  Renita Papa, RN BSN;Nadalee Neiswender Luan Pulling, MA, RCEP, CCRP, Exercise Physiologist;Amanda Oletta Darter, [place], ACSM CEP, Exercise Physiologist    Medication changes reported      No    Fall or balance concerns reported     No    Warm-up and Cool-down  Performed on first and last piece of equipment    Resistance Training Performed  Yes    VAD Patient?  No      Pain Assessment   Currently in Pain?  No/denies          Social History   Tobacco Use  Smoking Status Never Smoker  Smokeless Tobacco Never Used    Goals Met:  Independence with exercise equipment Exercise tolerated well No report of cardiac concerns or symptoms Strength training completed today  Goals Unmet:  Not Applicable  Comments: Pt able to follow exercise prescription today without complaint.  Will continue to monitor for progression.    Dr. Emily Filbert is Medical Director for [place] and [facility].

## 2017-08-27 ENCOUNTER — Encounter: Payer: BLUE CROSS/BLUE SHIELD | Admitting: *Deleted

## 2017-08-27 ENCOUNTER — Other Ambulatory Visit: Payer: Self-pay

## 2017-08-27 ENCOUNTER — Inpatient Hospital Stay: Payer: BLUE CROSS/BLUE SHIELD

## 2017-08-27 ENCOUNTER — Ambulatory Visit
Admission: RE | Admit: 2017-08-27 | Discharge: 2017-08-27 | Disposition: A | Discharge status: Home / Self Care | Payer: BLUE CROSS/BLUE SHIELD | Source: Ambulatory Visit | Attending: Radiation Oncology | Admitting: Radiation Oncology

## 2017-08-27 VITALS — Ht 63.0 in | Wt 156.0 lb

## 2017-08-27 DIAGNOSIS — Z7982 Long term (current) use of aspirin: Secondary | ICD-10-CM | POA: Diagnosis not present

## 2017-08-27 DIAGNOSIS — Z510 Encounter for antineoplastic radiation therapy: Secondary | ICD-10-CM | POA: Diagnosis not present

## 2017-08-27 DIAGNOSIS — I214 Non-ST elevation (NSTEMI) myocardial infarction: Secondary | ICD-10-CM | POA: Diagnosis not present

## 2017-08-27 DIAGNOSIS — K219 Gastro-esophageal reflux disease without esophagitis: Secondary | ICD-10-CM | POA: Diagnosis not present

## 2017-08-27 DIAGNOSIS — Z170 Estrogen receptor positive status [ER+]: Principal | ICD-10-CM

## 2017-08-27 DIAGNOSIS — C50412 Malignant neoplasm of upper-outer quadrant of left female breast: Secondary | ICD-10-CM | POA: Diagnosis not present

## 2017-08-27 DIAGNOSIS — Z79899 Other long term (current) drug therapy: Secondary | ICD-10-CM | POA: Diagnosis not present

## 2017-08-27 DIAGNOSIS — M8580 Other specified disorders of bone density and structure, unspecified site: Secondary | ICD-10-CM | POA: Diagnosis not present

## 2017-08-27 DIAGNOSIS — I10 Essential (primary) hypertension: Secondary | ICD-10-CM | POA: Diagnosis not present

## 2017-08-27 LAB — CBC
HEMATOCRIT: 38.5 % (ref 35.0–47.0)
Hemoglobin: 13 g/dL (ref 12.0–16.0)
MCH: 31.2 pg (ref 26.0–34.0)
MCHC: 33.8 g/dL (ref 32.0–36.0)
MCV: 92.2 fL (ref 80.0–100.0)
Platelets: 229 10*3/uL (ref 150–440)
RBC: 4.17 MIL/uL (ref 3.80–5.20)
RDW: 14.1 % (ref 11.5–14.5)
WBC: 5.2 10*3/uL (ref 3.6–11.0)

## 2017-08-27 NOTE — Progress Notes (Signed)
Daily Session Note  Patient Details  Name: SHANNEL ZAHM MRN: [id number] Date of Birth: 1955/05/09 Referring Provider:     Cardiac Rehab from 07/12/2017 in [facility] Cardiac and Pulmonary Rehab  Referring Provider  Gollan      Encounter Date: 08/27/2017  Check In: Session Check In - 08/27/17 0738      Check-In   Supervising physician immediately available to respond to emergencies  See telemetry face sheet for immediately available ER MD    Location  [facility]-Cardiac & Pulmonary Rehab    Staff Present  Heath Lark, RN, BSN, CCRP;Zygmund Passero [place], MA, RCEP, CCRP, Exercise Physiologist;Kelly Amedeo Plenty, BS, ACSM CEP, Exercise Physiologist    Medication changes reported      No    Fall or balance concerns reported     No    Warm-up and Cool-down  Performed on first and last piece of equipment    Resistance Training Performed  Yes    VAD Patient?  No    PAD/SET Patient?  No      Pain Assessment   Currently in Pain?  No/denies          Social History   Tobacco Use  Smoking Status Never Smoker  Smokeless Tobacco Never Used    Goals Met:  Independence with exercise equipment Exercise tolerated well No report of cardiac concerns or symptoms Strength training completed today  Goals Unmet:  Not Applicable  Comments: Pt able to follow exercise prescription today without complaint.  Will continue to monitor for progression. [place] Name 07/12/17 1520 08/27/17 0919       6 Minute Walk   Phase  -  Discharge    Distance  1400 feet  1600 feet    Distance % Change  -  14 %    Distance Feet Change  -  200 ft    Walk Time  6 minutes  6 minutes    # of Rest Breaks  0  0    MPH  2.65  3.03    METS  3.3  3.79    RPE  13  13    Perceived Dyspnea   1  -    VO2 Peak  11.56  13.28    Symptoms  No  No    Resting HR  64 bpm  68 bpm    Resting BP  106/68  122/70    Resting Oxygen Saturation   95 %  -    Exercise Oxygen Saturation  during 6 min walk  98 %  -    Max Ex. HR  97  bpm  103 bpm    Max Ex. BP  120/68  130/80    2 Minute Post BP  112/72  -         Dr. Emily Filbert is Medical Director for [place] and [facility].

## 2017-08-28 ENCOUNTER — Other Ambulatory Visit: Payer: Self-pay | Admitting: *Deleted

## 2017-08-28 ENCOUNTER — Ambulatory Visit
Admission: RE | Admit: 2017-08-28 | Discharge: 2017-08-28 | Disposition: A | Discharge status: Home / Self Care | Payer: BLUE CROSS/BLUE SHIELD | Source: Ambulatory Visit | Attending: Radiation Oncology | Admitting: Radiation Oncology

## 2017-08-28 DIAGNOSIS — Z170 Estrogen receptor positive status [ER+]: Principal | ICD-10-CM

## 2017-08-28 DIAGNOSIS — C50412 Malignant neoplasm of upper-outer quadrant of left female breast: Secondary | ICD-10-CM

## 2017-08-28 DIAGNOSIS — Z510 Encounter for antineoplastic radiation therapy: Secondary | ICD-10-CM | POA: Diagnosis not present

## 2017-08-28 NOTE — Progress Notes (Signed)
Cardiology Office Note  Date:  08/29/2017   ID:  Maria Barnes, DOB Nov 23, 1955, MRN [id number]  PCP:  Maryland Pink, MD   Chief Complaint  Patient presents with  . OTHER    2 month f/u no complaints today. Meds reviewed verbally with pt.    HPI:  62 year old woman with  Past medical history of SVT  hypertension  lumpectomy for breast cancer,  substernal chest pain, diaphoresis sweating , troponins up to 1.5 CTA chest was performed and was neg for PE.  Cardiac cath with suspected coronary dissection of the mid to distal LAD with severe stenosis/tapering mid LAD to apical region Severe apical and periapical akinesis. Mildly depressed EF, 45 to 50% Who presents for follow-up of her coronary artery disease recent dissection  She has been participating in cardiac rehabilitation Feels well with no significant shortness of breath on exertion Would like to participate in the "CARE" program through oncology after she is done with cardiac rehabilitation  Denies any significant chest discomfort on exertion Denies having any significant side effects from her medications  Records from the hospital reviewed with her in detail LHC 06/22/17:   Mid LAD to Dist LAD lesion is 80% stenosed.  The left ventricular ejection fraction is 45-50% by visual estimate.  LV end diastolic pressure is normal.  There is mild left ventricular systolic dysfunction.  There is no mitral valve regurgitation.   Echo 06/22/17: Study Conclusions  - Left ventricle: The cavity size was normal. Systolic function wasnormal. The estimated ejection fraction was in the range of 50% to 55%. Select images with at least moderate hypokinesis in theapical, periapical and apical septal region Doppler parameters are consistent with abnormal left ventricular relaxation (grade 1 diastolic dysfunction). - Left atrium: The atrium was normal in size. - Right ventricle: Systolic function was normal. - Pulmonary arteries:  Systolic pressure was within the normal range.  EKG personally reviewed by myself on todays visit Shows normal sinus rhythm rate 64 bpm no significant ST or T-wave changes   PMH:   has a past medical history of Atypical chest pain, Breast cancer of upper-outer quadrant of left female breast ([place]) (05/30/2017), Colon polyps, Complication of anesthesia, Concussion (0865), Diastolic dysfunction, Family history of adverse reaction to anesthesia, GERD (gastroesophageal reflux disease), History of hiatal hernia, Hypertension, Migraine, Osteopenia, PONV (postoperative nausea and vomiting), Shingles, Spontaneous dissection of coronary artery (06/21/2017), SVT (supraventricular tachycardia) ([place]), VAIN I (vaginal intraepithelial neoplasia grade I), and Varicose veins.  PSH:    Past Surgical History:  Procedure Laterality Date  . BREAST BIOPSY Left 05/01/2017    x 2 areas.  2:00 6cmfn irregular mass wing clip.  2:00 6cmfn round mass venus clip. DUCTAL CARCINOMA IN SITU and INVASIVE MAMMARY CARCINOMA  . BREAST BIOPSY Left 05/10/2017   affirm bx with  X marker  PSEUDO-ANGIOMATOUS STROMAL HYPERPLASIA /Upper outer Quad  . BREAST LUMPECTOMY Left 05/30/2017   lumpectomy of wing and venus markers, invasive mammary carcinoma and DCIS  . BREAST LUMPECTOMY WITH SENTINEL LYMPH NODE BIOPSY Left 05/30/2017   Procedure: BREAST LUMPECTOMY WITH SENTINEL LYMPH NODE BX,NEEDLE LOCALIZATION;  Surgeon: Robert Bellow, MD;  Location: [facility] OR[facility];  Service: General;  Laterality: Left;  . cervical cryosurgery  1980  . COLONOSCOPY  2011  . DILATION AND CURETTAGE OF UTERUS     x 2  . LEFT HEART CATH AND CORONARY ANGIOGRAPHY N/A 06/22/2017   Procedure: LEFT HEART CATH AND CORONARY ANGIOGRAPHY;  Surgeon: Minna Merritts, MD;  Location:  [facility];  Service: Cardiovascular;  Laterality: N/A;  . TUBAL LIGATION    . VAGINAL HYSTERECTOMY     tvh    Current Outpatient Medications  Medication Sig Dispense Refill  .  acetaminophen (TYLENOL) 500 MG tablet Take 1,000 mg by mouth every 6 (six) hours as needed (for pain/headaches.).    Marland Kitchen aspirin 81 MG EC tablet Take 1 tablet (81 mg total) by mouth daily. 30 tablet 0  . atorvastatin (LIPITOR) 80 MG tablet Take 1 tablet (80 mg total) by mouth daily at 6 PM. 30 tablet 0  . calcium-vitamin D (OSCAL WITH D) 500-200 MG-UNIT tablet Take 1 tablet by mouth.    . metoprolol tartrate (LOPRESSOR) 100 MG tablet Take 1 tablet (100 mg total) by mouth 2 (two) times daily. 180 tablet 2  . Multiple Vitamin (MULTIVITAMIN WITH MINERALS) TABS tablet Take 1 tablet by mouth daily.    . nitroGLYCERIN (NITROSTAT) 0.4 MG SL tablet Place 1 tablet (0.4 mg total) under the tongue every 5 (five) minutes as needed for chest pain. 25 tablet 3  . pantoprazole (PROTONIX) 40 MG tablet Take 40 mg by mouth daily.  5  . vitamin C (ASCORBIC ACID) 500 MG tablet Take 500 mg by mouth daily.     No current facility-administered medications for this visit.      Allergies:   Patient has no known allergies.   Social History:  The patient  reports that she has never smoked. She has never used smokeless tobacco. She reports that she does not drink alcohol or use drugs.   Family History:   family history includes Anxiety disorder in her brother; Breast cancer (age of onset: 78) in her paternal aunt; Breast cancer (age of onset: 32) in her mother; Cancer in her paternal aunt; Diabetes in her father; Esophageal cancer (age of onset: 109) in her father; Hypertension in her brother, brother, and father; Stomach cancer in her paternal grandfather; Thyroid disease in her father.    Review of Systems: Review of Systems  Constitutional: Negative.   Respiratory: Negative.   Cardiovascular: Negative.   Gastrointestinal: Negative.   Musculoskeletal: Negative.   Neurological: Negative.   Psychiatric/Behavioral: Negative.   All other systems reviewed and are negative.   PHYSICAL EXAM: VS:  BP 108/74 (BP  Location: Left Arm, Patient Position: Sitting, Cuff Size: Normal)   Pulse 64   Ht 5\' 3"  (1.6 m)   Wt 154 lb 8 oz (70.1 kg)   BMI 27.37 kg/m  , BMI Body mass index is 27.37 kg/m. GEN: Well nourished, well developed, in no acute distress  HEENT: normal  Neck: no JVD, carotid bruits, or masses Cardiac: RRR; no murmurs, rubs, or gallops,no edema  Respiratory:  clear to auscultation bilaterally, normal work of breathing GI: soft, nontender, nondistended, + BS MS: no deformity or atrophy  Skin: warm and dry, no rash Neuro:  Strength and sensation are intact Psych: euthymic mood, full affect   Recent Labs: 06/22/2017: BUN 12; Creatinine, Ser 0.71; Potassium 3.9; Sodium 141; TSH 1.270 08/21/2017: ALT 38 08/27/2017: Hemoglobin 13.0; Platelets 229    Lipid Panel Lab Results  Component Value Date   CHOL 125 08/21/2017   HDL 40 (L) 08/21/2017   LDLCALC 53 08/21/2017   TRIG 162 (H) 08/21/2017      Wt Readings from Last 3 Encounters:  08/29/17 154 lb 8 oz (70.1 kg)  08/27/17 156 lb (70.8 kg)  07/12/17 157 lb (71.2 kg)  ASSESSMENT AND PLAN:  NSTEMI (non-ST elevated myocardial infarction) ([place]) - Plan: EKG 12-Lead Secondary to spontaneous coronary dissection She has made excellent recovery We did review previous echocardiogram showing only minimally depressed ejection fraction and region of hypokinesis in the mid to distal LAD territory Was discharged from the hospital on aspirin and Plavix. Plavix discontinued Jun 29 2017 on office visit after discussion with Dr. Fletcher Anon She is taking aspirin today  Essential (primary) hypertension - Plan: EKG 12-Lead Blood pressure is well controlled on today's visit. No changes made to the medications.  Supraventricular tachycardia (HCC) No recent episodes of SVT We did discuss dosing of metoprolol So far is tolerating metoprolol tartrate 100 twice a day Previously was on 50 twice a day but dose was increased while she was in the  hospital If she finds herself having significant fatigue, we could decrease the dose back to 50 twice a day  Breast cancer Currently receiving radiation She has indicated she may  require hormonal therapy following radiation  Coronary artery dissection Details as above  Disposition:   F/U  12 months   Total encounter time more than 25 minutes  Greater than 50% was spent in counseling and coordination of care with the patient    Orders Placed This Encounter  Procedures  . EKG 12-Lead     Signed, Esmond Plants, M.D., Ph.D. 08/29/2017  [place], [place]

## 2017-08-29 ENCOUNTER — Encounter: Payer: Self-pay | Admitting: Cardiovascular Disease

## 2017-08-29 ENCOUNTER — Ambulatory Visit
Admission: RE | Admit: 2017-08-29 | Discharge: 2017-08-29 | Disposition: A | Discharge status: Home / Self Care | Payer: BLUE CROSS/BLUE SHIELD | Source: Ambulatory Visit | Attending: Radiation Oncology | Admitting: Radiation Oncology

## 2017-08-29 ENCOUNTER — Ambulatory Visit (INDEPENDENT_AMBULATORY_CARE_PROVIDER_SITE_OTHER): Payer: BLUE CROSS/BLUE SHIELD | Admitting: Cardiovascular Disease

## 2017-08-29 VITALS — BP 108/74 | HR 64 | Ht 63.0 in | Wt 154.5 lb

## 2017-08-29 DIAGNOSIS — M8580 Other specified disorders of bone density and structure, unspecified site: Secondary | ICD-10-CM | POA: Diagnosis not present

## 2017-08-29 DIAGNOSIS — C50412 Malignant neoplasm of upper-outer quadrant of left female breast: Secondary | ICD-10-CM | POA: Diagnosis not present

## 2017-08-29 DIAGNOSIS — Z170 Estrogen receptor positive status [ER+]: Secondary | ICD-10-CM | POA: Diagnosis not present

## 2017-08-29 DIAGNOSIS — Z510 Encounter for antineoplastic radiation therapy: Secondary | ICD-10-CM | POA: Diagnosis not present

## 2017-08-29 DIAGNOSIS — I214 Non-ST elevation (NSTEMI) myocardial infarction: Secondary | ICD-10-CM

## 2017-08-29 DIAGNOSIS — Z79899 Other long term (current) drug therapy: Secondary | ICD-10-CM | POA: Diagnosis not present

## 2017-08-29 DIAGNOSIS — K219 Gastro-esophageal reflux disease without esophagitis: Secondary | ICD-10-CM | POA: Diagnosis not present

## 2017-08-29 DIAGNOSIS — I471 Supraventricular tachycardia: Secondary | ICD-10-CM | POA: Diagnosis not present

## 2017-08-29 DIAGNOSIS — I2542 Coronary artery dissection: Secondary | ICD-10-CM

## 2017-08-29 DIAGNOSIS — I10 Essential (primary) hypertension: Secondary | ICD-10-CM

## 2017-08-29 DIAGNOSIS — Z7982 Long term (current) use of aspirin: Secondary | ICD-10-CM | POA: Diagnosis not present

## 2017-08-29 NOTE — Patient Instructions (Signed)

## 2017-08-29 NOTE — Progress Notes (Signed)
Daily Session Note  Patient Details  Name: Maria Barnes MRN: [id number] Date of Birth: January 10, 1956 Referring Provider:     Cardiac Rehab from 07/12/2017 in [facility] Cardiac and Pulmonary Rehab  Referring Provider  Goll[place]      Encounter Date: 08/29/2017  Check In: Session Check In - 08/29/17 0745      Check-In   Supervising physician immediately available to respond to emergencies  See telemetry face sheet for immediately available ER MD    Location  [facility]-Cardiac & Pulmonary Rehab    Staff Present  Justin Mend RCP,RRT,BSRT;Jessica Luan Pulling, MA, RCEP, CCRP, Exercise Physiologist;Susanne Bice, RN, BSN, CCRP    Medication changes reported      No    Fall or balance concerns reported     No    Warm-up and Cool-down  Performed on first and last piece of equipment    Resistance Training Performed  Yes    VAD Patient?  No      Pain Assessment   Currently in Pain?  No/denies          Social History   Tobacco Use  Smoking Status Never Smoker  Smokeless Tobacco Never Used    Goals Met:  Independence with exercise equipment Exercise tolerated well No report of cardiac concerns or symptoms Strength training completed today  Goals Unmet:  Not Applicable  Comments: Pt able to follow exercise prescription today without complaint.  Will continue to monitor for progression.   Dr. Emily Filbert is Medical Director for [place] and [facility].

## 2017-08-30 ENCOUNTER — Ambulatory Visit
Admission: RE | Admit: 2017-08-30 | Discharge: 2017-08-30 | Disposition: A | Discharge status: Home / Self Care | Payer: BLUE CROSS/BLUE SHIELD | Source: Ambulatory Visit | Attending: Radiation Oncology | Admitting: Radiation Oncology

## 2017-08-30 DIAGNOSIS — C50412 Malignant neoplasm of upper-outer quadrant of left female breast: Secondary | ICD-10-CM | POA: Diagnosis not present

## 2017-08-30 DIAGNOSIS — Z510 Encounter for antineoplastic radiation therapy: Secondary | ICD-10-CM | POA: Insufficient documentation

## 2017-08-30 DIAGNOSIS — Z170 Estrogen receptor positive status [ER+]: Secondary | ICD-10-CM | POA: Diagnosis not present

## 2017-08-31 ENCOUNTER — Encounter: Payer: BLUE CROSS/BLUE SHIELD | Attending: Cardiovascular Disease | Admitting: *Deleted

## 2017-08-31 ENCOUNTER — Ambulatory Visit
Admission: RE | Admit: 2017-08-31 | Discharge: 2017-08-31 | Disposition: A | Discharge status: Home / Self Care | Payer: BLUE CROSS/BLUE SHIELD | Source: Ambulatory Visit | Attending: Radiation Oncology | Admitting: Radiation Oncology

## 2017-08-31 DIAGNOSIS — Z170 Estrogen receptor positive status [ER+]: Secondary | ICD-10-CM | POA: Diagnosis not present

## 2017-08-31 DIAGNOSIS — Z7982 Long term (current) use of aspirin: Secondary | ICD-10-CM | POA: Diagnosis not present

## 2017-08-31 DIAGNOSIS — I214 Non-ST elevation (NSTEMI) myocardial infarction: Secondary | ICD-10-CM

## 2017-08-31 DIAGNOSIS — K219 Gastro-esophageal reflux disease without esophagitis: Secondary | ICD-10-CM | POA: Insufficient documentation

## 2017-08-31 DIAGNOSIS — M8580 Other specified disorders of bone density and structure, unspecified site: Secondary | ICD-10-CM | POA: Diagnosis not present

## 2017-08-31 DIAGNOSIS — Z79899 Other long term (current) drug therapy: Secondary | ICD-10-CM | POA: Diagnosis not present

## 2017-08-31 DIAGNOSIS — I10 Essential (primary) hypertension: Secondary | ICD-10-CM | POA: Insufficient documentation

## 2017-08-31 DIAGNOSIS — C50412 Malignant neoplasm of upper-outer quadrant of left female breast: Secondary | ICD-10-CM | POA: Insufficient documentation

## 2017-08-31 DIAGNOSIS — Z510 Encounter for antineoplastic radiation therapy: Secondary | ICD-10-CM | POA: Diagnosis not present

## 2017-08-31 NOTE — Progress Notes (Signed)
Daily Session Note  Patient Details  Name: Maria Barnes MRN: [id number] Date of Birth: 05-23-1955 Referring Provider:     Cardiac Rehab from 07/12/2017 in [facility] Cardiac and Pulmonary Rehab  Referring Provider  Gollan      Encounter Date: 08/31/2017  Check In: Session Check In - 08/31/17 0807      Check-In   Supervising physician immediately available to respond to emergencies  See telemetry face sheet for immediately available ER MD    Location  [facility]-Cardiac & Pulmonary Rehab    Staff Present  Alberteen Sam, MA, RCEP, CCRP, Exercise Physiologist;Amanda Oletta Darter, BA, ACSM CEP, Exercise Physiologist;Meredith Sherryll Burger, RN BSN    Medication changes reported      No    Fall or balance concerns reported     No    Warm-up and Cool-down  Performed on first and last piece of equipment    Resistance Training Performed  Yes    VAD Patient?  No    PAD/SET Patient?  No      Pain Assessment   Currently in Pain?  No/denies          Social History   Tobacco Use  Smoking Status Never Smoker  Smokeless Tobacco Never Used    Goals Met:  Independence with exercise equipment Exercise tolerated well No report of cardiac concerns or symptoms Strength training completed today  Goals Unmet:  Not Applicable  Comments: Pt able to follow exercise prescription today without complaint.  Will continue to monitor for progression.    Dr. Emily Filbert is Medical Director for [place] and [facility].

## 2017-09-03 ENCOUNTER — Ambulatory Visit
Admission: RE | Admit: 2017-09-03 | Discharge: 2017-09-03 | Disposition: A | Discharge status: Home / Self Care | Payer: BLUE CROSS/BLUE SHIELD | Source: Ambulatory Visit | Attending: Radiation Oncology | Admitting: Radiation Oncology

## 2017-09-03 ENCOUNTER — Encounter: Payer: BLUE CROSS/BLUE SHIELD | Admitting: *Deleted

## 2017-09-03 DIAGNOSIS — Z170 Estrogen receptor positive status [ER+]: Secondary | ICD-10-CM | POA: Diagnosis not present

## 2017-09-03 DIAGNOSIS — Z79899 Other long term (current) drug therapy: Secondary | ICD-10-CM | POA: Diagnosis not present

## 2017-09-03 DIAGNOSIS — M8580 Other specified disorders of bone density and structure, unspecified site: Secondary | ICD-10-CM | POA: Diagnosis not present

## 2017-09-03 DIAGNOSIS — Z7982 Long term (current) use of aspirin: Secondary | ICD-10-CM | POA: Diagnosis not present

## 2017-09-03 DIAGNOSIS — K219 Gastro-esophageal reflux disease without esophagitis: Secondary | ICD-10-CM | POA: Diagnosis not present

## 2017-09-03 DIAGNOSIS — I214 Non-ST elevation (NSTEMI) myocardial infarction: Secondary | ICD-10-CM | POA: Diagnosis not present

## 2017-09-03 DIAGNOSIS — I10 Essential (primary) hypertension: Secondary | ICD-10-CM | POA: Diagnosis not present

## 2017-09-03 DIAGNOSIS — Z510 Encounter for antineoplastic radiation therapy: Secondary | ICD-10-CM | POA: Diagnosis not present

## 2017-09-03 DIAGNOSIS — C50412 Malignant neoplasm of upper-outer quadrant of left female breast: Secondary | ICD-10-CM | POA: Diagnosis not present

## 2017-09-03 NOTE — Progress Notes (Signed)
Daily Session Note  Patient Details  Name: Maria Barnes MRN: [id number] Date of Birth: November 14, 1955 Referring Provider:     Cardiac Rehab from 07/12/2017 in [facility] Cardiac and Pulmonary Rehab  Referring Provider  Gollan      Encounter Date: 09/03/2017  Check In: Session Check In - 09/03/17 0754      Check-In   Supervising physician immediately available to respond to emergencies  See telemetry face sheet for immediately available ER MD    Location  [facility]-Cardiac & Pulmonary Rehab    Staff Present  Heath Lark, RN, BSN, Laveda Norman, BS, ACSM CEP, Exercise Physiologist;Jessica [place], [place], RCEP, CCRP, Exercise Physiologist    Medication changes reported      No    Fall or balance concerns reported     No    Tobacco Cessation  No Change    Warm-up and Cool-down  Performed on first and last piece of equipment    Resistance Training Performed  Yes    VAD Patient?  No    PAD/SET Patient?  No      Pain Assessment   Currently in Pain?  No/denies    Multiple Pain Sites  No          Social History   Tobacco Use  Smoking Status Never Smoker  Smokeless Tobacco Never Used    Goals Met:  Independence with exercise equipment Exercise tolerated well No report of cardiac concerns or symptoms Strength training completed today  Goals Unmet:  Not Applicable  Comments: Pt able to follow exercise prescription today without complaint.  Will continue to monitor for progression.    Dr. Emily Filbert is Medical Director for [place] and [facility].

## 2017-09-03 NOTE — Patient Instructions (Signed)
Discharge Patient Instructions  Patient Details  Name: Maria Barnes MRN: [id number] Date of Birth: [birth year] Referring Provider:  [place] Pink, MD   Number of Visits: 36  Reason for Discharge:  Patient reached a stable level of exercise. Patient independent in their exercise. Patient has met program and personal goals.  Smoking History:  Social History   Tobacco Use  Smoking Status Never Smoker  Smokeless Tobacco Never Used    Diagnosis:  NSTEMI (non-ST elevated myocardial infarction) [facility])  Initial Exercise Prescription: Initial Exercise Prescription - 07/12/17 1500      Date of Initial Exercise RX and Referring Provider   Date  07/12/17    Referring Provider  Gollan      Treadmill   MPH  2.6    Grade  1    Minutes  15    METs  3.3      Recumbant Bike   Level  3    RPM  60    Watts  30    Minutes  15    METs  3.3      NuStep   Level  3    SPM  80    Minutes  15    METs  3.3      Prescription Details   Frequency (times per week)  3    Duration  Progress to 45 minutes of aerobic exercise without signs/symptoms of physical distress      Intensity   THRR 40-80% of Max Heartrate  101-139    Ratings of Perceived Exertion  11-15    Perceived Dyspnea  0-4      Resistance Training   Training Prescription  Yes    Weight  3 lb    Reps  10-15       Discharge Exercise Prescription (Final Exercise Prescription Changes): Exercise Prescription Changes - 08/20/17 1600      Response to Exercise   Blood Pressure (Admit)  124/62    Blood Pressure (Exercise)  110/60    Blood Pressure (Exit)  138/80    Heart Rate (Admit)  82 bpm    Heart Rate (Exercise)  105 bpm    Heart Rate (Exit)  77 bpm    Rating of Perceived Exertion (Exercise)  13    Symptoms  none    Duration  Continue with 45 min of aerobic exercise without signs/symptoms of physical distress.    Intensity  THRR unchanged      Progression   Progression  Continue to progress workloads to  maintain intensity without signs/symptoms of physical distress.    Average METs  3.98      Resistance Training   Training Prescription  Yes    Weight  5 lbs    Reps  10-15      Interval Training   Interval Training  No      Treadmill   MPH  3    Grade  2    Minutes  15    METs  4.12      Recumbant Bike   Level  5    Watts  44    Minutes  15    METs  4.02      NuStep   Level  5    Minutes  15    METs  3.8      Home Exercise Plan   Plans to continue exercise at  Home (comment) walking, stationary bike    Frequency  Add 2 additional days  to program exercise sessions.    Initial Home Exercises Provided  08/01/17       Functional Capacity: 6 Minute Walk    Row Name 07/12/17 1520 08/27/17 0919       6 Minute Walk   Phase  -  Discharge    Distance  1400 feet  1600 feet    Distance % Change  -  14 %    Distance Feet Change  -  200 ft    Walk Time  6 minutes  6 minutes    # of Rest Breaks  0  0    MPH  2.65  3.03    METS  3.3  3.79    RPE  13  13    Perceived Dyspnea   1  -    VO2 Peak  11.56  13.28    Symptoms  No  No    Resting HR  64 bpm  68 bpm    Resting BP  106/68  122/70    Resting Oxygen Saturation   95 %  -    Exercise Oxygen Saturation  during 6 min walk  98 %  -    Max Ex. HR  97 bpm  103 bpm    Max Ex. BP  120/68  130/80    2 Minute Post BP  112/72  -       Quality of Life: Quality of Life - 08/29/17 0809      Quality of Life   Select  Quality of Life      Quality of Life Scores   Health/Function Pre  21.75 %    Health/Function Post  29.54 %    Health/Function % Change  35.82 %    Socioeconomic Pre  25.29 %    Socioeconomic Post  28.07 %    Socioeconomic % Change   10.99 %    Psych/Spiritual Pre  23.71 %    Psych/Spiritual Post  27.21 %    Psych/Spiritual % Change  14.76 %    Family Pre  30 %    Family Post  28.8 %    Family % Change  -4 %    GLOBAL Pre  24.17 %    GLOBAL Post  28.59 %    GLOBAL % Change  18.29 %        Personal Goals: Goals established at orientation with interventions provided to work toward goal. Personal Goals and Risk Factors at Admission - 07/12/17 1450      Core Components/Risk Factors/Patient Goals on Admission    Weight Management  Yes;Obesity;Weight Loss    Intervention  Weight Management: Develop a combined nutrition and exercise program designed to reach desired caloric intake, while maintaining appropriate intake of nutrient and fiber, sodium and fats, and appropriate energy expenditure required for the weight goal.    Admit Weight  157 lb 11.2 oz (71.5 kg)    Goal Weight: Short Term  155 lb (70.3 kg)    Goal Weight: Long Term  140 lb (63.5 kg)    Expected Outcomes  Short Term: Continue to assess and modify interventions until short term weight is achieved;Long Term: Adherence to nutrition and physical activity/exercise program aimed toward attainment of established weight goal;Weight Loss: Understanding of general recommendations for a balanced deficit meal plan, which promotes 1-2 lb weight loss per week and includes a negative energy balance of [phone] kcal/d    Hypertension  Yes    Intervention  Provide education  on lifestyle modifcations including regular physical activity/exercise, weight management, moderate sodium restriction and increased consumption of fresh fruit, vegetables, and low fat dairy, alcohol moderation, and smoking cessation.;Monitor prescription use compliance.    Expected Outcomes  Short Term: Continued assessment and intervention until BP is < 140/46m HG in hypertensive participants. < 130/853mHG in hypertensive participants with diabetes, heart failure or chronic kidney disease.;Long Term: Maintenance of blood pressure at goal levels.    Lipids  Yes    Intervention  Provide education and support for participant on nutrition & aerobic/resistive exercise along with prescribed medications to achieve LDL <7053mHDL >40m1m  Expected Outcomes  Short Term:  Participant states understanding of desired cholesterol values and is compliant with medications prescribed. Participant is following exercise prescription and nutrition guidelines.;Long Term: Cholesterol controlled with medications as prescribed, with individualized exercise RX and with personalized nutrition plan. Value goals: LDL < 70mg6mL > 40 mg.    Stress  Yes    Intervention  Offer individual and/or small group education and counseling on adjustment to heart disease, stress management and health-related lifestyle change. Teach and support self-help strategies.;Refer participants experiencing significant psychosocial distress to appropriate mental health specialists for further evaluation and treatment. When possible, include family members and significant others in education/counseling sessions.    Expected Outcomes  Short Term: Participant demonstrates changes in health-related behavior, relaxation and other stress management skills, ability to obtain effective social support, and compliance with psychotropic medications if prescribed.;Long Term: Emotional wellbeing is indicated by absence of clinically significant psychosocial distress or social isolation.        Personal Goals Discharge: Goals and Risk Factor Review - 08/22/17 0803      Core Components/Risk Factors/Patient Goals Review   Personal Goals Review  Weight Management/Obesity;Hypertension;Lipids    Review  AnitaJazmarieinues to hold steady on her weight, varies about 1-2 lbs a day.  She is still good with pressures as checks them at home.  She had labs done yesterday with good results.  Her lipid panel was much improved with HDL at 40and LDL at 53!!  She was quite pleased with herself.      Expected Outcomes  Short: Continue to work on weight loss.  Long: Continue to monitor risk factors.        Exercise Goals and Review: Exercise Goals    Row Name 07/12/17 1519             Exercise Goals   Increase Physical Activity  Yes        Intervention  Provide advice, education, support and counseling about physical activity/exercise needs.;Develop an individualized exercise prescription for aerobic and resistive training based on initial evaluation findings, risk stratification, comorbidities and participant's personal goals.       Expected Outcomes  Short Term: Attend rehab on a regular basis to increase amount of physical activity.;Long Term: Add in home exercise to make exercise part of routine and to increase amount of physical activity.;Long Term: Exercising regularly at least 3-5 days a week.       Increase Strength and Stamina  Yes       Intervention  Provide advice, education, support and counseling about physical activity/exercise needs.;Develop an individualized exercise prescription for aerobic and resistive training based on initial evaluation findings, risk stratification, comorbidities and participant's personal goals.       Expected Outcomes  Short Term: Increase workloads from initial exercise prescription for resistance, speed, and METs.;Short Term: Perform resistance training exercises routinely during  rehab and add in resistance training at home;Long Term: Improve cardiorespiratory fitness, muscular endurance and strength as measured by increased METs and functional capacity (6MWT)       Able to understand and use rate of perceived exertion (RPE) scale  Yes       Intervention  Provide education and explanation on how to use RPE scale       Expected Outcomes  Short Term: Able to use RPE daily in rehab to express subjective intensity level;Long Term:  Able to use RPE to guide intensity level when exercising independently       Able to understand and use Dyspnea scale  Yes       Intervention  Provide education and explanation on how to use Dyspnea scale       Expected Outcomes  Short Term: Able to use Dyspnea scale daily in rehab to express subjective sense of shortness of breath during exertion;Long Term: Able to  use Dyspnea scale to guide intensity level when exercising independently       Knowledge and understanding of Target Heart Rate Range (THRR)  Yes       Intervention  Provide education and explanation of THRR including how the numbers were predicted and where they are located for reference       Expected Outcomes  Short Term: Able to state/look up THRR;Short Term: Able to use daily as guideline for intensity in rehab;Long Term: Able to use THRR to govern intensity when exercising independently       Able to check pulse independently  Yes       Intervention  Provide education and demonstration on how to check pulse in carotid and radial arteries.;Review the importance of being able to check your own pulse for safety during independent exercise       Expected Outcomes  Short Term: Able to explain why pulse checking is important during independent exercise;Long Term: Able to check pulse independently and accurately       Understanding of Exercise Prescription  Yes       Intervention  Provide education, explanation, and written materials on patient's individual exercise prescription       Expected Outcomes  Short Term: Able to explain program exercise prescription;Long Term: Able to explain home exercise prescription to exercise independently          Nutrition & Weight - Outcomes: Pre Biometrics - 07/12/17 1518      Pre Biometrics   Height  _0  (1.6 m)    Weight  157 lb 11.2 oz (71.5 kg)    Waist Circumference  37.5 inches    Hip Circumference  42 inches    Waist to Hip Ratio  0.89 %    BMI (Calculated)  27.94    Single Leg Stand  30 seconds      Post Biometrics - 08/27/17 0920       Post  Biometrics   Height  _1  (1.6 m)    Weight  156 lb (70.8 kg)    Waist Circumference  35.5 inches    Hip Circumference  40.75 inches    Waist to Hip Ratio  0.87 %    BMI (Calculated)  27.64    Single Leg Stand  26.67 seconds       Nutrition: Nutrition Therapy & Goals - 07/30/17 1106       Nutrition Therapy   Diet  Instructed patients on heart healthy dietary guidelines based on 1400 calorie meal plan.  Drug/Food Interactions  Statins/Certain Fruits    Protein (specify units)  6    Fiber  25 grams    Whole Grain Foods  3 servings    Saturated Fats  11 max. grams    Fruits and Vegetables  5 servings/day    Sodium  1500 grams      Personal Nutrition Goals   Nutrition Goal  Balance meals with 2-4 oz protein, 2-3 servings of carbohydrate and non-starchy vegetables.    Personal Goal #2  Read labels for saturated fat and sodium.    Personal Goal #3  Continue to increase fruits and vegetables with a minimum of 5 servings daily with 8 servings as optimum intake.      Intervention Plan   Intervention  Prescribe, educate and counsel regarding individualized specific dietary modifications aiming towards targeted core components such as weight, hypertension, lipid management, diabetes, heart failure and other comorbidities.;Nutrition handout(s) given to patient.    Expected Outcomes  Short Term Goal: Understand basic principles of dietary content, such as calories, fat, sodium, cholesterol and nutrients.;Short Term Goal: A plan has been developed with personal nutrition goals set during dietitian appointment.;Long Term Goal: Adherence to prescribed nutrition plan.       Nutrition Discharge: Nutrition Assessments - 08/29/17 0806      MEDFICTS Scores   Pre Score  12    Post Score  3    Score Difference  -9       Education Questionnaire Score: Knowledge Questionnaire Score - 08/29/17 0805      Knowledge Questionnaire Score   Pre Score  23/26    Post Score  24/26 reviewed with patient       Goals reviewed with patient; copy given to patient.

## 2017-09-04 ENCOUNTER — Ambulatory Visit
Admission: RE | Admit: 2017-09-04 | Discharge: 2017-09-04 | Disposition: A | Discharge status: Home / Self Care | Payer: BLUE CROSS/BLUE SHIELD | Source: Ambulatory Visit | Attending: Radiation Oncology | Admitting: Radiation Oncology

## 2017-09-04 DIAGNOSIS — Z510 Encounter for antineoplastic radiation therapy: Secondary | ICD-10-CM | POA: Diagnosis not present

## 2017-09-04 DIAGNOSIS — Z170 Estrogen receptor positive status [ER+]: Secondary | ICD-10-CM | POA: Diagnosis not present

## 2017-09-04 DIAGNOSIS — C50412 Malignant neoplasm of upper-outer quadrant of left female breast: Secondary | ICD-10-CM | POA: Diagnosis not present

## 2017-09-05 ENCOUNTER — Encounter: Payer: BLUE CROSS/BLUE SHIELD | Admitting: *Deleted

## 2017-09-05 ENCOUNTER — Ambulatory Visit
Admission: RE | Admit: 2017-09-05 | Discharge: 2017-09-05 | Disposition: A | Discharge status: Home / Self Care | Payer: BLUE CROSS/BLUE SHIELD | Source: Ambulatory Visit | Attending: Radiation Oncology | Admitting: Radiation Oncology

## 2017-09-05 DIAGNOSIS — I214 Non-ST elevation (NSTEMI) myocardial infarction: Secondary | ICD-10-CM | POA: Diagnosis not present

## 2017-09-05 DIAGNOSIS — M8580 Other specified disorders of bone density and structure, unspecified site: Secondary | ICD-10-CM | POA: Diagnosis not present

## 2017-09-05 DIAGNOSIS — C50412 Malignant neoplasm of upper-outer quadrant of left female breast: Secondary | ICD-10-CM | POA: Diagnosis not present

## 2017-09-05 DIAGNOSIS — Z7982 Long term (current) use of aspirin: Secondary | ICD-10-CM | POA: Diagnosis not present

## 2017-09-05 DIAGNOSIS — Z79899 Other long term (current) drug therapy: Secondary | ICD-10-CM | POA: Diagnosis not present

## 2017-09-05 DIAGNOSIS — Z170 Estrogen receptor positive status [ER+]: Secondary | ICD-10-CM | POA: Diagnosis not present

## 2017-09-05 DIAGNOSIS — K219 Gastro-esophageal reflux disease without esophagitis: Secondary | ICD-10-CM | POA: Diagnosis not present

## 2017-09-05 DIAGNOSIS — Z510 Encounter for antineoplastic radiation therapy: Secondary | ICD-10-CM | POA: Diagnosis not present

## 2017-09-05 DIAGNOSIS — I10 Essential (primary) hypertension: Secondary | ICD-10-CM | POA: Diagnosis not present

## 2017-09-05 NOTE — Progress Notes (Signed)
Daily Session Note  Patient Details  Name: Maria Barnes MRN: [id number] Date of Birth: 02-19-1955 Referring Provider:     Cardiac Rehab from 07/12/2017 in [facility] Cardiac [facility]  Referring Provider  Gollan      Encounter Date: 09/05/2017  Check In: Session Check In - 09/05/17 3010      Check-In   Supervising physician immediately available to respond to emergencies  See telemetry face sheet for immediately available ER MD    Location  [facility]-Cardiac & Pulmonary Rehab    Staff Present  Heath Lark, RN, BSN, CCRP;Rawan Riendeau [place], MA, RCEP, CCRP, Exercise Physiologist;Joseph Tessie Fass RCP,RRT,BSRT    Medication changes reported      No    Fall or balance concerns reported     No    Warm-up and Cool-down  Performed on first and last piece of equipment    Resistance Training Performed  Yes    VAD Patient?  No    PAD/SET Patient?  No      Pain Assessment   Currently in Pain?  No/denies          Social History   Tobacco Use  Smoking Status Never Smoker  Smokeless Tobacco Never Used    Goals Met:  Independence with exercise equipment Exercise tolerated well Personal goals reviewed No report of cardiac concerns or symptoms Strength training completed today  Goals Unmet:  Not Applicable  Comments:  Maria Barnes graduated today from  rehab with 36 sessions completed.  Details of the patient's exercise prescription and what She needs to do in order to continue the prescription and progress were discussed with patient.  Patient was given a copy of prescription and goals.  Patient verbalized understanding.  Maria Barnes plans to continue to exercise by joining our CARE program.   Dr. Emily Filbert is Medical Director for [place] [facility].

## 2017-09-05 NOTE — Progress Notes (Signed)
Discharge Progress Report  Patient Details  Name: Maria Barnes MRN: [id number] Date of Birth: 15-Apr-1955 Referring Provider:     Cardiac Rehab from 07/12/2017 in [facility]  Referring Provider  Gollan       Number of Visits: 10  Reason for Discharge:  Patient reached a stable level of exercise. Patient independent in their exercise. Patient has met program and personal goals.  Smoking History:  Social History   Tobacco Use  Smoking Status Never Smoker  Smokeless Tobacco Never Used    Diagnosis:  NSTEMI (non-ST elevated myocardial infarction) ([place])  ADL [facility]:   Initial Exercise Prescription: Initial Exercise Prescription - 07/12/17 1500      Date of Initial Exercise RX and Referring Provider   Date  07/12/17    Referring Provider  Gollan      Treadmill   MPH  2.6    Grade  1    Minutes  15    METs  3.3      Recumbant Bike   Level  3    RPM  60    Watts  30    Minutes  15    METs  3.3      NuStep   Level  3    SPM  80    Minutes  15    METs  3.3      Prescription Details   Frequency (times per week)  3    Duration  Progress to 45 minutes of aerobic exercise without signs/symptoms of physical distress      Intensity   THRR 40-80% of Max Heartrate  101-139    Ratings of Perceived Exertion  11-15    Perceived Dyspnea  0-4      Resistance Training   Training Prescription  Yes    Weight  3 lb    Reps  10-15       Discharge Exercise Prescription (Final Exercise Prescription Changes): Exercise Prescription Changes - 08/20/17 1600      Response to Exercise   Blood Pressure (Admit)  124/62    Blood Pressure (Exercise)  110/60    Blood Pressure (Exit)  138/80    Heart Rate (Admit)  82 bpm    Heart Rate (Exercise)  105 bpm    Heart Rate (Exit)  77 bpm    Rating of Perceived Exertion (Exercise)  13    Symptoms  none    Duration  Continue with 45 min of aerobic exercise without signs/symptoms of physical distress.     Intensity  THRR unchanged      Progression   Progression  Continue to progress workloads to maintain intensity without signs/symptoms of physical distress.    Average METs  3.98      Resistance Training   Training Prescription  Yes    Weight  5 lbs    Reps  10-15      Interval Training   Interval Training  No      Treadmill   MPH  3    Grade  2    Minutes  15    METs  4.12      Recumbant Bike   Level  5    Watts  44    Minutes  15    METs  4.02      NuStep   Level  5    Minutes  15    METs  3.8      Home Exercise  Plan   Plans to continue exercise at  Home (comment) walking, stationary bike    Frequency  Add 2 additional days to program exercise sessions.    Initial Home Exercises Provided  08/01/17       Functional Capacity: 6 Minute Walk    Row Name 07/12/17 1520 08/27/17 0919       6 Minute Walk   Phase  -  Discharge    Distance  1400 feet  1600 feet    Distance % Change  -  14 %    Distance Feet Change  -  200 ft    Walk Time  6 minutes  6 minutes    # of Rest Breaks  0  0    MPH  2.65  3.03    METS  3.3  3.79    RPE  13  13    Perceived Dyspnea   1  -    VO2 Peak  11.56  13.28    Symptoms  No  No    Resting HR  64 bpm  68 bpm    Resting BP  106/68  122/70    Resting Oxygen Saturation   95 %  -    Exercise Oxygen Saturation  during 6 min walk  98 %  -    Max Ex. HR  97 bpm  103 bpm    Max Ex. BP  120/68  130/80    2 Minute Post BP  112/72  -       Psychological, QOL, Others - Outcomes: PHQ 2/9: Depression screen [facility] 2/9 08/29/2017 07/12/2017  Decreased Interest 0 0  Down, Depressed, Hopeless 0 0  PHQ - 2 Score 0 0  Altered sleeping 1 1  Tired, decreased energy 1 1  Change in appetite 0 0  Feeling bad or failure about yourself  0 0  Trouble concentrating 0 0  Moving slowly or fidgety/restless 0 0  Suicidal thoughts 0 0  PHQ-9 Score 2 2  Difficult doing work/chores Somewhat difficult Somewhat difficult    Quality of Life: Quality of  Life - 08/29/17 0809      Quality of Life   Select  Quality of Life      Quality of Life Scores   Health/Function Pre  21.75 %    Health/Function Post  29.54 %    Health/Function % Change  35.82 %    Socioeconomic Pre  25.29 %    Socioeconomic Post  28.07 %    Socioeconomic % Change   10.99 %    Psych/Spiritual Pre  23.71 %    Psych/Spiritual Post  27.21 %    Psych/Spiritual % Change  14.76 %    Family Pre  30 %    Family Post  28.8 %    Family % Change  -4 %    GLOBAL Pre  24.17 %    GLOBAL Post  28.59 %    GLOBAL % Change  18.29 %       Personal Goals: Goals established at orientation with interventions provided to work toward goal. Personal Goals and Risk Factors at Admission - 07/12/17 1450      Core Components/Risk Factors/Patient Goals on Admission    Weight Management  Yes;Obesity;Weight Loss    Intervention  Weight Management: Develop a combined nutrition and exercise program designed to reach desired caloric intake, while maintaining appropriate intake of nutrient and fiber, sodium and fats, and appropriate energy expenditure required for the  weight goal.    Admit Weight  157 lb 11.2 oz (71.5 kg)    Goal Weight: Short Term  155 lb (70.3 kg)    Goal Weight: Long Term  140 lb (63.5 kg)    Expected Outcomes  Short Term: Continue to assess and modify interventions until short term weight is achieved;Long Term: Adherence to nutrition and physical activity/exercise program aimed toward attainment of established weight goal;Weight Loss: Understanding of general recommendations for a balanced deficit meal plan, which promotes 1-2 lb weight loss per week and includes a negative energy balance of [phone] kcal/d    Hypertension  Yes    Intervention  Provide education on lifestyle modifcations including regular physical activity/exercise, weight management, moderate sodium restriction and increased consumption of fresh fruit, vegetables, and low fat dairy, alcohol moderation, and  smoking cessation.;Monitor prescription use compliance.    Expected Outcomes  Short Term: Continued assessment and intervention until BP is < 140/12m HG in hypertensive participants. < 130/853mHG in hypertensive participants with diabetes, heart failure or chronic kidney disease.;Long Term: Maintenance of blood pressure at goal levels.    Lipids  Yes    Intervention  Provide education and support for participant on nutrition & aerobic/resistive exercise along with prescribed medications to achieve LDL <7056mHDL >25m29m  Expected Outcomes  Short Term: Participant states understanding of desired cholesterol values and is compliant with medications prescribed. Participant is following exercise prescription and nutrition guidelines.;Long Term: Cholesterol controlled with medications as prescribed, with individualized exercise RX and with personalized nutrition plan. Value goals: LDL < 70mg35mL > 40 mg.    Stress  Yes    Intervention  Offer individual and/or small group education and counseling on adjustment to heart disease, stress management and health-related lifestyle change. Teach and support self-help strategies.;Refer participants experiencing significant psychosocial distress to appropriate mental health specialists for further evaluation and treatment. When possible, include family members and significant others in education/counseling sessions.    Expected Outcomes  Short Term: Participant demonstrates changes in health-related behavior, relaxation and other stress management skills, ability to obtain effective social support, and compliance with psychotropic medications if prescribed.;Long Term: Emotional wellbeing is indicated by absence of clinically significant psychosocial distress or social isolation.        Personal Goals Discharge: Goals and Risk Factor Review    Row Name 08/01/17 0802 08/22/17 0803           Core Components/Risk Factors/Patient Goals Review   Personal Goals  Review  Weight Management/Obesity;Hypertension;Lipids  Weight Management/Obesity;Hypertension;Lipids      Review  Kami's weight has been holding steady.  She is more mindful about what she is eating.  Blood pressures have been good.  She checks them 2-3x a week at home and has us chKoreak it in class.   She is doing well on her medications.   AnitaJatoriainues to hold steady on her weight, varies about 1-2 lbs a day.  She is still good with pressures as checks them at home.  She had labs done yesterday with good results.  Her lipid panel was much improved with HDL at 40and LDL at 53!!  She was quite pleased with herself.        Expected Outcomes  Short: Continue to work on weight loss. Long: Continue to monitor pressures.   Short: Continue to work on weight loss.  Long: Continue to monitor risk factors.          Exercise Goals and Review: Exercise Goals  [place] Name 07/12/17 1519             Exercise Goals   Increase Physical Activity  Yes       Intervention  Provide advice, education, support and counseling about physical activity/exercise needs.;Develop an individualized exercise prescription for aerobic and resistive training based on initial evaluation findings, risk stratification, comorbidities and participant's personal goals.       Expected Outcomes  Short Term: Attend rehab on a regular basis to increase amount of physical activity.;Long Term: Add in home exercise to make exercise part of routine and to increase amount of physical activity.;Long Term: Exercising regularly at least 3-5 days a week.       Increase Strength and Stamina  Yes       Intervention  Provide advice, education, support and counseling about physical activity/exercise needs.;Develop an individualized exercise prescription for aerobic and resistive training based on initial evaluation findings, risk stratification, comorbidities and participant's personal goals.       Expected Outcomes  Short Term: Increase workloads from  initial exercise prescription for resistance, speed, and METs.;Short Term: Perform resistance training exercises routinely during rehab and add in resistance training at home;Long Term: Improve cardiorespiratory fitness, muscular endurance and strength as measured by increased METs and functional capacity (6MWT)       Able to understand and use rate of perceived exertion (RPE) scale  Yes       Intervention  Provide education and explanation on how to use RPE scale       Expected Outcomes  Short Term: Able to use RPE daily in rehab to express subjective intensity level;Long Term:  Able to use RPE to guide intensity level when exercising independently       Able to understand and use Dyspnea scale  Yes       Intervention  Provide education and explanation on how to use Dyspnea scale       Expected Outcomes  Short Term: Able to use Dyspnea scale daily in rehab to express subjective sense of shortness of breath during exertion;Long Term: Able to use Dyspnea scale to guide intensity level when exercising independently       Knowledge and understanding of Target Heart Rate Range (THRR)  Yes       Intervention  Provide education and explanation of THRR including how the numbers were predicted and where they are located for reference       Expected Outcomes  Short Term: Able to state/look up THRR;Short Term: Able to use daily as guideline for intensity in rehab;Long Term: Able to use THRR to govern intensity when exercising independently       Able to check pulse independently  Yes       Intervention  Provide education and demonstration on how to check pulse in carotid and radial arteries.;Review the importance of being able to check your own pulse for safety during independent exercise       Expected Outcomes  Short Term: Able to explain why pulse checking is important during independent exercise;Long Term: Able to check pulse independently and accurately       Understanding of Exercise Prescription  Yes        Intervention  Provide education, explanation, and written materials on patient's individual exercise prescription       Expected Outcomes  Short Term: Able to explain program exercise prescription;Long Term: Able to explain home exercise prescription to exercise independently          Nutrition &  Weight - Outcomes: Pre Biometrics - 07/12/17 1518      Pre Biometrics   Height  5' 3"  (1.6 m)    Weight  157 lb 11.2 oz (71.5 kg)    Waist Circumference  37.5 inches    Hip Circumference  42 inches    Waist to Hip Ratio  0.89 %    BMI (Calculated)  27.94    Single Leg Stand  30 seconds      Post Biometrics - 08/27/17 0920       Post  Biometrics   Height  5' 3"  (1.6 m)    Weight  156 lb (70.8 kg)    Waist Circumference  35.5 inches    Hip Circumference  40.75 inches    Waist to Hip Ratio  0.87 %    BMI (Calculated)  27.64    Single Leg Stand  26.67 seconds       Nutrition: Nutrition Therapy & Goals - 07/30/17 1106      Nutrition Therapy   Diet  Instructed patients on heart healthy dietary guidelines based on 1400 calorie meal plan.    Drug/Food Interactions  Statins/Certain Fruits    Protein (specify units)  6    Fiber  25 grams    Whole Grain Foods  3 servings    Saturated Fats  11 max. grams    Fruits and Vegetables  5 servings/day    Sodium  1500 grams      Personal Nutrition Goals   Nutrition Goal  Balance meals with 2-4 oz protein, 2-3 servings of carbohydrate and non-starchy vegetables.    Personal Goal #2  Read labels for saturated fat and sodium.    Personal Goal #3  Continue to increase fruits and vegetables with a minimum of 5 servings daily with 8 servings as optimum intake.      Intervention Plan   Intervention  Prescribe, educate and counsel regarding individualized specific dietary modifications aiming towards targeted core components such as weight, hypertension, lipid management, diabetes, heart failure and other comorbidities.;Nutrition handout(s) given to  patient.    Expected Outcomes  Short Term Goal: Understand basic principles of dietary content, such as calories, fat, sodium, cholesterol and nutrients.;Short Term Goal: A plan has been developed with personal nutrition goals set during dietitian appointment.;Long Term Goal: Adherence to prescribed nutrition plan.       Nutrition Discharge: Nutrition Assessments - 08/29/17 0806      MEDFICTS Scores   Pre Score  12    Post Score  3    Score Difference  -9       Education Questionnaire Score: Knowledge Questionnaire Score - 08/29/17 0805      Knowledge Questionnaire Score   Pre Score  23/26    Post Score  24/26 reviewed with patient       Goals reviewed with patient; copy given to patient.

## 2017-09-05 NOTE — Progress Notes (Signed)
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 met with Ms. Lacinda Axon today Rodena Piety) for initial psychosocial evaluation.  She is a 62 year old with a strong support system of a spouse; (2) adult children living locally and her 77 year old father who is in good health.  Sadeel had a heart attack on 5/23 and was also recently had a lumpectomy for breast cancer.  She is to begin radiation in the near future.  Ronald reports sleeping "okay" and having a good appetite.  She denies a history of depression or anxiety but states some anxiety with all of the health issues lately.  However, Julliana reports her mood is typically positive.  In addition to her health, Sanaia has been out of work for a year and reports some financial concerns.  Her goals for this program are to feel better overall and be more energetic.  Counselor reminded Raedyn that radiation will make her tired but exercise will counter that and she  plans to continue this program at the same time she is undergoing radiation.  Staff will follow with Kyria throughout the course of this program.      Expected Outcomes  Short:  Alenah will continue exercising regularly for her heart and energy during upcoming radiation treatments.   Long:  Drenda will learn positive ways to cope with stress and incorporate exercise as a consistent part of that program.      Continue Psychosocial Services   Follow up required by staff       Psychosocial Re-Evaluation: Psychosocial Re-Evaluation    Haena Name 08/01/17 0804 08/22/17 0809           Psychosocial Re-Evaluation   Current issues with  Current Stress Concerns  Current Stress Concerns      Comments  Haydee is doing well mentally.  Her biggest stressor is her healthy.  She is currently going through her 65 radiation treatments.  She continues to sleep okay, she gets hot at night since being off hormones.  The best part of the program has been [place] making her do the exericse and making sure she gets it done.   She continues to do well mentally.  She is on number 17 or 36 for her radiation treatments.  They are making her tired and starting to burning some.  She keeps telling herself that if her daddy can do it then she can too. We have talked about the CARE program after graduation.  She is still sleeping okay, she is still having the hot flashes.       Expected Outcomes  Short: Continue postive attitude through radiation.  Long: Continue to work on exercise.   Short: Continue to stay positive with her treatments.  Long: Continue to exercise!!      Interventions  -  Stress management education;Encouraged to attend Cardiac Rehabilitation for the exercise      Continue Psychosocial Services   -  Follow up required by staff         Psychosocial Discharge (Final Psychosocial Re-Evaluation): Psychosocial Re-Evaluation - 08/22/17 0809      Psychosocial Re-Evaluation   Current issues with  Current Stress Concerns     Comments  She continues to do well mentally.  She is on number 17 or 36 for her radiation treatments.  They are making her tired and starting to burning some.  She keeps telling herself that if her daddy can do it then she can too. We have talked about the CARE program after graduation.  She is still sleeping okay, she is still having the hot flashes.     Expected Outcomes  Short: Continue to stay positive with her treatments.  Long: Continue to exercise!!    Interventions  Stress management education;Encouraged to attend Cardiac Rehabilitation for the exercise    Continue Psychosocial Services   Follow up required by staff       Vocational Rehabilitation: Provide vocational rehab assistance to qualifying candidates.   Vocational Rehab Evaluation & Intervention: Vocational Rehab - 07/12/17 1156      Initial Vocational Rehab Evaluation & Intervention   Assessment shows need for Vocational Rehabilitation  No       Education: Education Goals: Education classes will be provided on a variety of topics geared toward better understanding of heart health and risk factor modification. Participant will state understanding/return demonstration of topics presented as noted by education test scores.  Learning Barriers/Preferences: Learning Barriers/Preferences - 07/12/17 1156      Learning Barriers/Preferences   Learning Barriers  None    Learning Preferences  None       Education Topics:  AED/CPR: - Group verbal and written instruction with the use of models to demonstrate the basic use of the AED with the basic ABC's of resuscitation.   Cardiac Rehab from 09/05/2017 in [facility] Cardiac and Pulmonary Rehab  Date  08/15/17  Educator  SB  Instruction Review Code  1- Verbalizes Understanding      General Nutrition Guidelines/Fats and Fiber: -Group instruction provided by verbal, written material, models and posters to present the general guidelines for heart healthy nutrition. Gives an  explanation and review of dietary fats and fiber.   Cardiac Rehab from 09/05/2017 in [facility] Cardiac and Pulmonary Rehab  Date  08/13/17  Educator  CR  Instruction Review Code  1- Verbalizes Understanding      Controlling Sodium/Reading Food Labels: -Group verbal and written material supporting the discussion of sodium use in heart healthy nutrition. Review and explanation with models, verbal and written materials for utilization of the food label.   Cardiac Rehab from 09/05/2017 in [facility] Cardiac and Pulmonary Rehab  Date  08/20/17  Educator  CR  Instruction Review Code  1- Verbalizes Understanding      Exercise Physiology & General Exercise Guidelines: - Group verbal and written instruction  with models to review the exercise physiology of the cardiovascular system and associated critical values. Provides general exercise guidelines with specific guidelines to those with heart or lung disease.    Cardiac Rehab from 09/05/2017 in [facility] Cardiac and Pulmonary Rehab  Date  08/29/17  Educator  [facility]  Instruction Review Code  1- Verbalizes Understanding      Aerobic Exercise & Resistance Training: - Gives group verbal and written instruction on the various components of exercise. Focuses on aerobic and resistive training programs and the benefits of this training and how to safely progress through these programs..   Cardiac Rehab from 09/05/2017 in [facility] Cardiac and Pulmonary Rehab  Date  09/03/17  Educator  [facility]  Instruction Review Code  1- Verbalizes Understanding      Flexibility, Balance, Mind/Body Relaxation: Provides group verbal/written instruction on the benefits of flexibility and balance training, including mind/body exercise modes such as yoga, pilates and tai chi.  Demonstration and skill practice provided.   Cardiac Rehab from 09/05/2017 in [facility] Cardiac and Pulmonary Rehab  Date  07/18/17  Educator  [facility]  Instruction Review Code  1- Verbalizes Understanding      Stress and Anxiety: -  Provides group verbal and written instruction about the health risks of elevated stress and causes of high stress.  Discuss the correlation between heart/lung disease and anxiety and treatment options. Review healthy ways to manage with stress and anxiety.   Cardiac Rehab from 09/05/2017 in [facility] Cardiac and Pulmonary Rehab  Date  08/08/17  Educator  [facility]  Instruction Review Code  1- Verbalizes Understanding      Depression: - Provides group verbal and written instruction on the correlation between heart/lung disease and depressed mood, treatment options, and the stigmas associated with seeking treatment.   Cardiac Rehab from 09/05/2017 in [facility] Cardiac and Pulmonary Rehab  Date  09/05/17  Educator  [facility]  Instruction Review Code  1- Verbalizes Understanding      Anatomy & Physiology of the Heart: - Group verbal and written instruction and models provide basic cardiac anatomy and physiology, with the coronary electrical and arterial systems. Review of Valvular disease and Heart Failure   Cardiac Rehab from 09/05/2017 in [facility] Cardiac and Pulmonary Rehab  Date  08/06/17  Educator  [facility]  Instruction Review Code  1- Verbalizes Understanding      Cardiac Procedures: - Group verbal and written instruction to review commonly prescribed medications for heart disease. Reviews the medication, class of the drug, and side effects. Includes the steps to properly store meds and maintain the prescription regimen. (beta blockers and nitrates)   Cardiac Rehab from 09/05/2017 in [facility] Cardiac and Pulmonary Rehab  Date  07/30/17  Educator  SB  Instruction Review Code  1- Verbalizes Understanding      Cardiac Medications I: - Group verbal and written instruction to review commonly prescribed medications for heart disease. Reviews the medication, class of the drug, and side effects. Includes the steps to properly store meds and maintain the prescription regimen.   Cardiac Rehab from 09/05/2017 in [facility] Cardiac and  Pulmonary Rehab  Date  07/23/17  Educator  CE  Instruction Review Code  1- Verbalizes Understanding      Cardiac Medications II: -Group verbal and written instruction to review commonly prescribed medications for heart disease. Reviews the medication, class of the drug, and side effects. (all other drug classes)    Go Sex-Intimacy & Heart Disease, Get SMART - Goal Setting: - Group verbal and written  instruction through game format to discuss heart disease and the return to sexual intimacy. Provides group verbal and written material to discuss and apply goal setting through the application of the S.M.A.R.T. Method.   Cardiac Rehab from 09/05/2017 in [facility] Cardiac and Pulmonary Rehab  Date  07/30/17  Educator  SB  Instruction Review Code  1- Verbalizes Understanding      Other Matters of the Heart: - Provides group verbal, written materials and models to describe Stable Angina and Peripheral Artery. Includes description of the disease process and treatment options available to the cardiac patient.   Exercise & Equipment Safety: - Individual verbal instruction and demonstration of equipment use and safety with use of the equipment.   Cardiac Rehab from 09/05/2017 in [facility] Cardiac and Pulmonary Rehab  Date  07/12/17  Educator  [facility]  Instruction Review Code  1- Verbalizes Understanding      Infection Prevention: - Provides verbal and written material to individual with discussion of infection control including proper hand washing and proper equipment cleaning during exercise session.   Cardiac Rehab from 09/05/2017 in [facility] Cardiac and Pulmonary Rehab  Date  07/12/17  Educator  [facility]  Instruction Review Code  1- Verbalizes Understanding      Falls Prevention: - Provides verbal and written material to individual with discussion of falls prevention and safety.   Cardiac Rehab from 09/05/2017 in [facility] Cardiac and Pulmonary Rehab  Date  07/12/17  Educator  [facility]  Instruction Review Code  1-  Verbalizes Understanding      Diabetes: - Individual verbal and written instruction to review signs/symptoms of diabetes, desired ranges of glucose level fasting, after meals and with exercise. Acknowledge that pre and post exercise glucose checks will be done for 3 sessions at entry of program.   Know Your Numbers and Risk Factors: -Group verbal and written instruction about important numbers in your health.  Discussion of what are risk factors and how they play a role in the disease process.  Review of Cholesterol, Blood Pressure, Diabetes, and BMI and the role they play in your overall health.   Sleep Hygiene: -Provides group verbal and written instruction about how sleep can affect your health.  Define sleep hygiene, discuss sleep cycles and impact of sleep habits. Review good sleep hygiene tips.    Cardiac Rehab from 09/05/2017 in [facility] Cardiac and Pulmonary Rehab  Date  08/22/17  Educator  [facility]  Instruction Review Code  1- Verbalizes Understanding      Other: -Provides group and verbal instruction on various topics (see comments)   Knowledge Questionnaire Score: Knowledge Questionnaire Score - 08/29/17 0805      Knowledge Questionnaire Score   Pre Score  23/26    Post Score  24/26 reviewed with patient       Core Components/Risk Factors/Patient Goals at Admission: Personal Goals and Risk Factors at Admission - 07/12/17 1450      Core Components/Risk Factors/Patient Goals on Admission    Weight Management  Yes;Obesity;Weight Loss    Intervention  Weight Management: Develop a combined nutrition and exercise program designed to reach desired caloric intake, while maintaining appropriate intake of nutrient and fiber, sodium and fats, and appropriate energy expenditure required for the weight goal.    Admit Weight  157 lb 11.2 oz (71.5 kg)    Goal Weight: Short Term  155 lb (70.3 kg)    Goal Weight: Long Term  140 lb (63.5 kg)    Expected Outcomes  Short Term: Continue  to  assess and modify interventions until short term weight is achieved;Long Term: Adherence to nutrition and physical activity/exercise program aimed toward attainment of established weight goal;Weight Loss: Understanding of general recommendations for a balanced deficit meal plan, which promotes 1-2 lb weight loss per week and includes a negative energy balance of [phone] kcal/d    Hypertension  Yes    Intervention  Provide education on lifestyle modifcations including regular physical activity/exercise, weight management, moderate sodium restriction and increased consumption of fresh fruit, vegetables, and low fat dairy, alcohol moderation, and smoking cessation.;Monitor prescription use compliance.    Expected Outcomes  Short Term: Continued assessment and intervention until BP is < 140/63m HG in hypertensive participants. < 130/851mHG in hypertensive participants with diabetes, heart failure or chronic kidney disease.;Long Term: Maintenance of blood pressure at goal levels.    Lipids  Yes    Intervention  Provide education and support for participant on nutrition & aerobic/resistive exercise along with prescribed medications to achieve LDL <7025mHDL >69m74m  Expected Outcomes  Short Term: Participant states understanding of desired cholesterol values and is compliant with medications prescribed. Participant is following exercise prescription and nutrition guidelines.;Long Term: Cholesterol controlled with medications as prescribed, with individualized exercise RX and with personalized nutrition plan. Value goals: LDL < 70mg50mL > 40 mg.    Stress  Yes    Intervention  Offer individual and/or small group education and counseling on adjustment to heart disease, stress management and health-related lifestyle change. Teach and support self-help strategies.;Refer participants experiencing significant psychosocial distress to appropriate mental health specialists for further evaluation and treatment. When  possible, include family members and significant others in education/counseling sessions.    Expected Outcomes  Short Term: Participant demonstrates changes in health-related behavior, relaxation and other stress management skills, ability to obtain effective social support, and compliance with psychotropic medications if prescribed.;Long Term: Emotional wellbeing is indicated by absence of clinically significant psychosocial distress or social isolation.       Core Components/Risk Factors/Patient Goals Review:  Goals and Risk Factor Review    Row Name 08/01/17 0802 08/22/17 0803           Core Components/Risk Factors/Patient Goals Review   Personal Goals Review  Weight Management/Obesity;Hypertension;Lipids  Weight Management/Obesity;Hypertension;Lipids      Review  Niaomi's weight has been holding steady.  She is more mindful about what she is eating.  Blood pressures have been good.  She checks them 2-3x a week at home and has us chKoreak it in class.   She is doing well on her medications.   AnitaAzureinues to hold steady on her weight, varies about 1-2 lbs a day.  She is still good with pressures as checks them at home.  She had labs done yesterday with good results.  Her lipid panel was much improved with HDL at 40and LDL at 53!!  She was quite pleased with herself.        Expected Outcomes  Short: Continue to work on weight loss. Long: Continue to monitor pressures.   Short: Continue to work on weight loss.  Long: Continue to monitor risk factors.          Core Components/Risk Factors/Patient Goals at Discharge (Final Review):  Goals and Risk Factor Review - 08/22/17 0803      Core Components/Risk Factors/Patient Goals Review   Personal Goals Review  Weight Management/Obesity;Hypertension;Lipids    Review  AnitaStarlainues to hold steady on her weight, varies about 1-2  lbs a day.  She is still good with pressures as checks them at home.  She had labs done yesterday with good results.  Her  lipid panel was much improved with HDL at 40and LDL at 53!!  She was quite pleased with herself.      Expected Outcomes  Short: Continue to work on weight loss.  Long: Continue to monitor risk factors.        ITP Comments: ITP Comments    Row Name 07/12/17 1159 07/18/17 0540 08/15/17 0525 09/05/17 0823     ITP Comments  Medical review completed. ITP sent to Dr Loleta Chance for review, changes as needed and signature.  Documentation of diagnosis can be found in [facility]5/23/2019  30 day review. Continue with ITP unless directed changes per Medical Director review.  HAs attended orientation only  30 day review. Continue with ITP unless directed changes per Medical Director review.    Discharge ITP sent and signed by Dr. Sabra Heck.  Discharge Summary routed to PCP and cardiologist.       Comments: Discharge ITP

## 2017-09-06 ENCOUNTER — Ambulatory Visit
Admission: RE | Admit: 2017-09-06 | Discharge: 2017-09-06 | Disposition: A | Discharge status: Home / Self Care | Payer: BLUE CROSS/BLUE SHIELD | Source: Ambulatory Visit | Attending: Radiation Oncology | Admitting: Radiation Oncology

## 2017-09-06 DIAGNOSIS — Z510 Encounter for antineoplastic radiation therapy: Secondary | ICD-10-CM | POA: Diagnosis not present

## 2017-09-06 DIAGNOSIS — Z170 Estrogen receptor positive status [ER+]: Secondary | ICD-10-CM | POA: Diagnosis not present

## 2017-09-06 DIAGNOSIS — C50412 Malignant neoplasm of upper-outer quadrant of left female breast: Secondary | ICD-10-CM | POA: Diagnosis not present

## 2017-09-07 ENCOUNTER — Ambulatory Visit
Admission: RE | Admit: 2017-09-07 | Discharge: 2017-09-07 | Disposition: A | Discharge status: Home / Self Care | Payer: BLUE CROSS/BLUE SHIELD | Source: Ambulatory Visit | Attending: Radiation Oncology | Admitting: Radiation Oncology

## 2017-09-07 DIAGNOSIS — C50412 Malignant neoplasm of upper-outer quadrant of left female breast: Secondary | ICD-10-CM | POA: Diagnosis not present

## 2017-09-07 DIAGNOSIS — Z510 Encounter for antineoplastic radiation therapy: Secondary | ICD-10-CM | POA: Diagnosis not present
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
## 2017-09-10 ENCOUNTER — Inpatient Hospital Stay: Payer: BLUE CROSS/BLUE SHIELD | Attending: Radiation Oncology

## 2017-09-10 ENCOUNTER — Other Ambulatory Visit: Payer: Self-pay

## 2017-09-10 ENCOUNTER — Ambulatory Visit
Admission: RE | Admit: 2017-09-10 | Discharge: 2017-09-10 | Disposition: A | Discharge status: Home / Self Care | Payer: BLUE CROSS/BLUE SHIELD | Source: Ambulatory Visit | Attending: Radiation Oncology | Admitting: Radiation Oncology

## 2017-09-10 DIAGNOSIS — Z170 Estrogen receptor positive status [ER+]: Principal | ICD-10-CM

## 2017-09-10 DIAGNOSIS — Z7982 Long term (current) use of aspirin: Secondary | ICD-10-CM | POA: Insufficient documentation

## 2017-09-10 DIAGNOSIS — C50412 Malignant neoplasm of upper-outer quadrant of left female breast: Secondary | ICD-10-CM | POA: Insufficient documentation

## 2017-09-10 DIAGNOSIS — I2510 Atherosclerotic heart disease of native coronary artery without angina pectoris: Secondary | ICD-10-CM | POA: Diagnosis not present

## 2017-09-10 DIAGNOSIS — Z79899 Other long term (current) drug therapy: Secondary | ICD-10-CM | POA: Insufficient documentation

## 2017-09-10 DIAGNOSIS — Z510 Encounter for antineoplastic radiation therapy: Secondary | ICD-10-CM | POA: Diagnosis not present

## 2017-09-10 LAB — CBC
HCT: 38.4 % (ref 35.0–47.0)
Hemoglobin: 13.1 g/dL (ref 12.0–16.0)
MCH: 31.7 pg (ref 26.0–34.0)
MCHC: 34.1 g/dL (ref 32.0–36.0)
MCV: 93.1 fL (ref 80.0–100.0)
PLATELETS: 229 10*3/uL (ref 150–440)
RBC: 4.13 MIL/uL (ref 3.80–5.20)
RDW: 14.2 % (ref 11.5–14.5)
WBC: 5 10*3/uL (ref 3.6–11.0)

## 2017-09-11 ENCOUNTER — Encounter: Payer: Self-pay | Admitting: General Surgery

## 2017-09-11 ENCOUNTER — Ambulatory Visit
Admission: RE | Admit: 2017-09-11 | Discharge: 2017-09-11 | Disposition: A | Discharge status: Home / Self Care | Payer: BLUE CROSS/BLUE SHIELD | Source: Ambulatory Visit | Attending: Radiation Oncology | Admitting: Radiation Oncology

## 2017-09-11 ENCOUNTER — Ambulatory Visit (INDEPENDENT_AMBULATORY_CARE_PROVIDER_SITE_OTHER): Payer: BLUE CROSS/BLUE SHIELD | Admitting: General Surgery

## 2017-09-11 VITALS — BP 122/71 | HR 63 | Resp 14 | Ht 63.0 in | Wt 158.0 lb

## 2017-09-11 DIAGNOSIS — C50412 Malignant neoplasm of upper-outer quadrant of left female breast: Secondary | ICD-10-CM

## 2017-09-11 DIAGNOSIS — Z510 Encounter for antineoplastic radiation therapy: Secondary | ICD-10-CM | POA: Diagnosis not present

## 2017-09-11 DIAGNOSIS — Z170 Estrogen receptor positive status [ER+]: Secondary | ICD-10-CM

## 2017-09-11 NOTE — Patient Instructions (Signed)
The patient has been asked to return to the office in 7 months with a bilateral diagnostic mammogram.
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
## 2017-09-12 ENCOUNTER — Ambulatory Visit
Admission: RE | Admit: 2017-09-12 | Discharge: 2017-09-12 | Disposition: A | Discharge status: Home / Self Care | Payer: BLUE CROSS/BLUE SHIELD | Source: Ambulatory Visit | Attending: Radiation Oncology | Admitting: Radiation Oncology

## 2017-09-12 DIAGNOSIS — C50412 Malignant neoplasm of upper-outer quadrant of left female breast: Secondary | ICD-10-CM | POA: Diagnosis not present

## 2017-09-12 DIAGNOSIS — Z510 Encounter for antineoplastic radiation therapy: Secondary | ICD-10-CM | POA: Diagnosis not present

## 2017-09-13 ENCOUNTER — Ambulatory Visit
Admission: RE | Admit: 2017-09-13 | Discharge: 2017-09-13 | Disposition: A | Discharge status: Home / Self Care | Payer: BLUE CROSS/BLUE SHIELD | Source: Ambulatory Visit | Attending: Radiation Oncology | Admitting: Radiation Oncology

## 2017-09-13 DIAGNOSIS — Z510 Encounter for antineoplastic radiation therapy: Secondary | ICD-10-CM | POA: Diagnosis not present

## 2017-09-13 DIAGNOSIS — C50412 Malignant neoplasm of upper-outer quadrant of left female breast: Secondary | ICD-10-CM | POA: Diagnosis not present

## 2017-09-14 ENCOUNTER — Ambulatory Visit
Admission: RE | Admit: 2017-09-14 | Discharge: 2017-09-14 | Disposition: A | Discharge status: Home / Self Care | Payer: BLUE CROSS/BLUE SHIELD | Source: Ambulatory Visit | Attending: Radiation Oncology | Admitting: Radiation Oncology

## 2017-09-14 DIAGNOSIS — Z510 Encounter for antineoplastic radiation therapy: Secondary | ICD-10-CM | POA: Diagnosis not present

## 2017-09-14 DIAGNOSIS — C50412 Malignant neoplasm of upper-outer quadrant of left female breast: Secondary | ICD-10-CM | POA: Diagnosis not present

## 2017-09-16 ENCOUNTER — Ambulatory Visit: Admission: RE | Admit: 2017-09-16 | Payer: BLUE CROSS/BLUE SHIELD | Source: Ambulatory Visit

## 2017-09-17 ENCOUNTER — Ambulatory Visit
Admission: RE | Admit: 2017-09-17 | Discharge: 2017-09-17 | Disposition: A | Discharge status: Home / Self Care | Payer: BLUE CROSS/BLUE SHIELD | Source: Ambulatory Visit | Attending: Radiation Oncology | Admitting: Radiation Oncology

## 2017-09-17 ENCOUNTER — Ambulatory Visit: Payer: BLUE CROSS/BLUE SHIELD

## 2017-09-17 DIAGNOSIS — Z170 Estrogen receptor positive status [ER+]: Secondary | ICD-10-CM | POA: Diagnosis not present

## 2017-09-17 DIAGNOSIS — C50412 Malignant neoplasm of upper-outer quadrant of left female breast: Secondary | ICD-10-CM | POA: Diagnosis not present

## 2017-09-17 DIAGNOSIS — Z510 Encounter for antineoplastic radiation therapy: Secondary | ICD-10-CM | POA: Diagnosis not present

## 2017-09-18 ENCOUNTER — Ambulatory Visit
Admission: RE | Admit: 2017-09-18 | Discharge: 2017-09-18 | Disposition: A | Discharge status: Home / Self Care | Payer: BLUE CROSS/BLUE SHIELD | Source: Ambulatory Visit | Attending: Radiation Oncology | Admitting: Radiation Oncology

## 2017-09-18 ENCOUNTER — Ambulatory Visit (INDEPENDENT_AMBULATORY_CARE_PROVIDER_SITE_OTHER): Payer: BLUE CROSS/BLUE SHIELD | Admitting: Obstetrics and Gynecology

## 2017-09-18 ENCOUNTER — Encounter: Payer: Self-pay | Admitting: Obstetrics and Gynecology

## 2017-09-18 VITALS — BP 137/79 | HR 69 | Ht 63.0 in | Wt 155.5 lb

## 2017-09-18 DIAGNOSIS — N952 Postmenopausal atrophic vaginitis: Secondary | ICD-10-CM

## 2017-09-18 DIAGNOSIS — Z90710 Acquired absence of both cervix and uterus: Secondary | ICD-10-CM | POA: Diagnosis not present

## 2017-09-18 DIAGNOSIS — Z1211 Encounter for screening for malignant neoplasm of colon: Secondary | ICD-10-CM | POA: Diagnosis not present

## 2017-09-18 DIAGNOSIS — I2542 Coronary artery dissection: Secondary | ICD-10-CM

## 2017-09-18 DIAGNOSIS — Z01419 Encounter for gynecological examination (general) (routine) without abnormal findings: Secondary | ICD-10-CM | POA: Diagnosis not present

## 2017-09-18 DIAGNOSIS — M8580 Other specified disorders of bone density and structure, unspecified site: Secondary | ICD-10-CM

## 2017-09-18 DIAGNOSIS — C50412 Malignant neoplasm of upper-outer quadrant of left female breast: Secondary | ICD-10-CM

## 2017-09-18 DIAGNOSIS — N393 Stress incontinence (female) (male): Secondary | ICD-10-CM

## 2017-09-18 DIAGNOSIS — Z170 Estrogen receptor positive status [ER+]: Secondary | ICD-10-CM | POA: Insufficient documentation

## 2017-09-18 DIAGNOSIS — N951 Menopausal and female climacteric states: Secondary | ICD-10-CM | POA: Diagnosis not present

## 2017-09-18 DIAGNOSIS — Z510 Encounter for antineoplastic radiation therapy: Secondary | ICD-10-CM | POA: Diagnosis not present

## 2017-09-18 NOTE — Patient Instructions (Signed)
1.  No Pap smear is done today.  Next Pap smear is due in 2020. 2.  Breast cancer screening and follow-up is through Dr. Tollie Pizza and the cancer center 3.  Screening labs are to be obtained through oncology 4.  Colon cancer screening with stool guaiac cards are given 5.  Continue with calcium 600 mg twice a day and vitamin D 400 international units twice a day for osteoporosis prevention 6.  Continue with regular weightbearing exercise to help with osteoporosis prevention.  This will become more imperative when antiestrogen therapy is started for breast cancer disease. 7.  Mindfulness therapy is suggested to help with managing stress from chronic disease (heart disease and breast cancer)-flyer for [facility] is given. 8.  Consider lubricants for vaginal dryness:  Virgin olive oil  Coconut oil  Astroglide  Jo H2O lubricant 9.  As hot flashes become worse, consider non-ERT medications for management including the following:  Clonidine patch (discussed with Dr. Rockey Situ if this is appropriate considering heart disease history)  SSRI antianxiety medications such as sertraline (Zoloft).  This may be initiated by Dr. Mike Gip and oncology or myself if desired. 10.  Stress incontinence may be managed with Kegel exercises.  Physical therapy consultation for enhancing core muscle and improving control of incontinence is also available.  Please contact her office if desired. 84.  Return in 1 year for annual exam.   Health Maintenance for Postmenopausal Women Menopause is a normal process in which your reproductive ability comes to an end. This process happens gradually over a span of months to years, usually between the ages of 72 and 15. Menopause is complete when you have missed 12 consecutive menstrual periods. It is important to talk with your health care provider about some of the most common conditions that affect postmenopausal women, such as heart disease, cancer, and bone loss  (osteoporosis). Adopting a healthy lifestyle and getting preventive care can help to promote your health and wellness. Those actions can also lower your chances of developing some of these common conditions. What should I know about menopause? During menopause, you may experience a number of symptoms, such as:  Moderate-to-severe hot flashes.  Night sweats.  Decrease in sex drive.  Mood swings.  Headaches.  Tiredness.  Irritability.  Memory problems.  Insomnia.  Choosing to treat or not to treat menopausal changes is an individual decision that you make with your health care provider. What should I know about hormone replacement therapy and supplements? Hormone therapy products are effective for treating symptoms that are associated with menopause, such as hot flashes and night sweats. Hormone replacement carries certain risks, especially as you become older. If you are thinking about using estrogen or estrogen with progestin treatments, discuss the benefits and risks with your health care provider. What should I know about heart disease and stroke? Heart disease, heart attack, and stroke become more likely as you age. This may be due, in part, to the hormonal changes that your body experiences during menopause. These can affect how your body processes dietary fats, triglycerides, and cholesterol. Heart attack and stroke are both medical emergencies. There are many things that you can do to help prevent heart disease and stroke:  Have your blood pressure checked at least every 1-2 years. High blood pressure causes heart disease and increases the risk of stroke.  If you are 82-34 years old, ask your health care provider if you should take aspirin to prevent a heart attack or a stroke.  Do not use any tobacco products, including cigarettes, chewing tobacco, or electronic cigarettes. If you need help quitting, ask your health care provider.  It is important to eat a healthy diet and  maintain a healthy weight. ? Be sure to include plenty of vegetables, fruits, low-fat dairy products, and lean protein. ? Avoid eating foods that are high in solid fats, added sugars, or salt (sodium).  Get regular exercise. This is one of the most important things that you can do for your health. ? Try to exercise for at least 150 minutes each week. The type of exercise that you do should increase your heart rate and make you sweat. This is known as moderate-intensity exercise. ? Try to do strengthening exercises at least twice each week. Do these in addition to the moderate-intensity exercise.  Know your numbers.Ask your health care provider to check your cholesterol and your blood glucose. Continue to have your blood tested as directed by your health care provider.  What should I know about cancer screening? There are several types of cancer. Take the following steps to reduce your risk and to catch any cancer development as early as possible. Breast Cancer  Practice breast self-awareness. ? This means understanding how your breasts normally appear and feel. ? It also means doing regular breast self-exams. Let your health care provider know about any changes, no matter how small.  If you are 58 or older, have a clinician do a breast exam (clinical breast exam or CBE) every year. Depending on your age, family history, and medical history, it may be recommended that you also have a yearly breast X-ray (mammogram).  If you have a family history of breast cancer, talk with your health care provider about genetic screening.  If you are at high risk for breast cancer, talk with your health care provider about having an MRI and a mammogram every year.  Breast cancer (BRCA) gene test is recommended for women who have family members with BRCA-related cancers. Results of the assessment will determine the need for genetic counseling and BRCA1 and for BRCA2 testing. BRCA-related cancers include these  types: ? Breast. This occurs in males or females. ? Ovarian. ? Tubal. This may also be called fallopian tube cancer. ? Cancer of the abdominal or pelvic lining (peritoneal cancer). ? Prostate. ? Pancreatic.  Cervical, Uterine, and Ovarian Cancer Your health care provider may recommend that you be screened regularly for cancer of the pelvic organs. These include your ovaries, uterus, and vagina. This screening involves a pelvic exam, which includes checking for microscopic changes to the surface of your cervix (Pap test).  For women ages 21-65, health care providers may recommend a pelvic exam and a Pap test every three years. For women ages 48-65, they may recommend the Pap test and pelvic exam, combined with testing for human papilloma virus (HPV), every five years. Some types of HPV increase your risk of cervical cancer. Testing for HPV may also be done on women of any age who have unclear Pap test results.  Other health care providers may not recommend any screening for nonpregnant women who are considered low risk for pelvic cancer and have no symptoms. Ask your health care provider if a screening pelvic exam is right for you.  If you have had past treatment for cervical cancer or a condition that could lead to cancer, you need Pap tests and screening for cancer for at least 20 years after your treatment. If Pap tests have been discontinued  for you, your risk factors (such as having a new sexual partner) need to be reassessed to determine if you should start having screenings again. Some women have medical problems that increase the chance of getting cervical cancer. In these cases, your health care provider may recommend that you have screening and Pap tests more often.  If you have a family history of uterine cancer or ovarian cancer, talk with your health care provider about genetic screening.  If you have vaginal bleeding after reaching menopause, tell your health care provider.  There  are currently no reliable tests available to screen for ovarian cancer.  Lung Cancer Lung cancer screening is recommended for adults 44-22 years old who are at high risk for lung cancer because of a history of smoking. A yearly low-dose CT scan of the lungs is recommended if you:  Currently smoke.  Have a history of at least 30 pack-years of smoking and you currently smoke or have quit within the past 15 years. A pack-year is smoking an average of one pack of cigarettes per day for one year.  Yearly screening should:  Continue until it has been 15 years since you quit.  Stop if you develop a health problem that would prevent you from having lung cancer treatment.  Colorectal Cancer  This type of cancer can be detected and can often be prevented.  Routine colorectal cancer screening usually begins at age 51 and continues through age 31.  If you have risk factors for colon cancer, your health care provider may recommend that you be screened at an earlier age.  If you have a family history of colorectal cancer, talk with your health care provider about genetic screening.  Your health care provider may also recommend using home test kits to check for hidden blood in your stool.  A small camera at the end of a tube can be used to examine your colon directly (sigmoidoscopy or colonoscopy). This is done to check for the earliest forms of colorectal cancer.  Direct examination of the colon should be repeated every 5-10 years until age 86. However, if early forms of precancerous polyps or small growths are found or if you have a family history or genetic risk for colorectal cancer, you may need to be screened more often.  Skin Cancer  Check your skin from head to toe regularly.  Monitor any moles. Be sure to tell your health care provider: ? About any new moles or changes in moles, especially if there is a change in a mole's shape or color. ? If you have a mole that is larger than the  size of a pencil eraser.  If any of your family members has a history of skin cancer, especially at a young age, talk with your health care provider about genetic screening.  Always use sunscreen. Apply sunscreen liberally and repeatedly throughout the day.  Whenever you are outside, protect yourself by wearing long sleeves, pants, a wide-brimmed hat, and sunglasses.  What should I know about osteoporosis? Osteoporosis is a condition in which bone destruction happens more quickly than new bone creation. After menopause, you may be at an increased risk for osteoporosis. To help prevent osteoporosis or the bone fractures that can happen because of osteoporosis, the following is recommended:  If you are 3-40 years old, get at least 1,000 mg of calcium and at least 600 mg of vitamin D per day.  If you are older than age 73 but younger than age 59, get  at least 1,200 mg of calcium and at least 600 mg of vitamin D per day.  If you are older than age 47, get at least 1,200 mg of calcium and at least 800 mg of vitamin D per day.  Smoking and excessive alcohol intake increase the risk of osteoporosis. Eat foods that are rich in calcium and vitamin D, and do weight-bearing exercises several times each week as directed by your health care provider. What should I know about how menopause affects my mental health? Depression may occur at any age, but it is more common as you become older. Common symptoms of depression include:  Low or sad mood.  Changes in sleep patterns.  Changes in appetite or eating patterns.  Feeling an overall lack of motivation or enjoyment of activities that you previously enjoyed.  Frequent crying spells.  Talk with your health care provider if you think that you are experiencing depression. What should I know about immunizations? It is important that you get and maintain your immunizations. These include:  Tetanus, diphtheria, and pertussis (Tdap) booster  vaccine.  Influenza every year before the flu season begins.  Pneumonia vaccine.  Shingles vaccine.  Your health care provider may also recommend other immunizations. This information is not intended to replace advice given to you by your health care provider. Make sure you discuss any questions you have with your health care provider. Document Released: 03/10/2005 Document Revised: 08/06/2015 Document Reviewed: 10/20/2014 Elsevier Interactive Patient Education  2018 [employer].

## 2017-09-25 ENCOUNTER — Inpatient Hospital Stay (HOSPITAL_BASED_OUTPATIENT_CLINIC_OR_DEPARTMENT_OTHER): Payer: BLUE CROSS/BLUE SHIELD | Admitting: Hematology and Oncology

## 2017-09-25 ENCOUNTER — Encounter: Payer: Self-pay | Admitting: Hematology and Oncology

## 2017-09-25 ENCOUNTER — Other Ambulatory Visit: Payer: Self-pay

## 2017-09-25 VITALS — BP 112/74 | HR 79 | Temp 96.2°F | Resp 18 | Wt 154.7 lb

## 2017-09-25 DIAGNOSIS — Z79899 Other long term (current) drug therapy: Secondary | ICD-10-CM | POA: Diagnosis not present

## 2017-09-25 DIAGNOSIS — Z170 Estrogen receptor positive status [ER+]: Secondary | ICD-10-CM

## 2017-09-25 DIAGNOSIS — Z7982 Long term (current) use of aspirin: Secondary | ICD-10-CM

## 2017-09-25 DIAGNOSIS — C50412 Malignant neoplasm of upper-outer quadrant of left female breast: Secondary | ICD-10-CM

## 2017-09-25 DIAGNOSIS — I2510 Atherosclerotic heart disease of native coronary artery without angina pectoris: Secondary | ICD-10-CM

## 2017-09-25 NOTE — Progress Notes (Signed)
Here for follow up. Doing " pretty good except for my hot flashes "

## 2017-09-25 NOTE — Patient Instructions (Signed)
Venlafaxine tablets What is this medicine? VENLAFAXINE (VEN la fax een) is used to treat depression, anxiety and panic disorder. This medicine may be used for other purposes; ask your health care provider or pharmacist if you have questions. COMMON BRAND NAME(S): Effexor What should I tell my health care provider before I take this medicine? They need to know if you have any of these conditions: -bleeding disorders -glaucoma -heart disease -high blood pressure -high cholesterol -kidney disease -liver disease -low levels of sodium in the blood -mania or bipolar disorder -seizures -suicidal thoughts, plans, or attempt; a previous suicide attempt by you or a family -take medicines that treat or prevent blood clots -thyroid disease -an unusual or allergic reaction to venlafaxine, desvenlafaxine, other medicines, foods, dyes, or preservatives -pregnant or trying to get pregnant -breast-feeding How should I use this medicine? Take this medicine by mouth with a glass of water. Follow the directions on the prescription label. Take it with food. Take your medicine at regular intervals. Do not take your medicine more often than directed. Do not stop taking this medicine suddenly except upon the advice of your doctor. Stopping this medicine too quickly may cause serious side effects or your condition may worsen. A special MedGuide will be given to you by the pharmacist with each prescription and refill. Be sure to read this information carefully each time. Talk to your pediatrician regarding the use of this medicine in children. Special care may be needed. Overdosage: If you think you have taken too much of this medicine contact a poison control center or emergency room at once. NOTE: This medicine is only for you. Do not share this medicine with others. What if I miss a dose? If you miss a dose, take it as soon as you can. If it is almost time for your next dose, take only that dose. Do not take  double or extra doses. What may interact with this medicine? Do not take this medicine with any of the following medications: -certain medicines for fungal infections like fluconazole, itraconazole, ketoconazole, posaconazole, voriconazole -cisapride -desvenlafaxine -dofetilide -dronedarone -duloxetine -levomilnacipran -linezolid -MAOIs like Carbex, Eldepryl, Marplan, Nardil, and Parnate -methylene blue (injected into a vein) -milnacipran -pimozide -thioridazine -ziprasidone This medicine may also interact with the following medications: -amphetamines -aspirin and aspirin-like medicines -certain medicines for depression, anxiety, or psychotic disturbances -certain medicines for migraine headaches like almotriptan, eletriptan, frovatriptan, naratriptan, rizatriptan, sumatriptan, zolmitriptan -certain medicines for sleep -certain medicines that treat or prevent blood clots like dalteparin, enoxaparin, warfarin -cimetidine -clozapine -diuretics -fentanyl -furazolidone -indinavir -isoniazid -lithium -metoprolol -NSAIDS, medicines for pain and inflammation, like ibuprofen or naproxen -other medicines that prolong the QT interval (cause an abnormal heart rhythm) -procarbazine -rasagiline -supplements like St. John's wort, kava kava, valerian -tramadol -tryptophan This list may not describe all possible interactions. Give your health care provider a list of all the medicines, herbs, non-prescription drugs, or dietary supplements you use. Also tell them if you smoke, drink alcohol, or use illegal drugs. Some items may interact with your medicine. What should I watch for while using this medicine? Tell your doctor if your symptoms do not get better or if they get worse. Visit your doctor or health care professional for regular checks on your progress. Because it may take several weeks to see the full effects of this medicine, it is important to continue your treatment as prescribed  by your doctor. Patients and their families should watch out for new or worsening thoughts of suicide or depression. Also   watch out for sudden changes in feelings such as feeling anxious, agitated, panicky, irritable, hostile, aggressive, impulsive, severely restless, overly excited and hyperactive, or not being able to sleep. If this happens, especially at the beginning of treatment or after a change in dose, call your health care professional. This medicine can cause an increase in blood pressure. Check with your doctor for instructions on monitoring your blood pressure while taking this medicine. You may get drowsy or dizzy. Do not drive, use machinery, or do anything that needs mental alertness until you know how this medicine affects you. Do not stand or sit up quickly, especially if you are an older patient. This reduces the risk of dizzy or fainting spells. Alcohol may interfere with the effect of this medicine. Avoid alcoholic drinks. Your mouth may get dry. Chewing sugarless gum, sucking hard candy and drinking plenty of water will help. Contact your doctor if the problem does not go away or is severe. What side effects may I notice from receiving this medicine? Side effects that you should report to your doctor or health care professional as soon as possible: -allergic reactions like skin rash, itching or hives, swelling of the face, lips, or tongue -anxious -breathing problems -confusion -changes in vision -chest pain -confusion -elevated mood, decreased need for sleep, racing thoughts, impulsive behavior -eye pain -fast, irregular heartbeat -feeling faint or lightheaded, falls -feeling agitated, angry, or irritable -hallucination, loss of contact with reality -high blood pressure -loss of balance or coordination -palpitations -redness, blistering, peeling or loosening of the skin, including inside the mouth -restlessness, pacing, inability to keep still -seizures -stiff  muscles -suicidal thoughts or other mood changes -trouble passing urine or change in the amount of urine -trouble sleeping -unusual bleeding or bruising -unusually weak or tired -vomiting Side effects that usually do not require medical attention (report to your doctor or health care professional if they continue or are bothersome): -change in sex drive or performance -change in appetite or weight -constipation -dizziness -dry mouth -headache -increased sweating -nausea -tired This list may not describe all possible side effects. Call your doctor for medical advice about side effects. You may report side effects to FDA at 1-800-FDA-1088. Where should I keep my medicine? Keep out of the reach of children. Store at a controlled temperature between 20 and 25 degrees C (68 and 77 degrees F), in a dry place. Throw away any unused medicine after the expiration date. NOTE: This sheet is a summary. It may not cover all possible information. If you have questions about this medicine, talk to your doctor, pharmacist, or health care provider.  2018 Elsevier/Gold Standard (2015-06-17 18:42:26)
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
## 2017-09-27 DIAGNOSIS — Z1211 Encounter for screening for malignant neoplasm of colon: Secondary | ICD-10-CM | POA: Diagnosis not present

## 2017-09-30 LAB — FECAL OCCULT BLOOD, IMMUNOCHEMICAL: Fecal Occult Bld: NEGATIVE

## 2017-10-09 ENCOUNTER — Inpatient Hospital Stay: Payer: BLUE CROSS/BLUE SHIELD | Attending: Hematology and Oncology

## 2017-10-09 DIAGNOSIS — Z170 Estrogen receptor positive status [ER+]: Secondary | ICD-10-CM | POA: Diagnosis not present

## 2017-10-09 DIAGNOSIS — C50412 Malignant neoplasm of upper-outer quadrant of left female breast: Secondary | ICD-10-CM | POA: Insufficient documentation

## 2017-10-09 DIAGNOSIS — Z79811 Long term (current) use of aromatase inhibitors: Secondary | ICD-10-CM | POA: Diagnosis not present

## 2017-10-09 LAB — LIPID PANEL
Cholesterol: 114 mg/dL (ref 0–200)
HDL: 39 mg/dL — ABNORMAL LOW (ref >40)
LDL Cholesterol: 52 mg/dL (ref 0–99)
Total CHOL/HDL Ratio: 2.9 RATIO
Triglycerides: 117 mg/dL (ref <150)
VLDL: 23 mg/dL (ref 0–40)

## 2017-10-09 LAB — COMPREHENSIVE METABOLIC PANEL
ALT: 29 U/L (ref 0–44)
AST: 26 U/L (ref 15–41)
Albumin: 4.5 g/dL (ref 3.5–5.0)
Alkaline Phosphatase: 82 U/L (ref 38–126)
Anion gap: 10 (ref 5–15)
BUN: 17 mg/dL (ref 8–23)
CO2: 26 mmol/L (ref 22–32)
Calcium: 9.2 mg/dL (ref 8.9–10.3)
Chloride: 107 mmol/L (ref 98–111)
Creatinine, Ser: 0.87 mg/dL (ref 0.44–1.00)
GFR calc Af Amer: >60 mL/min (ref >60)
GFR calc non Af Amer: >60 mL/min (ref >60)
Glucose, Bld: 110 mg/dL — ABNORMAL HIGH (ref 70–99)
Potassium: 4.1 mmol/L (ref 3.5–5.1)
Sodium: 143 mmol/L (ref 135–145)
Total Bilirubin: 1 mg/dL (ref 0.3–1.2)
Total Protein: 7.8 g/dL (ref 6.5–8.1)

## 2017-10-09 LAB — CBC WITH DIFFERENTIAL/PLATELET
Basophils Absolute: 0 10*3/uL (ref 0–0.1)
Basophils Relative: 1 %
Eosinophils Absolute: 0.1 10*3/uL (ref 0–0.7)
Eosinophils Relative: 3 %
HCT: 39 % (ref 35.0–47.0)
Hemoglobin: 13.1 g/dL (ref 12.0–16.0)
Lymphocytes Relative: 22 %
Lymphs Abs: 0.9 10*3/uL — ABNORMAL LOW (ref 1.0–3.6)
MCH: 31.2 pg (ref 26.0–34.0)
MCHC: 33.6 g/dL (ref 32.0–36.0)
MCV: 92.9 fL (ref 80.0–100.0)
Monocytes Absolute: 0.4 10*3/uL (ref 0.2–0.9)
Monocytes Relative: 9 %
Neutro Abs: 2.7 10*3/uL (ref 1.4–6.5)
Neutrophils Relative %: 65 %
Platelets: 230 10*3/uL (ref 150–440)
RBC: 4.19 MIL/uL (ref 3.80–5.20)
RDW: 13.8 % (ref 11.5–14.5)
WBC: 4.1 10*3/uL (ref 3.6–11.0)

## 2017-10-26 ENCOUNTER — Encounter: Payer: Self-pay | Admitting: Radiation Oncology

## 2017-10-26 ENCOUNTER — Other Ambulatory Visit: Payer: Self-pay

## 2017-10-26 ENCOUNTER — Ambulatory Visit
Admission: RE | Admit: 2017-10-26 | Discharge: 2017-10-26 | Disposition: A | Discharge status: Home / Self Care | Payer: BLUE CROSS/BLUE SHIELD | Source: Ambulatory Visit | Attending: Radiation Oncology | Admitting: Radiation Oncology

## 2017-10-26 VITALS — BP 135/75 | HR 72 | Temp 97.6°F | Resp 18 | Wt 155.2 lb

## 2017-10-26 DIAGNOSIS — Z170 Estrogen receptor positive status [ER+]: Secondary | ICD-10-CM | POA: Diagnosis not present

## 2017-10-26 DIAGNOSIS — C50912 Malignant neoplasm of unspecified site of left female breast: Secondary | ICD-10-CM | POA: Insufficient documentation

## 2017-10-26 DIAGNOSIS — Z923 Personal history of irradiation: Secondary | ICD-10-CM | POA: Diagnosis not present

## 2017-10-26 DIAGNOSIS — C50412 Malignant neoplasm of upper-outer quadrant of left female breast: Secondary | ICD-10-CM

## 2017-10-26 NOTE — Progress Notes (Signed)
Radiation Oncology Follow up Note  Name: Maria Barnes   Date:   10/26/2017 MRN:  [id number] DOB: 08-29-1955    This 62 y.o. female presents to the clinic today for one-month follow-up status post whole breast radiation to her left breast for stage I invasive mammary carcinoma ER/PR positive.  REFERRING PROVIDER: Maryland Pink, MD  HPI: patient is a 62 year old female now 1 month out having completed whole breast radiation to her left breast status post wide local excision for ER/PR positive invasive mammary carcinoma stage TIc. Seen today in routine follow-up she is doing well. She specifically denies breast tenderness cough or bone pain. She's not yet started on anti-estrogen therapy although has an appointment in about a week with Dr. Mike Barnes..  COMPLICATIONS OF TREATMENT: none  FOLLOW UP COMPLIANCE: keeps appointments   PHYSICAL EXAM:  BP 135/75 (BP Location: Left Arm, Patient Position: Sitting)   Pulse 72   Temp 97.6 F (36.4 C) (Tympanic)   Resp 18   Wt 155 lb 3.3 oz (70.4 kg)   BMI 27.49 kg/m  Lungs are clear to A&P cardiac examination essentially unremarkable with regular rate and rhythm. No dominant mass or nodularity is noted in either breast in 2 positions examined. Incision is well-healed. No axillary or supraclavicular adenopathy is appreciated. Cosmetic result is excellent.Well-developed well-nourished patient in NAD. HEENT reveals PERLA, EOMI, discs not visualized.  Oral cavity is clear. No oral mucosal lesions are identified. Neck is clear without evidence of cervical or supraclavicular adenopathy. Lungs are clear to A&P. Cardiac examination is essentially unremarkable with regular rate and rhythm without murmur rub or thrill. Abdomen is benign with no organomegaly or masses noted. Motor sensory and DTR levels are equal and symmetric in the upper and lower extremities. Cranial nerves II through XII are grossly intact. Proprioception is intact. No peripheral adenopathy or  edema is identified. No motor or sensory levels are noted. Crude visual fields are within normal range.  RADIOLOGY RESULTS: no current films for review  PLAN: present time patient is doing well 1 month out from whole breast radiation. I'm please were overall progress she has an excellent cosmetic result. I've asked to see her out 45 months for follow-up. Will be discussing antiestrogen therapy with Dr. Mike Barnes. Patient knows to call with any concerns.  I would like to take this opportunity to thank you for allowing me to participate in the care of your patient.Maria Filbert, MD

## 2017-11-06 ENCOUNTER — Encounter: Payer: Self-pay | Admitting: Hematology and Oncology

## 2017-11-06 ENCOUNTER — Inpatient Hospital Stay: Payer: BLUE CROSS/BLUE SHIELD | Attending: Hematology and Oncology

## 2017-11-06 ENCOUNTER — Inpatient Hospital Stay (HOSPITAL_BASED_OUTPATIENT_CLINIC_OR_DEPARTMENT_OTHER): Payer: BLUE CROSS/BLUE SHIELD | Admitting: Hematology and Oncology

## 2017-11-06 VITALS — BP 132/84 | HR 74 | Temp 98.9°F | Resp 18 | Ht 63.0 in | Wt 156.6 lb

## 2017-11-06 DIAGNOSIS — I2542 Coronary artery dissection: Secondary | ICD-10-CM | POA: Diagnosis not present

## 2017-11-06 DIAGNOSIS — K449 Diaphragmatic hernia without obstruction or gangrene: Secondary | ICD-10-CM | POA: Diagnosis not present

## 2017-11-06 DIAGNOSIS — Z79899 Other long term (current) drug therapy: Secondary | ICD-10-CM

## 2017-11-06 DIAGNOSIS — Z803 Family history of malignant neoplasm of breast: Secondary | ICD-10-CM | POA: Insufficient documentation

## 2017-11-06 DIAGNOSIS — I10 Essential (primary) hypertension: Secondary | ICD-10-CM | POA: Insufficient documentation

## 2017-11-06 DIAGNOSIS — K219 Gastro-esophageal reflux disease without esophagitis: Secondary | ICD-10-CM | POA: Insufficient documentation

## 2017-11-06 DIAGNOSIS — M8580 Other specified disorders of bone density and structure, unspecified site: Secondary | ICD-10-CM | POA: Insufficient documentation

## 2017-11-06 DIAGNOSIS — Z170 Estrogen receptor positive status [ER+]: Secondary | ICD-10-CM | POA: Insufficient documentation

## 2017-11-06 DIAGNOSIS — C50412 Malignant neoplasm of upper-outer quadrant of left female breast: Secondary | ICD-10-CM | POA: Diagnosis not present

## 2017-11-06 DIAGNOSIS — Z7982 Long term (current) use of aspirin: Secondary | ICD-10-CM | POA: Insufficient documentation

## 2017-11-06 DIAGNOSIS — I252 Old myocardial infarction: Secondary | ICD-10-CM | POA: Insufficient documentation

## 2017-11-06 LAB — CBC WITH DIFFERENTIAL/PLATELET
Basophils Absolute: 0.1 10*3/uL (ref 0–0.1)
Basophils Relative: 1 %
Eosinophils Absolute: 0.1 10*3/uL (ref 0–0.7)
Eosinophils Relative: 3 %
HCT: 37.3 % (ref 35.0–47.0)
Hemoglobin: 12.8 g/dL (ref 12.0–16.0)
Lymphocytes Relative: 22 %
Lymphs Abs: 1 10*3/uL (ref 1.0–3.6)
MCH: 31.6 pg (ref 26.0–34.0)
MCHC: 34.4 g/dL (ref 32.0–36.0)
MCV: 92.1 fL (ref 80.0–100.0)
Monocytes Absolute: 0.3 10*3/uL (ref 0.2–0.9)
Monocytes Relative: 7 %
Neutro Abs: 3.2 10*3/uL (ref 1.4–6.5)
Neutrophils Relative %: 67 %
Platelets: 250 10*3/uL (ref 150–440)
RBC: 4.05 MIL/uL (ref 3.80–5.20)
RDW: 13.5 % (ref 11.5–14.5)
WBC: 4.7 10*3/uL (ref 3.6–11.0)

## 2017-11-06 LAB — COMPREHENSIVE METABOLIC PANEL
ALT: 26 U/L (ref 0–44)
AST: 23 U/L (ref 15–41)
Albumin: 4 g/dL (ref 3.5–5.0)
Alkaline Phosphatase: 81 U/L (ref 38–126)
Anion gap: 9 (ref 5–15)
BUN: 14 mg/dL (ref 8–23)
CO2: 26 mmol/L (ref 22–32)
Calcium: 9.1 mg/dL (ref 8.9–10.3)
Chloride: 109 mmol/L (ref 98–111)
Creatinine, Ser: 0.91 mg/dL (ref 0.44–1.00)
GFR calc Af Amer: >60 mL/min (ref >60)
GFR calc non Af Amer: >60 mL/min (ref >60)
Glucose, Bld: 108 mg/dL — ABNORMAL HIGH (ref 70–99)
Potassium: 4.1 mmol/L (ref 3.5–5.1)
Sodium: 144 mmol/L (ref 135–145)
Total Bilirubin: 0.8 mg/dL (ref 0.3–1.2)
Total Protein: 7.5 g/dL (ref 6.5–8.1)

## 2017-11-06 MED ORDER — LETROZOLE 2.5 MG PO TABS: 2.5000 mg | ORAL_TABLET | Freq: Every day | ORAL | 0 refills | Status: DC

## 2017-11-06 NOTE — Progress Notes (Signed)
No new changes noted today 
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
## 2017-11-08 ENCOUNTER — Other Ambulatory Visit: Payer: Self-pay

## 2017-11-08 ENCOUNTER — Emergency Department: Payer: BLUE CROSS/BLUE SHIELD

## 2017-11-08 ENCOUNTER — Telehealth: Payer: Self-pay | Admitting: Cardiovascular Disease

## 2017-11-08 ENCOUNTER — Emergency Department
Admission: EM | Admit: 2017-11-08 | Discharge: 2017-11-08 | Disposition: A | Discharge status: Home / Self Care | Payer: BLUE CROSS/BLUE SHIELD | Attending: Emergency Medicine | Admitting: Emergency Medicine

## 2017-11-08 ENCOUNTER — Encounter: Payer: Self-pay | Admitting: Emergency Medicine

## 2017-11-08 DIAGNOSIS — R079 Chest pain, unspecified: Secondary | ICD-10-CM

## 2017-11-08 DIAGNOSIS — Z853 Personal history of malignant neoplasm of breast: Secondary | ICD-10-CM | POA: Diagnosis not present

## 2017-11-08 DIAGNOSIS — I5030 Unspecified diastolic (congestive) heart failure: Secondary | ICD-10-CM | POA: Diagnosis not present

## 2017-11-08 DIAGNOSIS — Z79899 Other long term (current) drug therapy: Secondary | ICD-10-CM | POA: Insufficient documentation

## 2017-11-08 DIAGNOSIS — Z7982 Long term (current) use of aspirin: Secondary | ICD-10-CM | POA: Insufficient documentation

## 2017-11-08 DIAGNOSIS — R0789 Other chest pain: Secondary | ICD-10-CM | POA: Diagnosis not present

## 2017-11-08 DIAGNOSIS — I110 Hypertensive heart disease with heart failure: Secondary | ICD-10-CM | POA: Insufficient documentation

## 2017-11-08 DIAGNOSIS — I252 Old myocardial infarction: Secondary | ICD-10-CM | POA: Diagnosis not present

## 2017-11-08 DIAGNOSIS — I2510 Atherosclerotic heart disease of native coronary artery without angina pectoris: Secondary | ICD-10-CM | POA: Diagnosis not present

## 2017-11-08 LAB — TROPONIN I: Troponin I: <0.03 ng/mL (ref <0.03)

## 2017-11-08 LAB — BASIC METABOLIC PANEL
Anion gap: 10 (ref 5–15)
BUN: 14 mg/dL (ref 8–23)
CHLORIDE: 109 mmol/L (ref 98–111)
CO2: 24 mmol/L (ref 22–32)
Calcium: 8.9 mg/dL (ref 8.9–10.3)
Creatinine, Ser: 0.89 mg/dL (ref 0.44–1.00)
GFR calc Af Amer: >60 mL/min (ref >60)
GFR calc non Af Amer: >60 mL/min (ref >60)
GLUCOSE: 124 mg/dL — AB (ref 70–99)
POTASSIUM: 3.8 mmol/L (ref 3.5–5.1)
Sodium: 143 mmol/L (ref 135–145)

## 2017-11-08 LAB — CBC
HEMATOCRIT: 39 % (ref 36.0–46.0)
HEMOGLOBIN: 13 g/dL (ref 12.0–15.0)
MCH: 30.8 pg (ref 26.0–34.0)
MCHC: 33.3 g/dL (ref 30.0–36.0)
MCV: 92.4 fL (ref 80.0–100.0)
Platelets: 237 10*3/uL (ref 150–400)
RBC: 4.22 MIL/uL (ref 3.87–5.11)
RDW: 13.1 % (ref 11.5–15.5)
WBC: 5.2 10*3/uL (ref 4.0–10.5)
nRBC: 0 % (ref 0.0–0.2)

## 2017-11-08 LAB — CANCER ANTIGEN 27.29: CA 27.29: 17.7 U/mL (ref 0.0–38.6)
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
## 2017-11-08 NOTE — Telephone Encounter (Signed)
Pt c/o of Chest Pain: STAT if CP now or developed within 24 hours  1. Are you having CP right now? Yes, more SOB  2. Are you experiencing any other symptoms (ex. SOB, nausea, vomiting, sweating)? SOB  3. How long have you been experiencing CP? Since Sunday 10/6  4. Is your CP continuous or coming and going? Comes and goes  5. Have you taken Nitroglycerin? 2 Nitro on Sunday, 1 on Monday.  ?

## 2017-11-08 NOTE — ED Triage Notes (Signed)
PT to ED via POV with c/o CP x2days. Denies any other symptoms. A&OX4, Hx of MI

## 2017-11-08 NOTE — Telephone Encounter (Signed)
Called the pt and she was exercising and got very "winded" so now she is in the ER. I reassured her that she did the right thing by going and she agreed.

## 2017-11-13 ENCOUNTER — Encounter: Payer: Self-pay | Admitting: Nurse Practitioner

## 2017-11-13 ENCOUNTER — Ambulatory Visit (INDEPENDENT_AMBULATORY_CARE_PROVIDER_SITE_OTHER): Payer: BLUE CROSS/BLUE SHIELD | Admitting: Nurse Practitioner

## 2017-11-13 VITALS — BP 110/70 | HR 67 | Ht 63.0 in | Wt 155.5 lb

## 2017-11-13 DIAGNOSIS — R072 Precordial pain: Secondary | ICD-10-CM | POA: Diagnosis not present

## 2017-11-13 DIAGNOSIS — I10 Essential (primary) hypertension: Secondary | ICD-10-CM | POA: Diagnosis not present

## 2017-11-13 DIAGNOSIS — I2542 Coronary artery dissection: Secondary | ICD-10-CM

## 2017-11-13 DIAGNOSIS — E785 Hyperlipidemia, unspecified: Secondary | ICD-10-CM
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
## 2017-11-13 NOTE — Patient Instructions (Signed)
Medication Instructions:  Your physician recommends that you continue on your current medications as directed. Please refer to the Current Medication list given to you today.  If you need a refill on your cardiac medications before your next appointment, please call your pharmacy.   Lab work: none If you have labs (blood work) drawn today and your tests are completely normal, you will receive your results only by: Marland Kitchen MyChart Message (if you have MyChart) OR . A paper copy in the mail If you have any lab test that is abnormal or we need to change your treatment, we will call you to review the results.  Testing/Procedures: Your physician has requested that you have an echocardiogram. Echocardiography is a painless test that uses sound waves to create images of your heart. It provides your doctor with information about the size and shape of your heart and how well your heart's chambers and valves are working. This procedure takes approximately one hour. There are no restrictions for this procedure. You may get an IV, if needed, to receive an ultrasound enhancing agent through to better visualize your heart.    Follow-Up: At [facility], you and your health needs are our priority.  As part of our continuing mission to provide you with exceptional heart care, we have created designated Provider Care Teams.  These Care Teams include your primary Cardiologist (physician) and Advanced Practice Providers (APPs -  Physician Assistants and Nurse Practitioners) who all work together to provide you with the care you need, when you need it. You will need a follow up appointment in 1 months.   You may see Ida Rogue, MD or one of the following Advanced Practice Providers on your designated Care Team:   Murray Hodgkins, NP Christell Faith, PA-C . Marrianne Mood, PA-C    Echocardiogram An echocardiogram, or echocardiography, uses sound waves (ultrasound) to produce an image of your heart. The  echocardiogram is simple, painless, obtained within a short period of time, and offers valuable information to your health care provider. The images from an echocardiogram can provide information such as:  Evidence of coronary artery disease (CAD).  Heart size.  Heart muscle function.  Heart valve function.  Aneurysm detection.  Evidence of a past heart attack.  Fluid buildup around the heart.  Heart muscle thickening.  Assess heart valve function.  Tell a health care provider about:  Any allergies you have.  All medicines you are taking, including vitamins, herbs, eye drops, creams, and over-the-counter medicines.  Any problems you or family members have had with anesthetic medicines.  Any blood disorders you have.  Any surgeries you have had.  Any medical conditions you have.  Whether you are pregnant or may be pregnant. What happens before the procedure? No special preparation is needed. Eat and drink normally. What happens during the procedure?  In order to produce an image of your heart, gel will be applied to your chest and a wand-like tool (transducer) will be moved over your chest. The gel will help transmit the sound waves from the transducer. The sound waves will harmlessly bounce off your heart to allow the heart images to be captured in real-time motion. These images will then be recorded.  You may need an IV to receive a medicine that improves the quality of the pictures. What happens after the procedure? You may return to your normal schedule including diet, activities, and medicines, unless your health care provider tells you otherwise. This information is not intended to replace  advice given to you by your health care provider. Make sure you discuss any questions you have with your health care provider. Document Released: 01/14/2000 Document Revised: 09/04/2015 Document Reviewed: 09/23/2012 Elsevier Interactive Patient Education  2017 [employer].

## 2017-11-15 ENCOUNTER — Other Ambulatory Visit: Payer: Self-pay

## 2017-11-15 ENCOUNTER — Ambulatory Visit (INDEPENDENT_AMBULATORY_CARE_PROVIDER_SITE_OTHER): Payer: BLUE CROSS/BLUE SHIELD

## 2017-11-15 DIAGNOSIS — R072 Precordial pain: Secondary | ICD-10-CM

## 2017-11-28 ENCOUNTER — Other Ambulatory Visit: Payer: Self-pay | Admitting: Urgent Care

## 2017-12-05 ENCOUNTER — Other Ambulatory Visit: Payer: Self-pay | Admitting: Urgent Care

## 2017-12-07 ENCOUNTER — Inpatient Hospital Stay: Payer: BLUE CROSS/BLUE SHIELD | Admitting: Hematology and Oncology

## 2017-12-07 ENCOUNTER — Inpatient Hospital Stay: Payer: BLUE CROSS/BLUE SHIELD

## 2017-12-14 ENCOUNTER — Inpatient Hospital Stay: Payer: BLUE CROSS/BLUE SHIELD | Attending: Hematology and Oncology

## 2017-12-14 ENCOUNTER — Inpatient Hospital Stay (HOSPITAL_BASED_OUTPATIENT_CLINIC_OR_DEPARTMENT_OTHER): Payer: BLUE CROSS/BLUE SHIELD | Admitting: Hematology and Oncology

## 2017-12-14 ENCOUNTER — Encounter: Payer: Self-pay | Admitting: Hematology and Oncology

## 2017-12-14 ENCOUNTER — Other Ambulatory Visit: Payer: Self-pay

## 2017-12-14 VITALS — BP 143/76 | HR 97 | Temp 97.7°F | Resp 18 | Wt 156.5 lb

## 2017-12-14 DIAGNOSIS — Z803 Family history of malignant neoplasm of breast: Secondary | ICD-10-CM | POA: Diagnosis not present

## 2017-12-14 DIAGNOSIS — K219 Gastro-esophageal reflux disease without esophagitis: Secondary | ICD-10-CM | POA: Diagnosis not present

## 2017-12-14 DIAGNOSIS — I2542 Coronary artery dissection: Secondary | ICD-10-CM

## 2017-12-14 DIAGNOSIS — M8580 Other specified disorders of bone density and structure, unspecified site: Secondary | ICD-10-CM

## 2017-12-14 DIAGNOSIS — Z79899 Other long term (current) drug therapy: Secondary | ICD-10-CM | POA: Diagnosis not present

## 2017-12-14 DIAGNOSIS — Z79811 Long term (current) use of aromatase inhibitors: Secondary | ICD-10-CM | POA: Insufficient documentation

## 2017-12-14 DIAGNOSIS — Z7982 Long term (current) use of aspirin: Secondary | ICD-10-CM

## 2017-12-14 DIAGNOSIS — C50412 Malignant neoplasm of upper-outer quadrant of left female breast: Secondary | ICD-10-CM

## 2017-12-14 DIAGNOSIS — I252 Old myocardial infarction: Secondary | ICD-10-CM | POA: Diagnosis not present

## 2017-12-14 DIAGNOSIS — I10 Essential (primary) hypertension: Secondary | ICD-10-CM | POA: Diagnosis not present

## 2017-12-14 DIAGNOSIS — Z170 Estrogen receptor positive status [ER+]: Secondary | ICD-10-CM | POA: Diagnosis not present

## 2017-12-14 DIAGNOSIS — K449 Diaphragmatic hernia without obstruction or gangrene: Secondary | ICD-10-CM | POA: Insufficient documentation

## 2017-12-14 LAB — CBC WITH DIFFERENTIAL/PLATELET
ABS IMMATURE GRANULOCYTES: 0.03 10*3/uL (ref 0.00–0.07)
BASOS ABS: 0 10*3/uL (ref 0.0–0.1)
Basophils Relative: 1 %
EOS ABS: 0.1 10*3/uL (ref 0.0–0.5)
Eosinophils Relative: 2 %
HEMATOCRIT: 38 % (ref 36.0–46.0)
HEMOGLOBIN: 12.4 g/dL (ref 12.0–15.0)
IMMATURE GRANULOCYTES: 1 %
LYMPHS ABS: 1.1 10*3/uL (ref 0.7–4.0)
LYMPHS PCT: 20 %
MCH: 30.3 pg (ref 26.0–34.0)
MCHC: 32.6 g/dL (ref 30.0–36.0)
MCV: 92.9 fL (ref 80.0–100.0)
MONOS PCT: 8 %
Monocytes Absolute: 0.5 10*3/uL (ref 0.1–1.0)
NEUTROS ABS: 3.8 10*3/uL (ref 1.7–7.7)
NEUTROS PCT: 68 %
NRBC: 0 % (ref 0.0–0.2)
Platelets: 218 10*3/uL (ref 150–400)
RBC: 4.09 MIL/uL (ref 3.87–5.11)
RDW: 12.5 % (ref 11.5–15.5)
WBC: 5.6 10*3/uL (ref 4.0–10.5)

## 2017-12-14 LAB — COMPREHENSIVE METABOLIC PANEL
ALT: 27 U/L (ref 0–44)
AST: 23 U/L (ref 15–41)
Albumin: 4 g/dL (ref 3.5–5.0)
Alkaline Phosphatase: 85 U/L (ref 38–126)
Anion gap: 8 (ref 5–15)
BUN: 17 mg/dL (ref 8–23)
CO2: 28 mmol/L (ref 22–32)
Calcium: 9.4 mg/dL (ref 8.9–10.3)
Chloride: 106 mmol/L (ref 98–111)
Creatinine, Ser: 0.88 mg/dL (ref 0.44–1.00)
GFR calc Af Amer: >60 mL/min (ref >60)
GFR calc non Af Amer: >60 mL/min (ref >60)
Glucose, Bld: 107 mg/dL — ABNORMAL HIGH (ref 70–99)
Potassium: 4.3 mmol/L (ref 3.5–5.1)
Sodium: 142 mmol/L (ref 135–145)
Total Bilirubin: 0.7 mg/dL (ref 0.3–1.2)
Total Protein: 7.6 g/dL (ref 6.5–8.1)

## 2017-12-14 MED ORDER — LETROZOLE 2.5 MG PO TABS: 2.5000 mg | ORAL_TABLET | Freq: Every day | ORAL | 3 refills | Status: DC
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
## 2017-12-14 NOTE — Progress Notes (Signed)
Here for follow up. Per pt " doing good ,no c/o " she stated

## 2017-12-19 ENCOUNTER — Ambulatory Visit (INDEPENDENT_AMBULATORY_CARE_PROVIDER_SITE_OTHER): Payer: BLUE CROSS/BLUE SHIELD | Admitting: Nurse Practitioner

## 2017-12-19 ENCOUNTER — Encounter: Payer: Self-pay | Admitting: Nurse Practitioner

## 2017-12-19 VITALS — BP 100/70 | HR 70 | Ht 63.0 in | Wt 155.8 lb

## 2017-12-19 DIAGNOSIS — I2542 Coronary artery dissection: Secondary | ICD-10-CM | POA: Diagnosis not present

## 2017-12-19 DIAGNOSIS — I10 Essential (primary) hypertension: Secondary | ICD-10-CM | POA: Diagnosis not present

## 2017-12-19 DIAGNOSIS — E785 Hyperlipidemia, unspecified: Secondary | ICD-10-CM
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
## 2017-12-19 NOTE — Patient Instructions (Signed)
Medication Instructions:  No medication changes today  Your physician recommends that you continue on your current medications as directed. Please refer to the Current Medication list given to you today.  If you need a refill on your cardiac medications before your next appointment, please call your pharmacy.   Lab work: None ordered   If you have labs (blood work) drawn today and your tests are completely normal, you will receive your results only by: Marland Kitchen MyChart Message (if you have MyChart) OR . A paper copy in the mail If you have any lab test that is abnormal or we need to change your treatment, we will call you to review the results.  Testing/Procedures: None ordered   Follow-Up: At [facility], you and your health needs are our priority.  As part of our continuing mission to provide you with exceptional heart care, we have created designated Provider Care Teams.  These Care Teams include your primary Cardiologist (physician) and Advanced Practice Providers (APPs -  Physician Assistants and Nurse Practitioners) who all work together to provide you with the care you need, when you need it. You will need a follow up appointment in 3 months. You may see Ida Rogue, MD or Murray Hodgkins, NP

## 2018-01-30 HISTORY — PX: BREAST BIOPSY: SHX20

## 2018-03-08 ENCOUNTER — Other Ambulatory Visit: Payer: Self-pay

## 2018-03-08 DIAGNOSIS — C50412 Malignant neoplasm of upper-outer quadrant of left female breast: Secondary | ICD-10-CM

## 2018-03-08 DIAGNOSIS — Z170 Estrogen receptor positive status [ER+]: Principal | ICD-10-CM

## 2018-03-18 ENCOUNTER — Ambulatory Visit: Payer: BLUE CROSS/BLUE SHIELD | Admitting: Hematology and Oncology

## 2018-03-18 ENCOUNTER — Inpatient Hospital Stay: Payer: BLUE CROSS/BLUE SHIELD | Attending: Oncology

## 2018-03-18 ENCOUNTER — Inpatient Hospital Stay: Payer: BLUE CROSS/BLUE SHIELD | Attending: Hematology and Oncology | Admitting: Oncology

## 2018-03-18 ENCOUNTER — Other Ambulatory Visit: Payer: BLUE CROSS/BLUE SHIELD

## 2018-03-18 ENCOUNTER — Other Ambulatory Visit: Payer: Self-pay

## 2018-03-18 VITALS — BP 133/85 | HR 81 | Temp 98.0°F | Resp 18 | Wt 159.8 lb

## 2018-03-18 DIAGNOSIS — Z923 Personal history of irradiation: Secondary | ICD-10-CM | POA: Insufficient documentation

## 2018-03-18 DIAGNOSIS — C50412 Malignant neoplasm of upper-outer quadrant of left female breast: Secondary | ICD-10-CM | POA: Diagnosis not present

## 2018-03-18 DIAGNOSIS — Z79899 Other long term (current) drug therapy: Secondary | ICD-10-CM | POA: Diagnosis not present

## 2018-03-18 DIAGNOSIS — I10 Essential (primary) hypertension: Secondary | ICD-10-CM | POA: Diagnosis not present

## 2018-03-18 DIAGNOSIS — Z800 Family history of malignant neoplasm of digestive organs: Secondary | ICD-10-CM | POA: Insufficient documentation

## 2018-03-18 DIAGNOSIS — Z803 Family history of malignant neoplasm of breast: Secondary | ICD-10-CM | POA: Insufficient documentation

## 2018-03-18 DIAGNOSIS — Z79811 Long term (current) use of aromatase inhibitors: Secondary | ICD-10-CM | POA: Insufficient documentation

## 2018-03-18 DIAGNOSIS — Z7982 Long term (current) use of aspirin: Secondary | ICD-10-CM | POA: Insufficient documentation

## 2018-03-18 DIAGNOSIS — Z8041 Family history of malignant neoplasm of ovary: Secondary | ICD-10-CM | POA: Insufficient documentation

## 2018-03-18 DIAGNOSIS — Z170 Estrogen receptor positive status [ER+]: Secondary | ICD-10-CM | POA: Diagnosis not present

## 2018-03-18 DIAGNOSIS — Z08 Encounter for follow-up examination after completed treatment for malignant neoplasm: Secondary | ICD-10-CM

## 2018-03-18 DIAGNOSIS — I252 Old myocardial infarction: Secondary | ICD-10-CM | POA: Insufficient documentation

## 2018-03-18 DIAGNOSIS — Z853 Personal history of malignant neoplasm of breast: Secondary | ICD-10-CM

## 2018-03-18 LAB — COMPREHENSIVE METABOLIC PANEL
ALT: 27 U/L (ref 0–44)
AST: 23 U/L (ref 15–41)
Albumin: 4.3 g/dL (ref 3.5–5.0)
Alkaline Phosphatase: 76 U/L (ref 38–126)
Anion gap: 7 (ref 5–15)
BUN: 14 mg/dL (ref 8–23)
CO2: 24 mmol/L (ref 22–32)
Calcium: 9.1 mg/dL (ref 8.9–10.3)
Chloride: 110 mmol/L (ref 98–111)
Creatinine, Ser: 0.71 mg/dL (ref 0.44–1.00)
GFR calc Af Amer: >60 mL/min (ref >60)
GFR calc non Af Amer: >60 mL/min (ref >60)
Glucose, Bld: 140 mg/dL — ABNORMAL HIGH (ref 70–99)
Potassium: 3.9 mmol/L (ref 3.5–5.1)
Sodium: 141 mmol/L (ref 135–145)
Total Bilirubin: 0.8 mg/dL (ref 0.3–1.2)
Total Protein: 7.8 g/dL (ref 6.5–8.1)

## 2018-03-18 LAB — CBC WITH DIFFERENTIAL/PLATELET
Abs Immature Granulocytes: 0.02 10*3/uL (ref 0.00–0.07)
Basophils Absolute: 0 10*3/uL (ref 0.0–0.1)
Basophils Relative: 1 %
Eosinophils Absolute: 0.1 10*3/uL (ref 0.0–0.5)
Eosinophils Relative: 2 %
HCT: 38.9 % (ref 36.0–46.0)
Hemoglobin: 12.9 g/dL (ref 12.0–15.0)
Immature Granulocytes: 0 %
Lymphocytes Relative: 24 %
Lymphs Abs: 1.3 10*3/uL (ref 0.7–4.0)
MCH: 30.6 pg (ref 26.0–34.0)
MCHC: 33.2 g/dL (ref 30.0–36.0)
MCV: 92.2 fL (ref 80.0–100.0)
Monocytes Absolute: 0.4 10*3/uL (ref 0.1–1.0)
Monocytes Relative: 7 %
Neutro Abs: 3.7 10*3/uL (ref 1.7–7.7)
Neutrophils Relative %: 66 %
Platelets: 247 10*3/uL (ref 150–400)
RBC: 4.22 MIL/uL (ref 3.87–5.11)
RDW: 13.7 % (ref 11.5–15.5)
WBC: 5.6 10*3/uL (ref 4.0–10.5)
nRBC: 0 % (ref 0.0–0.2)

## 2018-03-18 NOTE — Progress Notes (Signed)
here for new pt to Dr Janese Banks appt.  ( past pt of Dr Drenda Freeze ) Over per pt " I feel well "

## 2018-03-19 ENCOUNTER — Encounter: Payer: Self-pay | Admitting: Oncology

## 2018-03-19 LAB — CANCER ANTIGEN 27.29: CA 27.29: 22.4 U/mL (ref 0.0–38.6)
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
## 2018-03-20 NOTE — Progress Notes (Signed)
Cardiology Office Note  Date:  03/21/2018   ID:  Carigan, Lister 1955/09/03, MRN [id number]  PCP:  Maryland Pink, MD   Chief Complaint  Patient presents with  . other    3 month follow up. Meds reviewed by the pt. verbally. "doing well."     HPI:  63 year old woman with  Past medical history of SVT  hypertension  lumpectomy for breast cancer,  substernal chest pain, diaphoresis sweating , troponins up to 1.5 CTA chest was performed and was neg for PE.  Cardiac cath with suspected coronary dissection of the mid to distal LAD with severe stenosis/tapering mid LAD to apical region Severe apical and periapical akinesis. Mildly depressed EF, 45 to 50% Who presents for follow-up of her coronary artery disease recent dissection  Stress in family, Lost family member, dementia  She has continued her regular exercise Finished cardiac rehabilitation Doing  "CARE" program through oncology Conditioning has improved compared to prior clinic visit [place]  Denies any significant chest pain or shortness of breath on exertion  Prior catheterization results discussed Does not have the sensation there are new active ischemic issues Tolerating medications  EKG personally reviewed by myself on todays visit Shows normal sinus rhythm with rate 67 bpm no significant ST or T wave changes  Records from the hospital [facility] 06/22/17:   Mid LAD to Dist LAD lesion is 80% stenosed.  The left ventricular ejection fraction is 45-50% by visual estimate.  LV end diastolic pressure is normal.  There is mild left ventricular systolic dysfunction.  There is no mitral valve regurgitation.   Echo 06/22/17: Study Conclusions   - Left ventricle: The cavity size was normal. Systolic function wasnormal. The estimated ejection fraction was in the range of 50% to 55%. Select images with at least moderate hypokinesis in theapical, periapical and apical septal region Doppler parameters are  consistent with abnormal left ventricular relaxation (grade 1 diastolic dysfunction). - Left atrium: The atrium was normal in size. - Right ventricle: Systolic function was normal. - Pulmonary arteries: Systolic pressure was within the normal range.  PMH:   has a past medical history of Atypical chest pain, Breast cancer of upper-outer quadrant of left female breast ([place]) (05/30/2017), Colon polyps, Complication of anesthesia, Concussion (1761), Diastolic dysfunction, Family history of adverse reaction to anesthesia, GERD (gastroesophageal reflux disease), History of hiatal hernia, Hypertension, Migraine, Osteopenia, PONV (postoperative nausea and vomiting), Shingles, Spontaneous dissection of coronary artery (06/21/2017), SVT (supraventricular tachycardia) ([place]), VAIN I (vaginal intraepithelial neoplasia grade I), and Varicose veins.  PSH:    Past Surgical History:  Procedure Laterality Date  . BREAST BIOPSY Left 05/01/2017    x 2 areas.  2:00 6cmfn irregular mass wing clip.  2:00 6cmfn round mass venus clip. DUCTAL CARCINOMA IN SITU and INVASIVE MAMMARY CARCINOMA  . BREAST BIOPSY Left 05/10/2017   affirm bx with  X marker  PSEUDO-ANGIOMATOUS STROMAL HYPERPLASIA /Upper outer Quad  . BREAST LUMPECTOMY Left 05/30/2017   lumpectomy of wing and venus markers, invasive mammary carcinoma and DCIS  . BREAST LUMPECTOMY WITH SENTINEL LYMPH NODE BIOPSY Left 05/30/2017   Procedure: BREAST LUMPECTOMY WITH SENTINEL LYMPH NODE BX,NEEDLE LOCALIZATION;  Surgeon: Robert Bellow, MD;  Location: [facility] ORS;  Service: General;  Laterality: Left;  . cervical cryosurgery  1980  . COLONOSCOPY  2011  . DILATION AND CURETTAGE OF UTERUS     x 2  . LEFT HEART CATH AND CORONARY ANGIOGRAPHY N/A 06/22/2017   Procedure: LEFT  HEART CATH AND CORONARY ANGIOGRAPHY;  Surgeon: Minna Merritts, MD;  Location: [place] [facility];  Service: Cardiovascular;  Laterality: N/A;  . TUBAL LIGATION    . VAGINAL HYSTERECTOMY     tvh     Current Outpatient Medications  Medication Sig Dispense Refill  . acetaminophen (TYLENOL) 500 MG tablet Take 1,000 mg by mouth every 6 (six) hours as needed (for pain/headaches.).    Marland Kitchen aspirin 81 MG EC tablet Take 1 tablet (81 mg total) by mouth daily. 30 tablet 0  . atorvastatin (LIPITOR) 80 MG tablet Take 1 tablet (80 mg total) by mouth daily at 6 PM. 30 tablet 0  . calcium-vitamin D (OSCAL WITH D) 500-200 MG-UNIT tablet Take 1 tablet by mouth.    . letrozole (FEMARA) 2.5 MG tablet Take 1 tablet (2.5 mg total) by mouth daily. 90 tablet 3  . metoprolol tartrate (LOPRESSOR) 100 MG tablet Take 1 tablet (100 mg total) by mouth 2 (two) times daily. 180 tablet 2  . Multiple Vitamin (MULTIVITAMIN WITH MINERALS) TABS tablet Take 1 tablet by mouth daily.    . nitroGLYCERIN (NITROSTAT) 0.4 MG SL tablet Place 1 tablet (0.4 mg total) under the tongue every 5 (five) minutes as needed for chest pain. 25 tablet 3  . pantoprazole (PROTONIX) 40 MG tablet Take 40 mg by mouth daily.  5  . vitamin C (ASCORBIC ACID) 500 MG tablet Take 500 mg by mouth daily.     No current facility-administered medications for this visit.      Allergies:   Patient has no known allergies.   Social History:  The patient  reports that she has never smoked. She has never used smokeless tobacco. She reports that she does not drink alcohol or use drugs.   Family History:   family history includes Anxiety disorder in her brother; Breast cancer (age of onset: 55) in her paternal aunt; Breast cancer (age of onset: 21) in her mother; Cancer in her paternal aunt; Diabetes in her father; Esophageal cancer (age of onset: 22) in her father; Hypertension in her brother, brother, and father; Stomach cancer in her paternal grandfather; Thyroid disease in her father.    Review of Systems: Review of Systems  Constitutional: Negative.   Respiratory: Negative.   Cardiovascular: Negative.   Gastrointestinal: Negative.   Musculoskeletal:  Negative.   Neurological: Negative.   Psychiatric/Behavioral: Negative.   All other systems reviewed and are negative.   PHYSICAL EXAM: VS:  BP 110/80 (BP Location: Left Arm, Patient Position: Sitting, Cuff Size: Normal)   Pulse 67   Ht 5\' 3"  (1.6 m)   Wt 161 lb 12 oz (73.4 kg)   BMI 28.65 kg/m  , BMI Body mass index is 28.65 kg/m. Constitutional:  oriented to person, place, and time. No distress.  HENT:  Head: Grossly normal Eyes:  no discharge. No scleral icterus.  Neck: No JVD, no carotid bruits  Cardiovascular: Regular rate and rhythm, no murmurs appreciated Pulmonary/Chest: Clear to auscultation bilaterally, no wheezes or rails Abdominal: Soft.  no distension.  no tenderness.  Musculoskeletal: Normal range of motion Neurological:  normal muscle tone. Coordination normal. No atrophy Skin: Skin warm and dry Psychiatric: normal affect, pleasant   Recent Labs: 06/22/2017: TSH 1.270 03/18/2018: ALT 27; BUN 14; Creatinine, Ser 0.71; Hemoglobin 12.9; Platelets 247; Potassium 3.9; Sodium 141    Lipid Panel Lab Results  Component Value Date   CHOL 114 10/09/2017   HDL 39 (L) 10/09/2017   LDLCALC 52 10/09/2017  TRIG 117 10/09/2017      Wt Readings from Last 3 Encounters:  03/21/18 161 lb 12 oz (73.4 kg)  03/18/18 159 lb 12.8 oz (72.5 kg)  12/19/17 155 lb 12 oz (70.6 kg)      ASSESSMENT AND PLAN:  NSTEMI (non-ST elevated myocardial infarction) ([place]) - Plan: EKG 12-Lead  spontaneous coronary dissection  echocardiogram with minimally depressed ejection fraction and region of hypokinesis in the mid to distal LAD territory She is on aspirin daily, metoprolol statin  Essential (primary) hypertension - Plan: EKG 12-Lead Long discussion concerning optimal medical management Blood pressure running low, recommended she could cut the metoprolol tartrate back to 50 twice daily Not sure if she wants to do this as she feels well Also discussed possibly changing to metoprolol  succinate rather than tartrate Currently does not want to make any changes Recommend she call if she changes her mind There was an issue with more SVT with low-dose metoprolol in the past  Supraventricular tachycardia (HCC) No recent episodes of SVT Tolerating metoprolol tartrate 100 twice daily but blood pressure running low Previously increased from 50 twice daily while she was in the hospital for narrow complex tachycardia Denies fatigue  Breast cancer Completed radiation treatment On maintenance medication  Coronary artery dissection Details as above  Disposition:   F/U  12 months   Total encounter time more than 25 minutes  Greater than 50% was spent in counseling and coordination of care with the patient    Orders Placed This Encounter  Procedures  . EKG 12-Lead     Signed, Esmond Plants, M.D., Ph.D. 03/21/2018  [employer], [place]

## 2018-03-21 ENCOUNTER — Ambulatory Visit: Payer: BLUE CROSS/BLUE SHIELD | Admitting: Cardiovascular Disease

## 2018-03-21 ENCOUNTER — Encounter: Payer: Self-pay | Admitting: Cardiovascular Disease

## 2018-03-21 VITALS — BP 110/80 | HR 67 | Ht 63.0 in | Wt 161.8 lb

## 2018-03-21 DIAGNOSIS — I214 Non-ST elevation (NSTEMI) myocardial infarction: Secondary | ICD-10-CM | POA: Diagnosis not present

## 2018-03-21 DIAGNOSIS — I10 Essential (primary) hypertension: Secondary | ICD-10-CM

## 2018-03-21 DIAGNOSIS — I471 Supraventricular tachycardia: Secondary | ICD-10-CM | POA: Diagnosis not present

## 2018-03-21 DIAGNOSIS — E782 Mixed hyperlipidemia: Secondary | ICD-10-CM | POA: Insufficient documentation

## 2018-03-21 NOTE — Patient Instructions (Addendum)
Medication Instructions:  Your physician has recommended you make the following change in your medication:  1. DECREASE Metoprolol 100 mg to 1/2 tablet (50 mg) twice a day  Call if you would like the metoprolol succinate (once a day pill)  If you need a refill on your cardiac medications before your next appointment, please call your pharmacy.    Lab work: No new labs needed   If you have labs (blood work) drawn today and your tests are completely normal, you will receive your results only by: Marland Kitchen MyChart Message (if you have MyChart) OR . A paper copy in the mail If you have any lab test that is abnormal or we need to change your treatment, we will call you to review the results.   Testing/Procedures: No new testing needed   Follow-Up: At [facility], you and your health needs are our priority.  As part of our continuing mission to provide you with exceptional heart care, we have created designated Provider Care Teams.  These Care Teams include your primary Cardiologist (physician) and Advanced Practice Providers (APPs -  Physician Assistants and Nurse Practitioners) who all work together to provide you with the care you need, when you need it.  . You will need a follow up appointment in 12 months .   Please call our office 2 months in advance to schedule this appointment.    . Providers on your designated Care Team:   . Murray Hodgkins, NP . Christell Faith, PA-C . Marrianne Mood, PA-C  Any Other Special Instructions Will Be Listed Below (If Applicable).  For educational health videos Log in to : [email or web address] Or : [email or web address], password : [email or web address]

## 2018-03-29 ENCOUNTER — Other Ambulatory Visit: Payer: Self-pay

## 2018-03-29 ENCOUNTER — Ambulatory Visit
Admission: RE | Admit: 2018-03-29 | Discharge: 2018-03-29 | Disposition: A | Discharge status: Home / Self Care | Payer: BLUE CROSS/BLUE SHIELD | Source: Ambulatory Visit | Attending: Radiation Oncology | Admitting: Radiation Oncology

## 2018-03-29 ENCOUNTER — Encounter: Payer: Self-pay | Admitting: Radiation Oncology

## 2018-03-29 VITALS — BP 129/65 | HR 61 | Temp 96.9°F | Resp 18 | Wt 158.8 lb

## 2018-03-29 DIAGNOSIS — Z79811 Long term (current) use of aromatase inhibitors: Secondary | ICD-10-CM | POA: Diagnosis not present

## 2018-03-29 DIAGNOSIS — Z923 Personal history of irradiation: Secondary | ICD-10-CM | POA: Diagnosis not present

## 2018-03-29 DIAGNOSIS — Z170 Estrogen receptor positive status [ER+]: Secondary | ICD-10-CM | POA: Diagnosis not present

## 2018-03-29 DIAGNOSIS — C50412 Malignant neoplasm of upper-outer quadrant of left female breast: Secondary | ICD-10-CM | POA: Insufficient documentation

## 2018-03-29 NOTE — Progress Notes (Signed)
Radiation Oncology Follow up Note  Name: Maria Barnes   Date:   03/29/2018 MRN:  [id number] DOB: [birth year]    This 63 y.o. female presents to the clinic today for 6 month follow-up status post whole breast radiation to her left breast for stage I invasive mammary carcinoma ER/PR positive.  REFERRING PROVIDER: Maryland Pink, MD  HPI: patient is a 63 year old female now out 6 months having whole breast radiation to her left breast for ER/PR positive stage I invasive mammary carcinoma. Seen today in routine follow-up she is doing well. She specifically denies breast tenderness cough or bone pain. Has not yet had a follow-up mammogram. She is currently on.Femara tolerate that well without side effect.  COMPLICATIONS OF TREATMENT: none  FOLLOW UP COMPLIANCE: keeps appointments   PHYSICAL EXAM:  BP 129/65   Pulse 61   Temp (!) 96.9 F (36.1 C)   Resp 18   Wt 158 lb 13.5 oz (72 kg)   BMI 28.14 kg/m  Lungs are clear to A&P cardiac examination essentially unremarkable with regular rate and rhythm. No dominant mass or nodularity is noted in either breast in 2 positions examined. Incision is well-healed. No axillary or supraclavicular adenopathy is appreciated. Cosmetic result is excellent.she does have a small area of nodularity in her medial portion of her lumpectomy scar of the left breast will continue to observe this. Do not think it is malignant.Well-developed well-nourished patient in NAD. HEENT reveals PERLA, EOMI, discs not visualized.  Oral cavity is clear. No oral mucosal lesions are identified. Neck is clear without evidence of cervical or supraclavicular adenopathy. Lungs are clear to A&P. Cardiac examination is essentially unremarkable with regular rate and rhythm without murmur rub or thrill. Abdomen is benign with no organomegaly or masses noted. Motor sensory and DTR levels are equal and symmetric in the upper and lower extremities. Cranial nerves II through XII are grossly intact.  Proprioception is intact. No peripheral adenopathy or edema is identified. No motor or sensory levels are noted. Crude visual fields are within normal range.  RADIOLOGY RESULTS: no current films for review  PLAN: resent time patient is doing well 6 months out from whole breast radiation I'm please were overall progress. We'll continue to follow small nodule her scar which may be a suture. Otherwise I'm please were overall progress I've asked to see her back in 6 months for follow-up and will start once your follow-up appointments. Patient continues on Femara without side effect. Patient knows to call with any concerns.  I would like to take this opportunity to thank you for allowing me to participate in the care of your patient.Noreene Filbert, MD

## 2018-04-16 ENCOUNTER — Ambulatory Visit
Admission: RE | Admit: 2018-04-16 | Discharge: 2018-04-16 | Disposition: A | Discharge status: Home / Self Care | Payer: BLUE CROSS/BLUE SHIELD | Source: Ambulatory Visit | Attending: General Surgery | Admitting: General Surgery

## 2018-04-16 ENCOUNTER — Other Ambulatory Visit: Payer: Self-pay

## 2018-04-16 DIAGNOSIS — Z170 Estrogen receptor positive status [ER+]: Principal | ICD-10-CM

## 2018-04-16 DIAGNOSIS — C50412 Malignant neoplasm of upper-outer quadrant of left female breast: Secondary | ICD-10-CM

## 2018-04-16 DIAGNOSIS — Z853 Personal history of malignant neoplasm of breast: Secondary | ICD-10-CM | POA: Diagnosis not present

## 2018-04-16 DIAGNOSIS — N6323 Unspecified lump in the left breast, lower outer quadrant: Secondary | ICD-10-CM | POA: Diagnosis not present

## 2018-04-16 DIAGNOSIS — N6321 Unspecified lump in the left breast, upper outer quadrant: Secondary | ICD-10-CM | POA: Diagnosis not present

## 2018-04-16 DIAGNOSIS — R922 Inconclusive mammogram: Secondary | ICD-10-CM | POA: Diagnosis not present

## 2018-04-16 HISTORY — DX: Personal history of irradiation: Z92.3

## 2018-04-23 ENCOUNTER — Ambulatory Visit (INDEPENDENT_AMBULATORY_CARE_PROVIDER_SITE_OTHER): Payer: BLUE CROSS/BLUE SHIELD | Admitting: General Surgery

## 2018-04-23 ENCOUNTER — Other Ambulatory Visit: Payer: Self-pay

## 2018-04-23 ENCOUNTER — Ambulatory Visit: Payer: Self-pay

## 2018-04-23 ENCOUNTER — Encounter: Payer: Self-pay | Admitting: General Surgery

## 2018-04-23 VITALS — BP 142/82 | HR 80 | Temp 98.1°F | Resp 12 | Ht 63.0 in | Wt 160.0 lb

## 2018-04-23 DIAGNOSIS — N6321 Unspecified lump in the left breast, upper outer quadrant: Secondary | ICD-10-CM | POA: Diagnosis not present
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
## 2018-04-23 NOTE — Patient Instructions (Signed)
May shower May remove dressing in 2-3 days Steri strips will gradually come off over 2-3 weeks May use an Ice pack as needed for comfort     CARE AFTER BREAST BIOPSY  1. Leave the dressing on that your doctor applied after the biopsy. It is waterproof. You may bathe, shower and/or swim. The dressing can be removed in 3 days, you will see small strips of tape against your skin on the incision. Do not remove these strips they will gradually fall off in about 2-3 weeks. You may use an ice pack on and off for the first 12-24 hours for comfort.  2. You may want to use a gauze,cloth or similar protection in your bra to prevent rubbing against your dressing and incision. This is not necessary, but you may feel more comfortable doing so.  3. It is recommended that you wear a bra day and night to give support to the breast. This will prevent the weight of the breast from pulling on the incision.  4. Your breast may feel hard and lumpy under the incision. Do not be alarmed. This is the underlying stitching of tissue. Softening of this tissue will occur in time.  5. You may have a follow up appointment or phone follow up in one week after your biopsy. The office phone number is [phone].  6. You will notice about a week or two after your office visit that the strips of the tape on your incision will begin to loosen. These may then be removed.  7. Report to your doctor any of the following:  * Severe pain not relieved by your pain medication  *Redness of the incision  * Drainage from the incision  *Fever greater than 101 degrees

## 2018-04-24 ENCOUNTER — Telehealth: Payer: Self-pay | Admitting: General Surgery

## 2018-04-24 NOTE — Telephone Encounter (Signed)
Patient notified yesterday's results showed fat necrosis.  Correlates with the operative field and edge of radiation "boost".  She reports minimal discomfort.  Will plan for a f/u exam and mammogram in one year.

## 2018-06-17 ENCOUNTER — Ambulatory Visit: Payer: BLUE CROSS/BLUE SHIELD | Admitting: Oncology

## 2018-07-18 ENCOUNTER — Ambulatory Visit: Payer: BLUE CROSS/BLUE SHIELD | Admitting: Oncology

## 2018-07-24 ENCOUNTER — Other Ambulatory Visit: Payer: Self-pay

## 2018-07-25 ENCOUNTER — Inpatient Hospital Stay: Payer: BLUE CROSS/BLUE SHIELD | Attending: Oncology | Admitting: Oncology

## 2018-07-25 ENCOUNTER — Other Ambulatory Visit: Payer: Self-pay

## 2018-07-25 VITALS — BP 132/83 | HR 92 | Temp 97.9°F | Resp 20 | Ht 63.0 in | Wt 164.3 lb

## 2018-07-25 DIAGNOSIS — Z79811 Long term (current) use of aromatase inhibitors: Secondary | ICD-10-CM | POA: Diagnosis not present

## 2018-07-25 DIAGNOSIS — C50412 Malignant neoplasm of upper-outer quadrant of left female breast: Secondary | ICD-10-CM

## 2018-07-25 DIAGNOSIS — Z923 Personal history of irradiation: Secondary | ICD-10-CM | POA: Diagnosis not present

## 2018-07-25 DIAGNOSIS — Z853 Personal history of malignant neoplasm of breast: Secondary | ICD-10-CM

## 2018-07-25 DIAGNOSIS — Z170 Estrogen receptor positive status [ER+]: Secondary | ICD-10-CM | POA: Diagnosis not present

## 2018-07-25 DIAGNOSIS — Z08 Encounter for follow-up examination after completed treatment for malignant neoplasm: Secondary | ICD-10-CM

## 2018-07-26 ENCOUNTER — Ambulatory Visit: Payer: BLUE CROSS/BLUE SHIELD | Admitting: Oncology

## 2018-07-28 ENCOUNTER — Encounter: Payer: Self-pay | Admitting: Oncology
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
## 2018-08-17 ENCOUNTER — Encounter: Payer: Self-pay | Admitting: *Deleted

## 2018-08-29 ENCOUNTER — Encounter: Payer: Self-pay | Admitting: General Surgery

## 2018-09-24 ENCOUNTER — Encounter: Payer: BLUE CROSS/BLUE SHIELD | Admitting: Obstetrics and Gynecology

## 2018-09-25 ENCOUNTER — Encounter: Payer: Self-pay | Admitting: Obstetrics and Gynecology

## 2018-09-25 ENCOUNTER — Ambulatory Visit (INDEPENDENT_AMBULATORY_CARE_PROVIDER_SITE_OTHER): Payer: BLUE CROSS/BLUE SHIELD | Admitting: Obstetrics and Gynecology

## 2018-09-25 ENCOUNTER — Other Ambulatory Visit: Payer: Self-pay

## 2018-09-25 VITALS — BP 121/78 | HR 78 | Ht 63.0 in | Wt 160.9 lb

## 2018-09-25 DIAGNOSIS — Z01419 Encounter for gynecological examination (general) (routine) without abnormal findings: Secondary | ICD-10-CM | POA: Diagnosis not present

## 2018-09-25 DIAGNOSIS — Z6828 Body mass index (BMI) 28.0-28.9, adult: Secondary | ICD-10-CM

## 2018-09-25 DIAGNOSIS — Z90710 Acquired absence of both cervix and uterus: Secondary | ICD-10-CM
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
## 2018-09-25 NOTE — Patient Instructions (Signed)
L-Theanine 200mg  as needed for anxiety/relaxation.

## 2018-10-03 ENCOUNTER — Ambulatory Visit: Payer: BLUE CROSS/BLUE SHIELD | Admitting: Radiation Oncology

## 2018-10-31 ENCOUNTER — Ambulatory Visit
Admission: RE | Admit: 2018-10-31 | Discharge: 2018-10-31 | Disposition: A | Discharge status: Home / Self Care | Payer: BLUE CROSS/BLUE SHIELD | Source: Ambulatory Visit | Attending: Radiation Oncology | Admitting: Radiation Oncology

## 2018-10-31 ENCOUNTER — Other Ambulatory Visit: Payer: Self-pay

## 2018-10-31 ENCOUNTER — Encounter: Payer: Self-pay | Admitting: Radiation Oncology

## 2018-10-31 VITALS — BP 155/78 | HR 67 | Temp 98.4°F | Resp 16 | Wt 159.8 lb

## 2018-10-31 DIAGNOSIS — Z170 Estrogen receptor positive status [ER+]: Secondary | ICD-10-CM | POA: Diagnosis not present

## 2018-10-31 DIAGNOSIS — C50412 Malignant neoplasm of upper-outer quadrant of left female breast: Secondary | ICD-10-CM | POA: Diagnosis not present

## 2018-10-31 DIAGNOSIS — Z79811 Long term (current) use of aromatase inhibitors: Secondary | ICD-10-CM | POA: Insufficient documentation

## 2018-10-31 DIAGNOSIS — Z923 Personal history of irradiation: Secondary | ICD-10-CM | POA: Diagnosis not present

## 2018-10-31 NOTE — Progress Notes (Signed)
Radiation Oncology Follow up Note  Name: Maria Barnes   Date:   10/31/2018 MRN:  [id number] DOB: [birth year]    This 63 y.o. female presents to the clinic today for 1 year follow-up status post whole breast radiation to her left breast for stage I ER PR positive invasive mammary carcinoma.  REFERRING PROVIDER: Maryland Pink, MD  HPI: Patient is a 63 year old female now seen out in 1 year having completed whole breast radiation to her left breast for stage I ER PR positive invasive mammary carcinoma.  Seen today in routine follow-up she is doing well.  She specifically denies breast tenderness cough or bone pain..  She had mammograms back in March and ultrasound both showing no evidence of disease which I have reviewed.  She is currently on letrozole tolerating that well without side effect.  COMPLICATIONS OF TREATMENT: none  FOLLOW UP COMPLIANCE: keeps appointments   PHYSICAL EXAM:  BP (!) 155/78 (BP Location: Right Arm, Patient Position: Sitting)   Pulse 67   Temp 98.4 F (36.9 C) (Tympanic)   Resp 16   Wt 159 lb 12.8 oz (72.5 kg)   BMI 28.31 kg/m  Lungs are clear to A&P cardiac examination essentially unremarkable with regular rate and rhythm. No dominant mass or nodularity is noted in either breast in 2 positions examined. Incision is well-healed. No axillary or supraclavicular adenopathy is appreciated. Cosmetic result is excellent.  Well-developed well-nourished patient in NAD. HEENT reveals PERLA, EOMI, discs not visualized.  Oral cavity is clear. No oral mucosal lesions are identified. Neck is clear without evidence of cervical or supraclavicular adenopathy. Lungs are clear to A&P. Cardiac examination is essentially unremarkable with regular rate and rhythm without murmur rub or thrill. Abdomen is benign with no organomegaly or masses noted. Motor sensory and DTR levels are equal and symmetric in the upper and lower extremities. Cranial nerves II through XII are grossly intact.  Proprioception is intact. No peripheral adenopathy or edema is identified. No motor or sensory levels are noted. Crude visual fields are within normal range.  RADIOLOGY RESULTS: Mammograms reviewed and ultrasound compatible with above-stated findings  PLAN: Present time patient is doing well with no evidence of disease.  I am pleased with her overall progress.  I have asked to see her out in 1 year for follow-up.  Patient is also scheduled for follow-up mammograms.  She continues on letrozole without side effect.  Patient knows to call with any concerns.  I would like to take this opportunity to thank you for allowing me to participate in the care of your patient.Noreene Filbert, MD

## 2018-11-19 IMAGING — MG MM PLC BREAST LOC DEV EA ADD LESION INC MAMMO GUIDE*L*
6 series · 6 of 6 positions shown · non-contrast
Comparison: Previous exams.

CLINICAL DATA: Bracketed for operative needle localization for left
breast IDC and DCIS.

EXAM:
NEEDLE LOCALIZATION OF THE LEFT BREAST WITH MAMMO GUIDANCE

[L LM (1 of 3)]
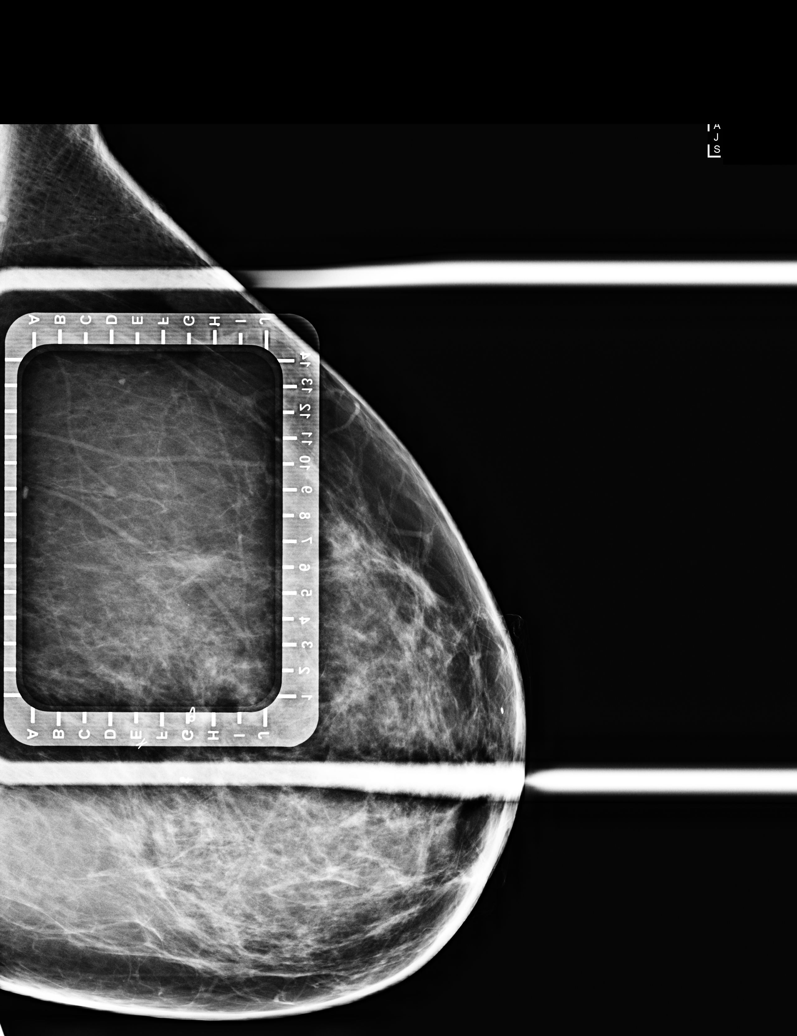

[L CC (1 of 3)]
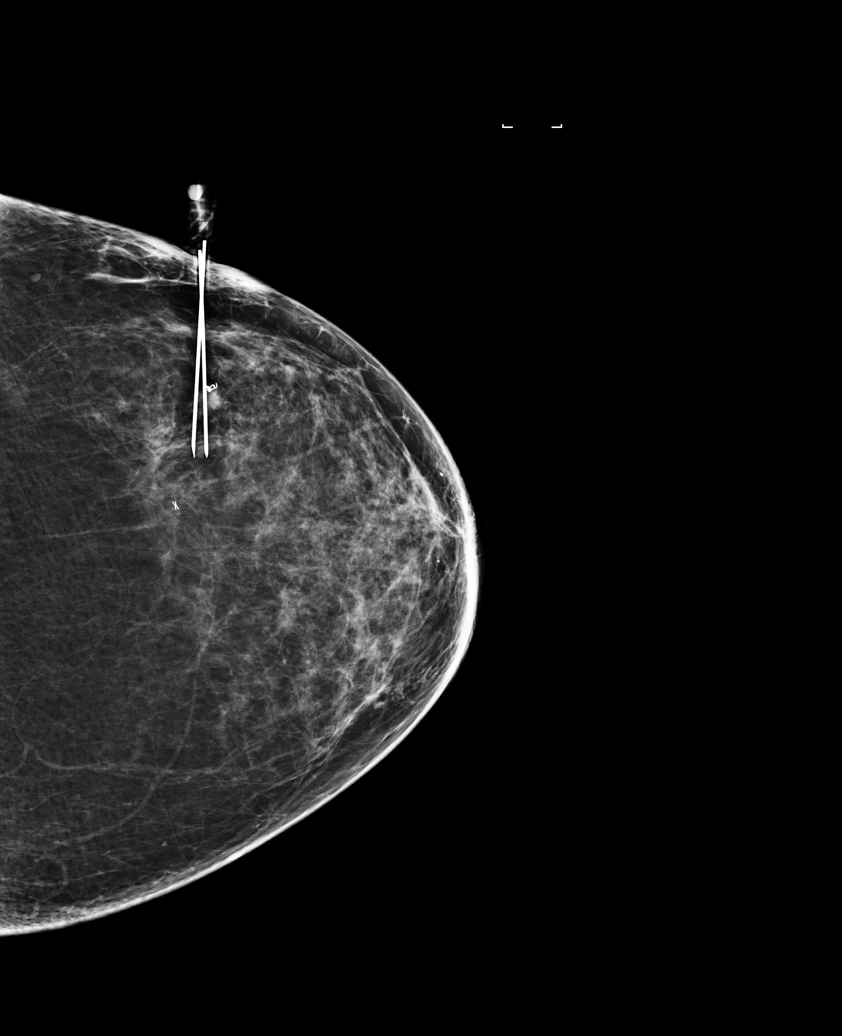

[L LM (2 of 3)]
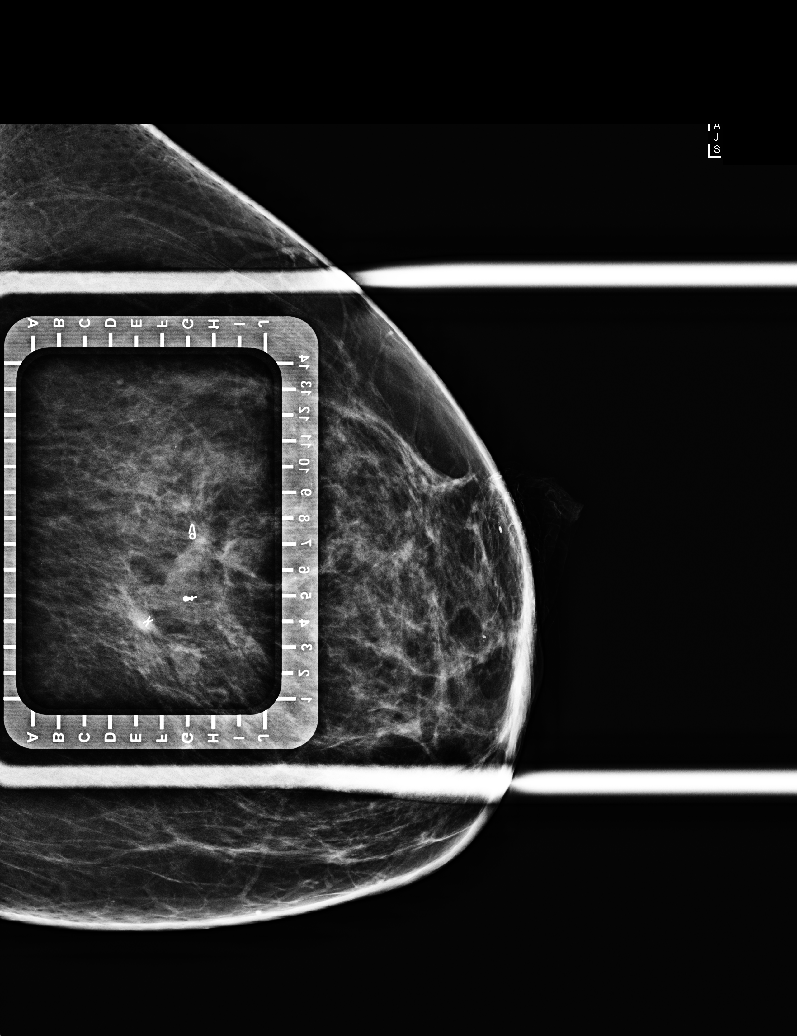

[L LM (3 of 3)]
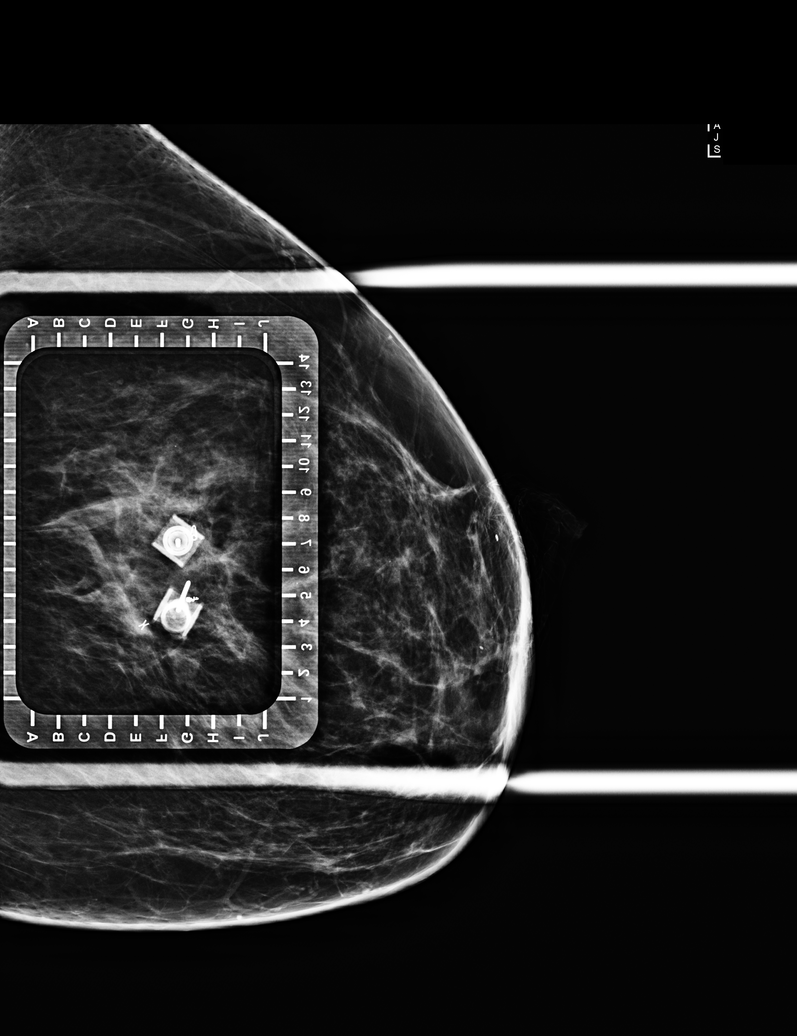

[L CC (2 of 3)]
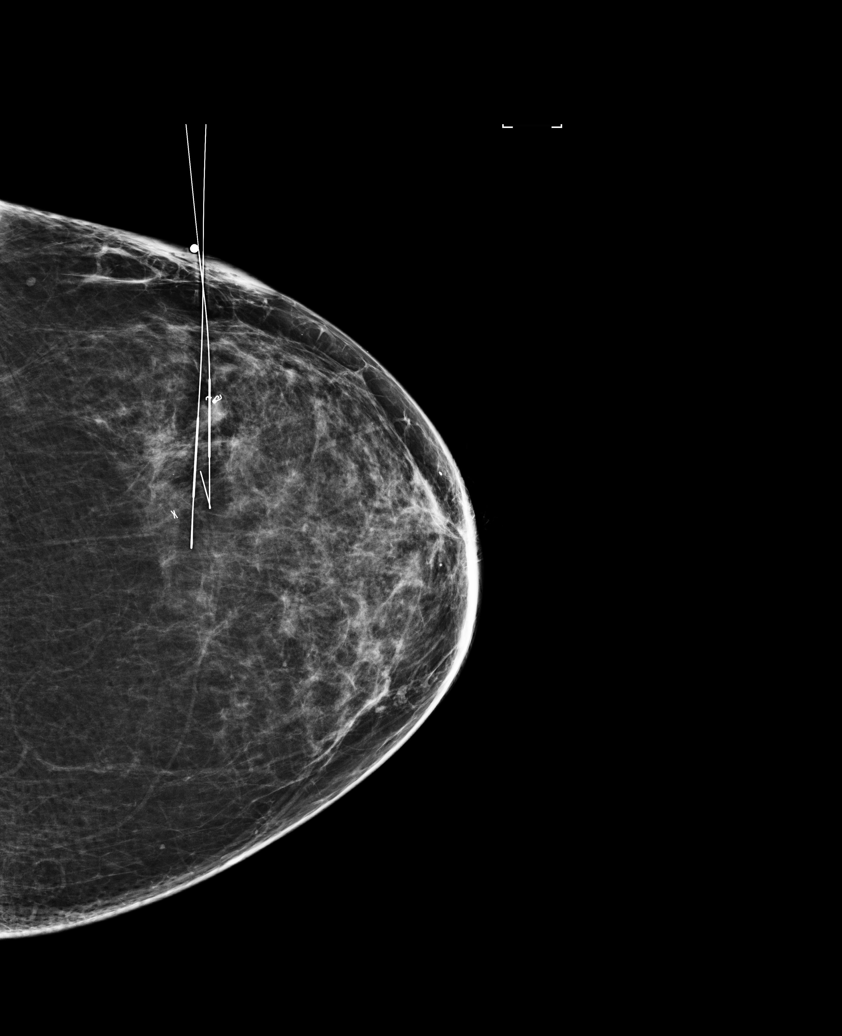

[L CC (3 of 3)]
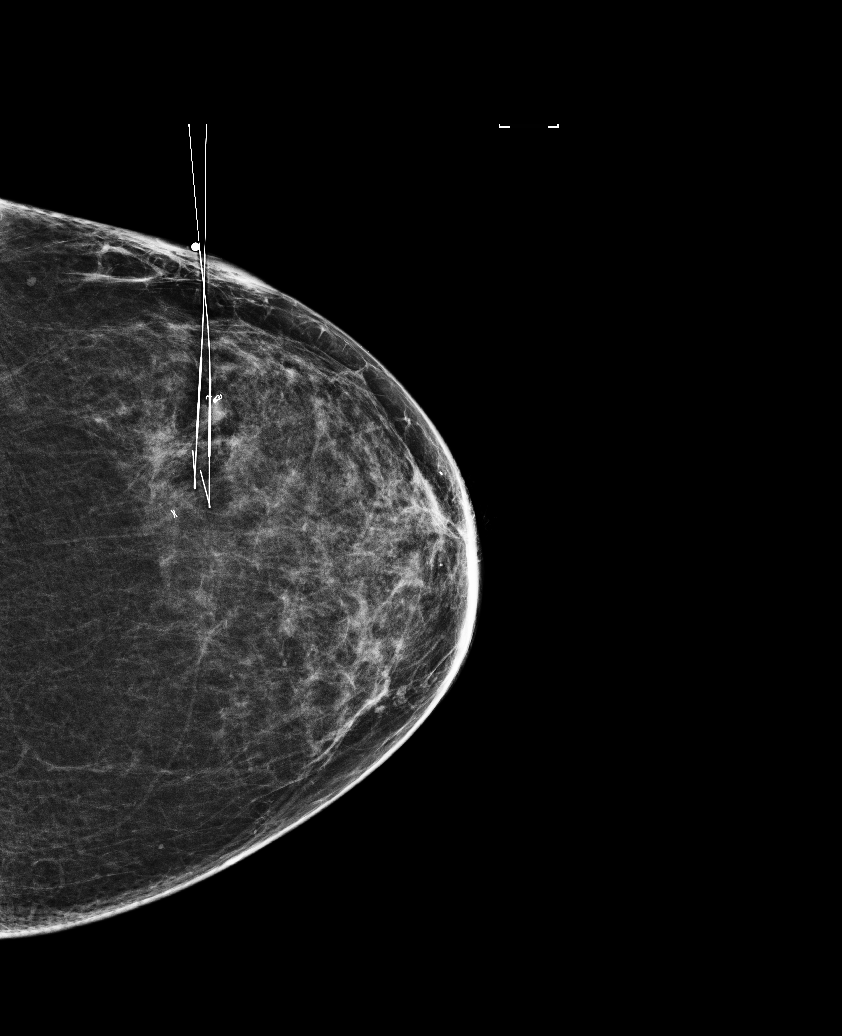

[6 of 6 positions shown; findings below may reference images not displayed]

FINDINGS: Patient presents for needle localization prior to left breast
lumpectomy. I met with the patient and we discussed the procedure of
needle localization including benefits and alternatives. We
discussed the high likelihood of a successful procedure. We
discussed the risks of the procedure, including infection, bleeding,
tissue injury, and further surgery. Informed, written consent was
given. The usual time-out protocol was performed immediately prior
to the procedure.

Using mammographic guidance, sterile technique, 1% lidocaine and two
5 cm modified Kopans needles, the post biopsy markers associated
with malignant pathology, wing and venous shaped, were localized
using lateral approach. The images were marked for Dr. Oliegreen.
IMPRESSION: Bracketed needle localization left breast. No apparent
complications.

## 2018-11-29 ENCOUNTER — Other Ambulatory Visit: Payer: Self-pay | Admitting: *Deleted

## 2018-11-29 MED ORDER — LETROZOLE 2.5 MG PO TABS: 2.5000 mg | ORAL_TABLET | Freq: Every day | ORAL | 1 refills | Status: DC

## 2018-12-11 IMAGING — CR DG CHEST 2V
1 series · 2 of 2 positions shown · non-contrast
Comparison: None.

CLINICAL DATA: Acute chest pain today.

EXAM:
CHEST - 2 VIEW

[Series 1: dg chest 2 view · 0.14mm/px · 2 of 2 slices shown]
[im 1/2]
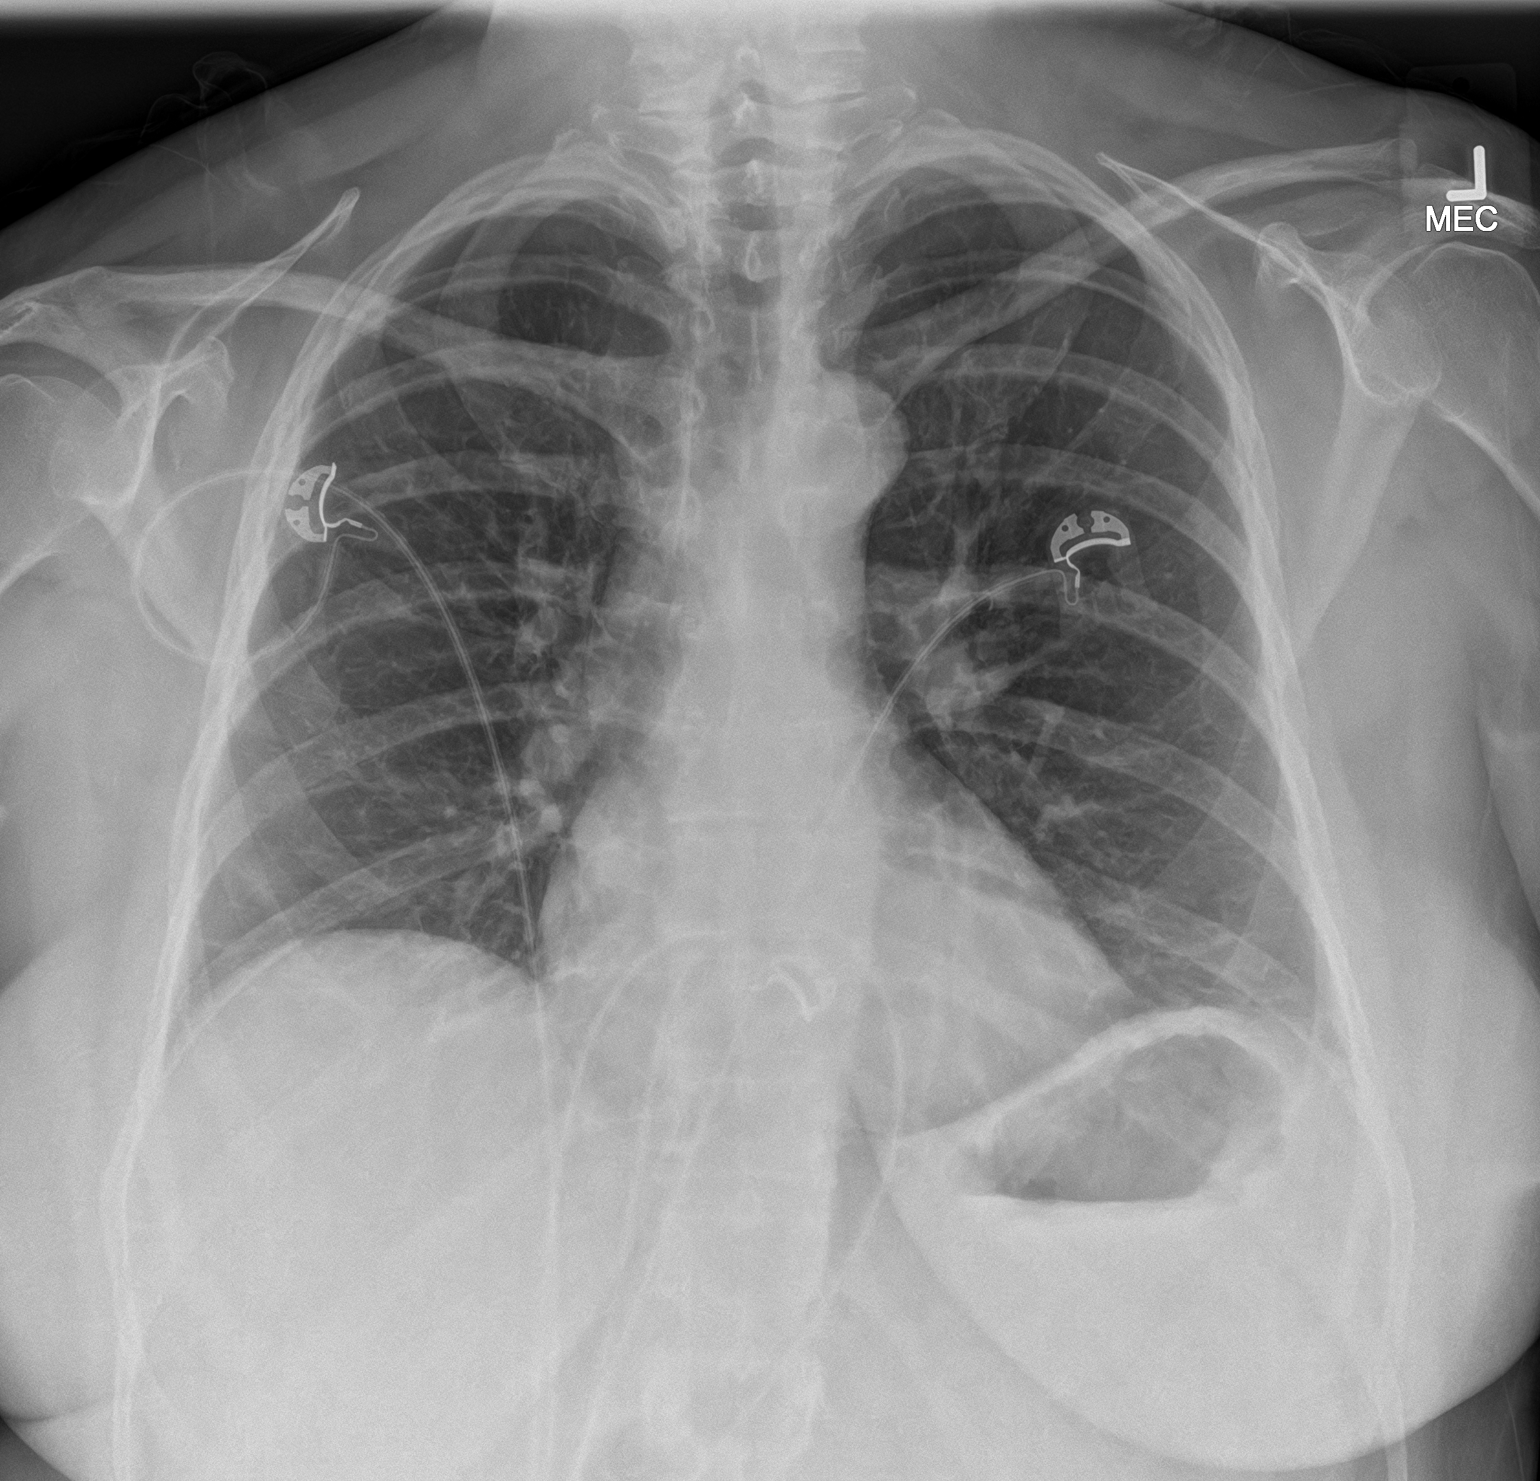
[im 2/2]
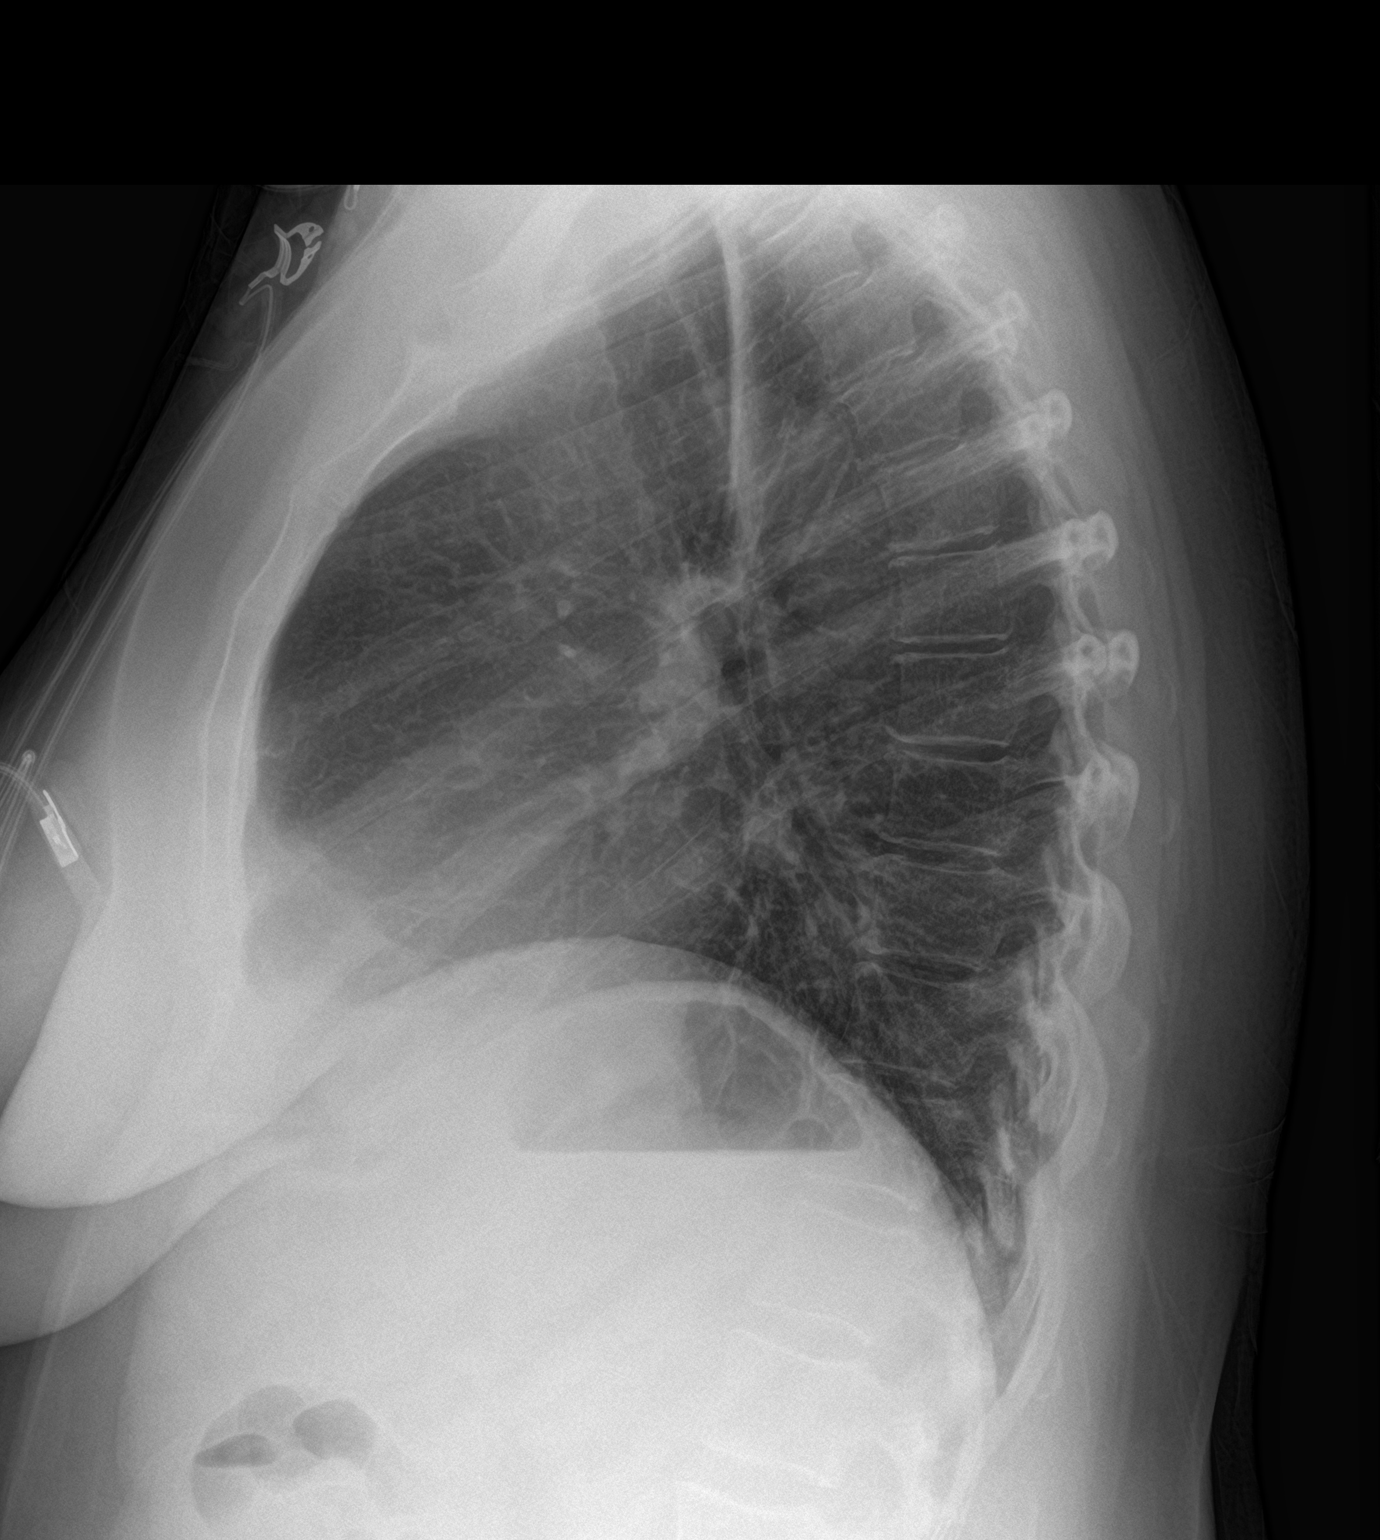

[2 of 2 positions shown; findings below may reference images not displayed]

FINDINGS: The cardiomediastinal silhouette is unremarkable.

There is no evidence of focal airspace disease, pulmonary edema,
suspicious pulmonary nodule/mass, pleural effusion, or pneumothorax.

No acute bony abnormalities are identified.
IMPRESSION: No active cardiopulmonary disease.

## 2019-01-28 ENCOUNTER — Ambulatory Visit: Payer: BLUE CROSS/BLUE SHIELD | Admitting: Oncology

## 2019-02-02 ENCOUNTER — Other Ambulatory Visit: Payer: Self-pay | Admitting: General Surgery

## 2019-02-02 DIAGNOSIS — C50412 Malignant neoplasm of upper-outer quadrant of left female breast: Secondary | ICD-10-CM

## 2019-02-12 NOTE — Progress Notes (Signed)
Left message to call back  

## 2019-02-13 ENCOUNTER — Encounter: Payer: Self-pay | Admitting: Oncology

## 2019-02-13 ENCOUNTER — Inpatient Hospital Stay: Payer: 59 | Attending: Oncology | Admitting: Oncology

## 2019-02-13 ENCOUNTER — Other Ambulatory Visit: Payer: Self-pay

## 2019-02-13 VITALS — BP 135/62 | HR 62 | Temp 98.6°F | Resp 16 | Wt 156.7 lb

## 2019-02-13 DIAGNOSIS — C50412 Malignant neoplasm of upper-outer quadrant of left female breast: Secondary | ICD-10-CM | POA: Diagnosis not present

## 2019-02-13 DIAGNOSIS — Z08 Encounter for follow-up examination after completed treatment for malignant neoplasm: Secondary | ICD-10-CM | POA: Diagnosis not present

## 2019-02-13 DIAGNOSIS — Z923 Personal history of irradiation: Secondary | ICD-10-CM | POA: Diagnosis not present

## 2019-02-13 DIAGNOSIS — Z170 Estrogen receptor positive status [ER+]: Secondary | ICD-10-CM | POA: Diagnosis not present

## 2019-02-13 DIAGNOSIS — Z853 Personal history of malignant neoplasm of breast: Secondary | ICD-10-CM | POA: Diagnosis not present

## 2019-02-13 DIAGNOSIS — Z79811 Long term (current) use of aromatase inhibitors: Secondary | ICD-10-CM | POA: Insufficient documentation

## 2019-02-13 NOTE — Progress Notes (Signed)
Pt does not have any breast concerns

## 2019-02-14 ENCOUNTER — Inpatient Hospital Stay: Payer: Self-pay | Admitting: Oncology
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
## 2019-03-30 NOTE — Progress Notes (Signed)
Cardiology Office Note  Date:  03/31/2019   ID:  EMILE PERON, DOB 1955-11-11, MRN [id number]  PCP:  Maryland Pink, MD   Chief Complaint  Patient presents with  . office visit    Pt having rapid heart beat. Meds verbally reviewed w/ pt.    HPI:  64 year old woman with  Past medical history of SVT  hypertension  lumpectomy for breast cancer,  substernal chest pain, diaphoresis sweating , troponins up to 1.5 CTA chest was performed and was neg for PE.  Cardiac cath with suspected coronary dissection of the mid to distal LAD with severe stenosis/tapering mid LAD to apical region Severe apical and periapical akinesis. Mildly depressed EF, 45 to 50% Who presents for follow-up of her coronary artery disease , dissection  Father passed 12/2018 Esophageal cancer  In general she is doing well, still adjusting Rare palpitations, possibly from recent stress  Sedentary, no exercise program Previously completed cardiac rehab, was walking in the mall before Not any longer  Denies any significant chest pain or shortness of breath on exertion  Labs reviewed Total chol 130, LDL55 HAB1C 6.1   EKG personally reviewed by myself on todays visit Shows normal sinus rhythm with rate 71 bpm no significant ST or T wave changes  Records from the hospital [facility] 06/22/17:   Mid LAD to Dist LAD lesion is 80% stenosed.  The left ventricular ejection fraction is 45-50% by visual estimate.  LV end diastolic pressure is normal.  There is mild left ventricular systolic dysfunction.  There is no mitral valve regurgitation.   Echo 06/22/17: - Left ventricle: The cavity size was normal. Systolic function wasnormal. The estimated ejection fraction was in the range of 50% to 55%. Select images with at least moderate hypokinesis in theapical, periapical and apical septal region Doppler parameters are consistent with abnormal left ventricular relaxation (grade 1 diastolic dysfunction). - Left  atrium: The atrium was normal in size. - Right ventricle: Systolic function was normal. - Pulmonary arteries: Systolic pressure was within the normal range.  PMH:   has a past medical history of Atypical chest pain, Breast cancer ([place]), Breast cancer of upper-outer quadrant of left female breast ([place]) (05/30/2017), Colon polyps, Complication of anesthesia, Concussion ([id number]), Diastolic dysfunction, Family history of adverse reaction to anesthesia, GERD (gastroesophageal reflux disease), History of hiatal hernia, Hypertension, Migraine, Osteopenia, Personal history of radiation therapy, PONV (postoperative nausea and vomiting), Shingles, Spontaneous dissection of coronary artery (06/21/2017), SVT (supraventricular tachycardia) ([place]), VAIN I (vaginal intraepithelial neoplasia grade I), and Varicose veins.  PSH:    Past Surgical History:  Procedure Laterality Date  . BREAST BIOPSY Left 05/01/2017    x 2 areas.  2:00 6cmfn irregular mass wing clip.  2:00 6cmfn round mass venus clip. DUCTAL CARCINOMA IN SITU and INVASIVE MAMMARY CARCINOMA  . BREAST BIOPSY Left 05/10/2017   affirm bx with  X marker  PSEUDO-ANGIOMATOUS STROMAL HYPERPLASIA /Upper outer Quad  . BREAST LUMPECTOMY Left 05/30/2017   lumpectomy of wing and venus markers, invasive mammary carcinoma and DCIS  . BREAST LUMPECTOMY WITH SENTINEL LYMPH NODE BIOPSY Left 05/30/2017   Procedure: BREAST LUMPECTOMY WITH SENTINEL LYMPH NODE BX,NEEDLE LOCALIZATION;  Surgeon: Robert Bellow, MD;  Location: [facility] ORS;  Service: General;  Laterality: Left;  . cervical cryosurgery  1980  . COLONOSCOPY  2011  . DILATION AND CURETTAGE OF UTERUS     x 2  . LEFT HEART CATH AND CORONARY ANGIOGRAPHY N/A 06/22/2017   Procedure: LEFT HEART CATH AND  CORONARY ANGIOGRAPHY;  Surgeon: Minna Merritts, MD;  Location: [place] [facility];  Service: Cardiovascular;  Laterality: N/A;  . TUBAL LIGATION    . VAGINAL HYSTERECTOMY     tvh    Current Outpatient  Medications  Medication Sig Dispense Refill  . acetaminophen (TYLENOL) 500 MG tablet Take 1,000 mg by mouth every 6 (six) hours as needed (for pain/headaches.).    Marland Kitchen aspirin 81 MG EC tablet Take 1 tablet (81 mg total) by mouth daily. 30 tablet 0  . atorvastatin (LIPITOR) 80 MG tablet Take 1 tablet (80 mg total) by mouth daily at 6 PM. 90 tablet 4  . calcium-vitamin D (OSCAL WITH D) 500-200 MG-UNIT tablet Take 1 tablet by mouth.    . letrozole (FEMARA) 2.5 MG tablet Take 1 tablet (2.5 mg total) by mouth daily. 90 tablet 1  . metoprolol tartrate (LOPRESSOR) 50 MG tablet Take 1 tablet (50 mg total) by mouth 2 (two) times daily. Taking 1 tablet daily 180 tablet 3  . Multiple Vitamin (MULTIVITAMIN WITH MINERALS) TABS tablet Take 1 tablet by mouth daily.    . nitroGLYCERIN (NITROSTAT) 0.4 MG SL tablet Place 1 tablet (0.4 mg total) under the tongue every 5 (five) minutes as needed for chest pain. 25 tablet 3  . pantoprazole (PROTONIX) 40 MG tablet Take 40 mg by mouth daily.  5  . vitamin C (ASCORBIC ACID) 500 MG tablet Take 500 mg by mouth daily.     No current facility-administered medications for this visit.     Allergies:   Patient has no known allergies.   Social History:  The patient  reports that she has never smoked. She has never used smokeless tobacco. She reports that she does not drink alcohol or use drugs.   Family History:   family history includes Anxiety disorder in her brother; Breast cancer (age of onset: 76) in her paternal aunt; Breast cancer (age of onset: 36) in her mother; Cancer in her paternal aunt; Diabetes in her father; Esophageal cancer (age of onset: 47) in her father; Hypertension in her brother, brother, and father; Stomach cancer in her paternal grandfather; Thyroid disease in her father.    Review of Systems: Review of Systems  Constitutional: Negative.   Respiratory: Negative.   Cardiovascular: Positive for palpitations.  Gastrointestinal: Negative.    Musculoskeletal: Negative.   Neurological: Negative.   Psychiatric/Behavioral: Negative.   All other systems reviewed and are negative.   PHYSICAL EXAM: VS:  BP (!) 142/82 (BP Location: Left Arm, Patient Position: Sitting, Cuff Size: Normal)   Pulse 71   Ht 5\' 3"  (1.6 m)   Wt 162 lb 8 oz (73.7 kg)   SpO2 96%   BMI 28.79 kg/m  , BMI Body mass index is 28.79 kg/m. Constitutional:  oriented to person, place, and time. No distress.  HENT:  Head: Normocephalic and atraumatic.  Eyes:  no discharge. No scleral icterus.  Neck: Normal range of motion. Neck supple. No JVD present.  Cardiovascular: Normal rate, regular rhythm, normal heart sounds and intact distal pulses. Exam reveals no gallop and no friction rub. No edema No murmur heard. Pulmonary/Chest: Effort normal and breath sounds normal. No stridor. No respiratory distress.  no wheezes.  no rales.  no tenderness.  Abdominal: Soft.  no distension.  no tenderness.  Musculoskeletal: Normal range of motion.  no  tenderness or deformity.  Neurological:  normal muscle tone. Coordination normal. No atrophy Skin: Skin is warm and dry. No rash noted. not  diaphoretic.  Psychiatric:  normal mood and affect. behavior is normal. Thought content normal.   Recent Labs: No results found for requested labs within last 8760 hours.    Lipid Panel Lab Results  Component Value Date   CHOL 114 10/09/2017   HDL 39 (L) 10/09/2017   LDLCALC 52 10/09/2017   TRIG 117 10/09/2017      Wt Readings from Last 3 Encounters:  03/31/19 162 lb 8 oz (73.7 kg)  02/13/19 156 lb 11.2 oz (71.1 kg)  10/31/18 159 lb 12.8 oz (72.5 kg)     ASSESSMENT AND PLAN:  NSTEMI (non-ST elevated myocardial infarction) ([place]) -   spontaneous coronary dissection  echocardiogram with minimally depressed ejection fraction and region of hypokinesis in the mid to distal LAD territory Stable, no angina No further ischemic work-up needed at this time  Essential (primary)  hypertension Was taking metoprolol tartrate 100 mg in the morning Suggested she change back to metoprolol tartrate 50 twice daily Blood pressure is well controlled on today's visit. No changes made to the medications.  Supraventricular tachycardia (HCC) No recent episodes of SVT metoprolol tartrate 50 twice daily  Stable, no monitor needed  Breast cancer Completed radiation treatment, on the left On maintenance medication, letrazole stable  Coronary artery dissection Cholesterol is at goal on the current lipid regimen. No changes to the medications were made. Stress reduction techniques, walking program  Disposition:   F/U  12 months   Total encounter time more than 25 minutes  Greater than 50% was spent in counseling and coordination of care with the patient    Orders Placed This Encounter  Procedures  . EKG 12-Lead     Signed, Esmond Plants, M.D., Ph.D. 03/31/2019  [place], [place]

## 2019-03-31 ENCOUNTER — Other Ambulatory Visit: Payer: Self-pay

## 2019-03-31 ENCOUNTER — Ambulatory Visit (INDEPENDENT_AMBULATORY_CARE_PROVIDER_SITE_OTHER): Payer: 59 | Admitting: Cardiovascular Disease

## 2019-03-31 ENCOUNTER — Encounter: Payer: Self-pay | Admitting: Cardiovascular Disease

## 2019-03-31 VITALS — BP 142/82 | HR 71 | Ht 63.0 in | Wt 162.5 lb

## 2019-03-31 DIAGNOSIS — E782 Mixed hyperlipidemia: Secondary | ICD-10-CM | POA: Diagnosis not present

## 2019-03-31 DIAGNOSIS — I471 Supraventricular tachycardia: Secondary | ICD-10-CM

## 2019-03-31 DIAGNOSIS — I10 Essential (primary) hypertension: Secondary | ICD-10-CM

## 2019-03-31 DIAGNOSIS — I25118 Atherosclerotic heart disease of native coronary artery with other forms of angina pectoris: Secondary | ICD-10-CM

## 2019-03-31 DIAGNOSIS — I2542 Coronary artery dissection: Secondary | ICD-10-CM

## 2019-03-31 MED ORDER — METOPROLOL TARTRATE 50 MG PO TABS: 50.0000 mg | ORAL_TABLET | Freq: Two times a day (BID) | ORAL | 3 refills | Status: DC

## 2019-03-31 MED ORDER — ATORVASTATIN CALCIUM 80 MG PO TABS: 80.0000 mg | ORAL_TABLET | Freq: Every day | ORAL | 4 refills | Status: DC

## 2019-03-31 NOTE — Patient Instructions (Addendum)
Medication Instructions:  Change metoprolol to 50 twice a day  If you need a refill on your cardiac medications before your next appointment, please call your pharmacy.    Lab work: No new labs needed   If you have labs (blood work) drawn today and your tests are completely normal, you will receive your results only by: Marland Kitchen MyChart Message (if you have MyChart) OR . A paper copy in the mail If you have any lab test that is abnormal or we need to change your treatment, we will call you to review the results.   Testing/Procedures: No new testing needed   Follow-Up: At [facility], you and your health needs are our priority.  As part of our continuing mission to provide you with exceptional heart care, we have created designated Provider Care Teams.  These Care Teams include your primary Cardiologist (physician) and Advanced Practice Providers (APPs -  Physician Assistants and Nurse Practitioners) who all work together to provide you with the care you need, when you need it.  . You will need a follow up appointment in 12 months   . Providers on your designated Care Team:   . Murray Hodgkins, NP . Christell Faith, PA-C . Marrianne Mood, PA-C  Any Other Special Instructions Will Be Listed Below (If Applicable).  For educational health videos Log in to : [email or web address] Or : [email or web address], password : [email or web address]

## 2019-04-17 ENCOUNTER — Ambulatory Visit
Admission: RE | Admit: 2019-04-17 | Discharge: 2019-04-17 | Disposition: A | Discharge status: Home / Self Care | Payer: 59 | Source: Ambulatory Visit | Attending: General Surgery | Admitting: General Surgery

## 2019-04-17 DIAGNOSIS — C50412 Malignant neoplasm of upper-outer quadrant of left female breast: Secondary | ICD-10-CM | POA: Diagnosis present

## 2019-04-17 DIAGNOSIS — Z170 Estrogen receptor positive status [ER+]: Secondary | ICD-10-CM | POA: Insufficient documentation

## 2019-04-30 IMAGING — CR DG CHEST 2V
1 series · 2 of 2 positions shown · non-contrast
Comparison: 06/21/2017

CLINICAL DATA: Chest pain

EXAM:
CHEST - 2 VIEW

[Series 1: w chest pa · 0.14mm/px · 2 of 2 slices shown]
[im 1/2]
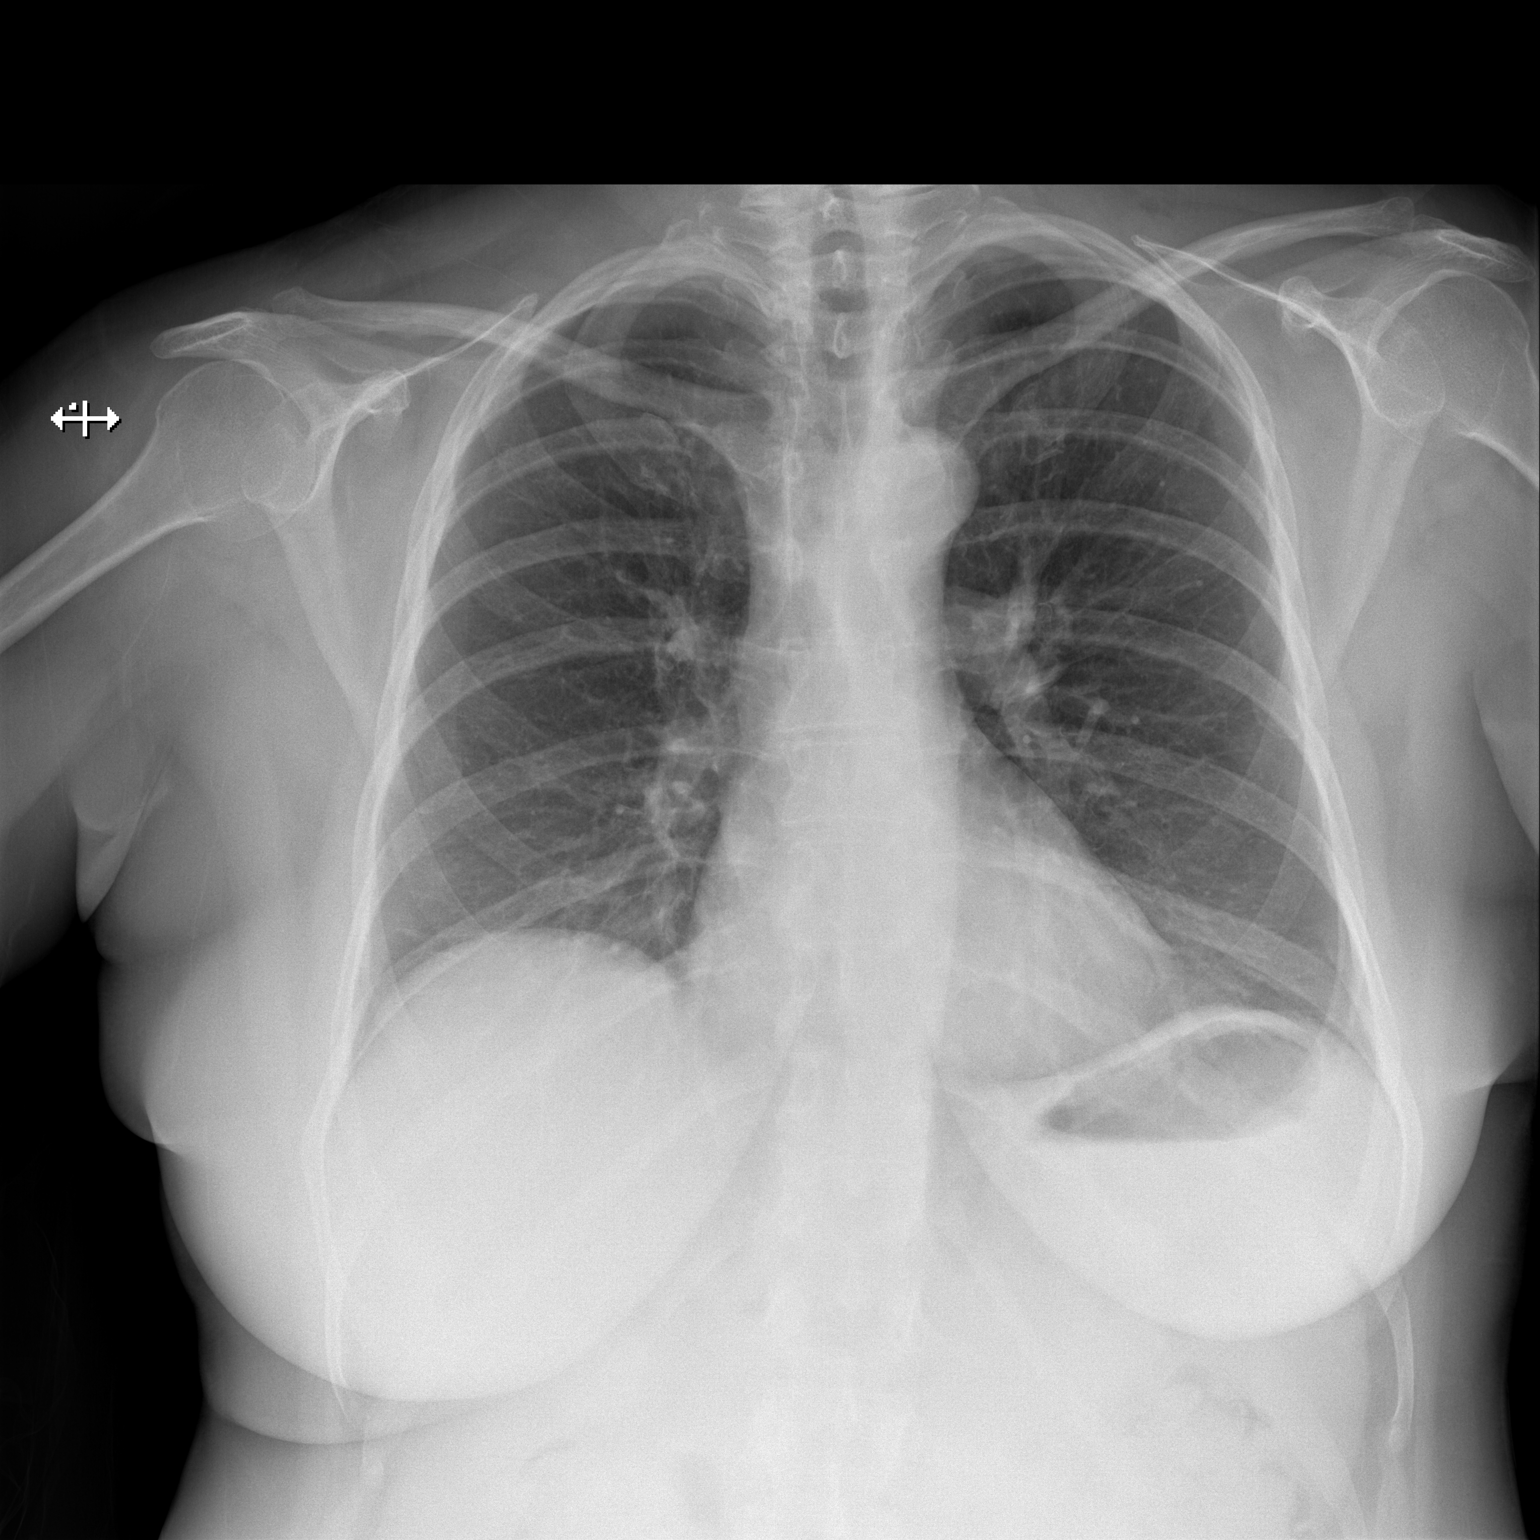
[im 2/2]
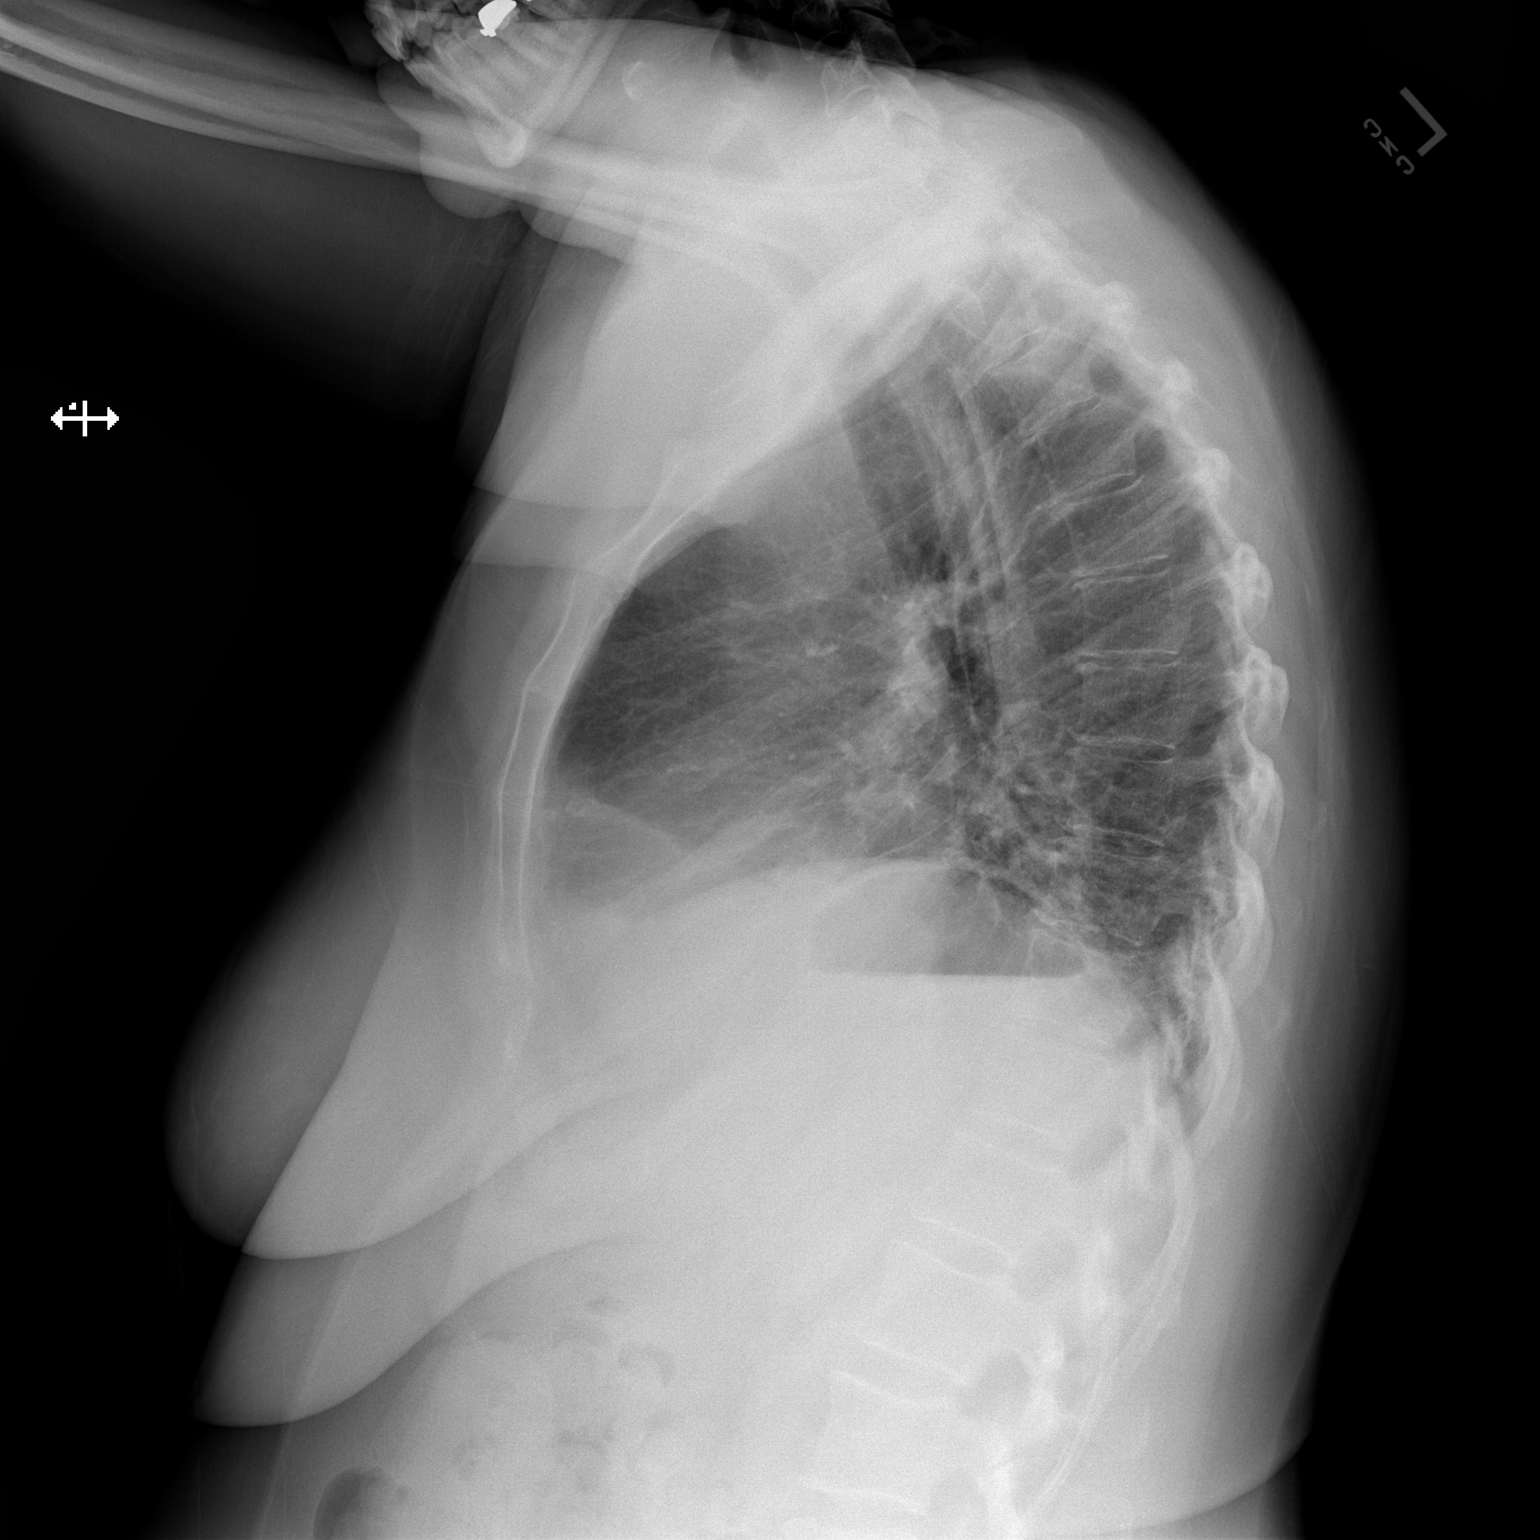

[2 of 2 positions shown; findings below may reference images not displayed]

FINDINGS: Linear atelectasis or scarring in the lung bases, similar to prior
study. Heart is normal size. No effusions or acute bony abnormality.
IMPRESSION: Bibasilar scarring or atelectasis, similar to prior study.

## 2019-05-31 ENCOUNTER — Other Ambulatory Visit: Payer: Self-pay | Admitting: Oncology

## 2019-08-14 ENCOUNTER — Other Ambulatory Visit: Payer: Self-pay

## 2019-08-14 ENCOUNTER — Inpatient Hospital Stay: Payer: 59 | Attending: Oncology | Admitting: Oncology

## 2019-08-14 ENCOUNTER — Encounter: Payer: Self-pay | Admitting: Oncology

## 2019-08-14 DIAGNOSIS — Z08 Encounter for follow-up examination after completed treatment for malignant neoplasm: Secondary | ICD-10-CM | POA: Diagnosis not present

## 2019-08-14 DIAGNOSIS — Z79811 Long term (current) use of aromatase inhibitors: Secondary | ICD-10-CM | POA: Insufficient documentation

## 2019-08-14 DIAGNOSIS — C50412 Malignant neoplasm of upper-outer quadrant of left female breast: Secondary | ICD-10-CM | POA: Insufficient documentation

## 2019-08-14 DIAGNOSIS — Z853 Personal history of malignant neoplasm of breast: Secondary | ICD-10-CM

## 2019-08-14 DIAGNOSIS — Z923 Personal history of irradiation: Secondary | ICD-10-CM | POA: Insufficient documentation

## 2019-08-14 DIAGNOSIS — Z170 Estrogen receptor positive status [ER+]: Secondary | ICD-10-CM | POA: Insufficient documentation
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
## 2019-09-11 ENCOUNTER — Ambulatory Visit
Admission: RE | Admit: 2019-09-11 | Discharge: 2019-09-11 | Disposition: A | Discharge status: Home / Self Care | Payer: 59 | Source: Ambulatory Visit | Attending: Oncology | Admitting: Oncology

## 2019-09-11 ENCOUNTER — Other Ambulatory Visit: Payer: Self-pay

## 2019-09-11 DIAGNOSIS — Z08 Encounter for follow-up examination after completed treatment for malignant neoplasm: Secondary | ICD-10-CM | POA: Insufficient documentation

## 2019-09-11 DIAGNOSIS — Z780 Asymptomatic menopausal state: Secondary | ICD-10-CM | POA: Insufficient documentation

## 2019-09-11 DIAGNOSIS — Z13820 Encounter for screening for osteoporosis: Secondary | ICD-10-CM | POA: Diagnosis not present

## 2019-09-11 DIAGNOSIS — Z853 Personal history of malignant neoplasm of breast: Secondary | ICD-10-CM | POA: Insufficient documentation

## 2019-09-11 DIAGNOSIS — Z79811 Long term (current) use of aromatase inhibitors: Secondary | ICD-10-CM | POA: Diagnosis not present

## 2019-10-01 ENCOUNTER — Ambulatory Visit (INDEPENDENT_AMBULATORY_CARE_PROVIDER_SITE_OTHER): Payer: 59 | Admitting: Obstetrics and Gynecology

## 2019-10-01 ENCOUNTER — Encounter: Payer: Self-pay | Admitting: Obstetrics and Gynecology

## 2019-10-01 ENCOUNTER — Other Ambulatory Visit: Payer: Self-pay

## 2019-10-01 VITALS — BP 138/83 | HR 82 | Ht 63.0 in | Wt 170.2 lb

## 2019-10-01 DIAGNOSIS — N8111 Cystocele, midline: Secondary | ICD-10-CM | POA: Diagnosis not present

## 2019-10-01 DIAGNOSIS — Z01419 Encounter for gynecological examination (general) (routine) without abnormal findings: Secondary | ICD-10-CM | POA: Diagnosis not present

## 2019-10-01 DIAGNOSIS — Z853 Personal history of malignant neoplasm of breast: Secondary | ICD-10-CM

## 2019-10-01 NOTE — Progress Notes (Signed)
HPI:      Maria Barnes is a 64 y.o. [phone] who LMP was No LMP recorded. Patient has had a hysterectomy.  Subjective:   She presents today for her annual examination.  She has no complaints.  She does state that she has been somewhat inactive over the last year and this has caused some weight gain that she is unhappy with.  She has plans to begin walking and exercising more. Patient has had left sided breast cancer and a concurrent heart attack 2 years ago.  She had radiotherapy and is currently taking hormone therapy for her breast cancer.    Hx: The following portions of the patient's history were reviewed and updated as appropriate:             She  has a past medical history of Atypical chest pain, Breast cancer ([place]), Breast cancer of upper-outer quadrant of left female breast ([place]) (05/30/2017), Colon polyps, Complication of anesthesia, Concussion (9371), Diastolic dysfunction, Family history of adverse reaction to anesthesia, GERD (gastroesophageal reflux disease), History of hiatal hernia, Hypertension, Migraine, Osteopenia, Personal history of radiation therapy, PONV (postoperative nausea and vomiting), Shingles, Spontaneous dissection of coronary artery (06/21/2017), SVT (supraventricular tachycardia) ([address]), VAIN I (vaginal intraepithelial neoplasia grade I), and Varicose veins. She does not have any pertinent problems on file. She  has a past surgical history that includes Tubal ligation; Dilation and curettage of uterus; cervical cryosurgery (1980); Colonoscopy (2011); Vaginal hysterectomy; Breast lumpectomy (Left, 05/30/2017); Breast lumpectomy with sentinel lymph node bx (Left, 05/30/2017); LEFT HEART CATH AND CORONARY ANGIOGRAPHY (N/A, 06/22/2017); Breast biopsy (Left, 05/01/2017); Breast biopsy (Left, 05/10/2017); and Breast biopsy (Left, 2020). Her family history includes Anxiety disorder in her brother; Breast cancer (age of onset: 55) in her paternal aunt; Breast cancer (age of  onset: 56) in her mother; Cancer in her paternal aunt; Diabetes in her father; Esophageal cancer (age of onset: 57) in her father; Hypertension in her brother, brother, and father; Stomach cancer in her paternal grandfather; Thyroid disease in her father. She  reports that she has never smoked. She has never used smokeless tobacco. She reports that she does not drink alcohol and does not use drugs. She has a current medication list which includes the following prescription(s): acetaminophen, aspirin, atorvastatin, calcium-vitamin d, letrozole, metoprolol tartrate, multivitamin with minerals, pantoprazole, vitamin c, and nitroglycerin. She has No Known Allergies.       Review of Systems:  Review of Systems  Constitutional: Denied constitutional symptoms, night sweats, recent illness, fatigue, fever, insomnia and weight loss.  Eyes: Denied eye symptoms, eye pain, photophobia, vision change and visual disturbance.  Ears/Nose/Throat/Neck: Denied ear, nose, throat or neck symptoms, hearing loss, nasal discharge, sinus congestion and sore throat.  Cardiovascular: Denied cardiovascular symptoms, arrhythmia, chest pain/pressure, edema, exercise intolerance, orthopnea and palpitations.  Respiratory: Denied pulmonary symptoms, asthma, pleuritic pain, productive sputum, cough, dyspnea and wheezing.  Gastrointestinal: Denied, gastro-esophageal reflux, melena, nausea and vomiting.  Genitourinary: Denied genitourinary symptoms including symptomatic vaginal discharge, pelvic relaxation issues, and urinary complaints.  Musculoskeletal: Denied musculoskeletal symptoms, stiffness, swelling, muscle weakness and myalgia.  Dermatologic: Denied dermatology symptoms, rash and scar.  Neurologic: Denied neurology symptoms, dizziness, headache, neck pain and syncope.  Psychiatric: Denied psychiatric symptoms, anxiety and depression.  Endocrine: Denied endocrine symptoms including hot flashes and night sweats.    Meds:   Current Outpatient Medications on File Prior to Visit  Medication Sig Dispense Refill  . acetaminophen (TYLENOL) 500 MG tablet Take 1,000 mg by mouth  every 6 (six) hours as needed (for pain/headaches.).    Marland Kitchen aspirin 81 MG EC tablet Take 1 tablet (81 mg total) by mouth daily. 30 tablet 0  . atorvastatin (LIPITOR) 80 MG tablet Take 1 tablet (80 mg total) by mouth daily at 6 PM. 90 tablet 4  . calcium-vitamin D (OSCAL WITH D) 500-200 MG-UNIT tablet Take 2 tablets by mouth daily with breakfast.     . letrozole (FEMARA) 2.5 MG tablet TAKE 1 TABLET BY MOUTH EVERY DAY 90 tablet 1  . metoprolol tartrate (LOPRESSOR) 50 MG tablet Take 1 tablet (50 mg total) by mouth 2 (two) times daily. Taking 1 tablet daily 180 tablet 3  . Multiple Vitamin (MULTIVITAMIN WITH MINERALS) TABS tablet Take 1 tablet by mouth daily.    . pantoprazole (PROTONIX) 40 MG tablet Take 40 mg by mouth daily.  5  . vitamin C (ASCORBIC ACID) 500 MG tablet Take 500 mg by mouth daily.    . nitroGLYCERIN (NITROSTAT) 0.4 MG SL tablet Place 1 tablet (0.4 mg total) under the tongue every 5 (five) minutes as needed for chest pain. (Patient not taking: Reported on 08/14/2019) 25 tablet 3   No current facility-administered medications on file prior to visit.    Objective:     Vitals:   10/01/19 0854  BP: 138/83  Pulse: 82              Physical examination General NAD, Conversant  HEENT Atraumatic; Op clear with mmm.  Normo-cephalic. Pupils reactive. Anicteric sclerae  Thyroid/Neck Smooth without nodularity or enlargement. Normal ROM.  Neck Supple.  Skin No rashes, lesions or ulceration. Normal palpated skin turgor. No nodularity.  Breasts: No masses or discharge.  Symmetric.  No axillary adenopathy.  Left breast reported as painful during exam.  Lungs: Clear to auscultation.No rales or wheezes. Normal Respiratory effort, no retractions.  Heart: NSR.  No murmurs or rubs appreciated. No periferal edema  Abdomen: Soft.   Non-tender.  No masses.  No HSM. No hernia  Extremities: Moves all appropriately.  Normal ROM for age. No lymphadenopathy.  Neuro: Oriented to PPT.  Normal mood. Normal affect.     Pelvic:   Vulva: Normal appearance.  No lesions.  Vagina: No lesions or abnormalities noted.  Moderate vaginal atrophy  Support:  Second-degree cystocele  Urethra No masses tenderness or scarring.  Meatus Normal size without lesions or prolapse.  Cervix:  Surgically absent  Anus: Normal exam.  No lesions.  Perineum: Normal exam.  No lesions.        Bimanual   Uterus:  Surgically absent  Adnexae: No masses.  Non-tender to palpation.  Cul-de-sac: Negative for abnormality.      Assessment:    [id number] Patient Active Problem List   Diagnosis Date Noted  . Mixed hyperlipidemia 03/21/2018  . Malignant neoplasm of upper-outer quadrant of left breast in female, estrogen receptor positive ([place]) 09/18/2017  . Vaginal atrophy 09/18/2017  . Stress incontinence, female 09/18/2017  . Chest pain   . Coronary artery dissection   . Non-ST elevation myocardial infarction (NSTEMI), subsequent episode of care ([place]) 06/21/2017  . Goals of care, counseling/discussion 05/18/2017  . Osteopenia 05/14/2017  . Primary cancer of upper outer quadrant of left breast ([place]) 05/13/2017  . Menopausal state 08/26/2015  . Status post vaginal hysterectomy 08/26/2015  . VIN (vulval intraepithelial neoplasia) I 08/26/2015  . Acid reflux 08/25/2014  . H/O varicella 08/25/2014  . LBP (low back pain) 08/25/2014  . Headache, migraine 08/25/2014  .  Herpes zona 08/25/2014  . Supraventricular tachycardia ([place]) 08/25/2014  . Essential hypertension 08/25/2014  . Avitaminosis D 08/25/2014  . Enthesopathy of hip 08/25/2014     1. Well woman exam with routine gynecological exam   2. Cystocele, midline   3. History of breast cancer     Patient relatively asymptomatic with second-degree cystocele.   Plan:            1.  Basic  Screening Recommendations The basic screening recommendations for asymptomatic women were discussed with the patient during her visit.  The age-appropriate recommendations were discussed with her and the rational for the tests reviewed.  When I am informed by the patient that another primary care physician has previously obtained the age-appropriate tests and they are up-to-date, only outstanding tests are ordered and referrals given as necessary.  Abnormal results of tests will be discussed with her when all of her results are completed.  Routine preventative health maintenance measures emphasized: Exercise/Diet/Weight control, Tobacco Warnings, Alcohol/Substance use risks and Stress Management Patient is followed closely with her history of breast cancer.  Gets mammography yearly.  Orders No orders of the defined types were placed in this encounter.   No orders of the defined types were placed in this encounter.           F/U  Return in about 1 year (around 09/30/2020) for Annual Physical.  Finis Bud, M.D. 10/01/2019 9:28 AM

## 2019-10-30 ENCOUNTER — Other Ambulatory Visit: Payer: Self-pay | Admitting: General Surgery

## 2019-10-30 NOTE — Progress Notes (Signed)
Subjective:     Patient ID: Maria Barnes is a 64 y.o. female.  HPI  The following portions of the patient's history were reviewed and updated as appropriate.  This an established patient is here today for: office visit. Patient is here today to schedule a colonoscopy. The patient had last one completed about 10 years ago by Dr. Vira Agar. Patient reports her bowel move once per day. She denies any rectal bleeding or mucus.       Chief Complaint  Patient presents with  . Colonoscopy     BP 152/82   Pulse 80   Temp 36.6 C (97.8 F)   Ht 157.5 cm (5\' 2" )   Wt 76.7 kg (169 lb)   SpO2 95%   BMI 30.91 kg/m       Past Medical History:  Diagnosis Date  . Breast cancer (CMS-HCC) 2019   left  . Diastolic dysfunction   . Dissection of coronary artery 2019  . Enthesopathy of hip region   . GERD (gastroesophageal reflux disease)   . Hiatal hernia   . History of chickenpox   . History of colon polyps   . History of radiation therapy   . History of varicose veins   . Hypercholesterolemia   . Lumbago   . Migraines   . Myocardial infarction (CMS-HCC)   . Osteopenia   . PONV (postoperative nausea and vomiting)   . Shingles   . Supraventricular tachycardia (CMS-HCC)   . Unspecified essential hypertension   . Unspecified vitamin D deficiency           Past Surgical History:  Procedure Laterality Date  . cardiac catheterization  2019  . COLONOSCOPY  11/26/2009   Dr. Kennis Carina @ [facility] - Hyperplastic  . cryosurgery of lesion of cervix  1980  . DILATION AND CURETTAGE, DIAGNOSTIC / THERAPEUTIC    . HYSTERECTOMY  1991   partial, vaginal   . INCISIONAL BIOPSY BREAST Left 2019  . LAPAROSCOPIC TUBAL LIGATION    . MASTECTOMY PARTIAL / LUMPECTOMY Left 2019              OB History    Gravida  2   Para  2   Term      Preterm      AB      Living        SAB      TAB      Ectopic      Molar      Multiple      Live Births           Obstetric Comments  Age at first period 62 Age of first pregnancy 40 LMP 63        Social History          Socioeconomic History  . Marital status: Married    Spouse name: Not on file  . Number of children: Not on file  . Years of education: Not on file  . Highest education level: Not on file  Occupational History  . Not on file  Tobacco Use  . Smoking status: Never Smoker  . Smokeless tobacco: Never Used  Substance and Sexual Activity  . Alcohol use: No    Alcohol/week: 0.0 standard drinks  . Drug use: Never  . Sexual activity: Not on file  Other Topics Concern  . Not on file  Social History Narrative  . Not on file   Social Determinants of Health  Financial Resource Strain:   . Difficulty of Paying Living Expenses:   Food Insecurity:   . Worried About Charity fundraiser in the Last Year:   . Arboriculturist in the Last Year:   Transportation Needs:   . Film/video editor (Medical):   Marland Kitchen Lack of Transportation (Non-Medical):        No Known Allergies  Current Medications  Current Outpatient Medications  Medication Sig Dispense Refill  . aspirin 81 MG EC tablet Take 81 mg by mouth once daily    . atorvastatin (LIPITOR) 80 MG tablet Take 1 tablet (80 mg total) by mouth once daily 90 tablet 3  . cholecalciferol (VITAMIN D3) 400 unit tablet Take 400 Units by mouth once daily    . letrozole [facility]) 2.5 mg tablet Take 2.5 mg by mouth once daily    . metoprolol tartrate (LOPRESSOR) 100 MG tablet Take 1 tablet (100 mg total) by mouth once daily 90 tablet 3  . multivitamin tablet Take 1 tablet by mouth once daily    . nitroGLYcerin (NITROSTAT) 0.4 MG SL tablet Place 0.4 mg under the tongue every 5 (five) minutes as needed for Chest pain May take up to 3 doses.    . pantoprazole (PROTONIX) 40 MG DR tablet Take 1 tablet (40 mg total) by mouth once daily 90 tablet 3  . polyethylene glycol (MIRALAX) powder One bottle for  colonoscopy prep. Use as directed. 255 g 0   No current facility-administered medications for this visit.           Family History  Problem Relation Age of Onset  . Breast cancer Mother   . Diabetes type II Father   . Thyroid disease Father   . Hyperlipidemia (Elevated cholesterol) Father   . High blood pressure (Hypertension) Father   . Esophageal cancer Father   . Reflux disease Father   . Irritable bowel syndrome Brother   . High blood pressure (Hypertension) Brother   . High blood pressure (Hypertension) Sister   . Colon cancer Neg Hx   . Colon polyps Neg Hx     Review of Systems  Constitutional: Negative for chills and fever.  Respiratory: Negative for cough.        Objective:   Physical Exam Exam conducted with a chaperone present.  Constitutional:      Appearance: Normal appearance.  Cardiovascular:     Rate and Rhythm: Normal rate and regular rhythm.     Pulses: Normal pulses.     Heart sounds: Normal heart sounds.  Pulmonary:     Effort: Pulmonary effort is normal.     Breath sounds: Normal breath sounds.  Musculoskeletal:     Cervical back: Neck supple.  Skin:    General: Skin is warm and dry.  Neurological:     Mental Status: She is alert and oriented to person, place, and time.  Psychiatric:        Mood and Affect: Mood normal.        Behavior: Behavior normal.     Labs and Radiology:   Colonoscopy 2011 (summary): Hyperplastic polyps, 10 year f/u recommended.       Assessment:     Candidate for colon cancer screening.    Plan:     Options for management reviewed: 1) Cologuard versus 2) colonoscopy.  Pros and cons of each as well as risks associated with colonoscopy were discussed in detail.  The patient desires to proceed to colonoscopy.  Patient to  be scheduled for a colonoscopy for 11-12-19 at [facility] It is okay for patient to continue an 81 mg aspirin once daily.     Entered by Ledell Noss, CMA, acting  as a scribe for Dr. Hervey Ard, MD.   The documentation recorded by the scribe accurately reflects the service I personally performed and the decisions made by me.   Robert Bellow, MD FACS

## 2019-10-31 ENCOUNTER — Encounter: Payer: Self-pay | Admitting: Radiation Oncology

## 2019-10-31 ENCOUNTER — Other Ambulatory Visit: Payer: Self-pay

## 2019-10-31 ENCOUNTER — Ambulatory Visit
Admission: RE | Admit: 2019-10-31 | Discharge: 2019-10-31 | Disposition: A | Discharge status: Home / Self Care | Payer: 59 | Source: Ambulatory Visit | Attending: Radiation Oncology | Admitting: Radiation Oncology

## 2019-10-31 VITALS — BP 142/74 | HR 75 | Temp 96.7°F | Wt 167.0 lb

## 2019-10-31 DIAGNOSIS — Z79811 Long term (current) use of aromatase inhibitors: Secondary | ICD-10-CM | POA: Diagnosis not present

## 2019-10-31 DIAGNOSIS — C50412 Malignant neoplasm of upper-outer quadrant of left female breast: Secondary | ICD-10-CM | POA: Insufficient documentation

## 2019-10-31 DIAGNOSIS — Z923 Personal history of irradiation: Secondary | ICD-10-CM | POA: Insufficient documentation

## 2019-10-31 DIAGNOSIS — Z170 Estrogen receptor positive status [ER+]: Secondary | ICD-10-CM | POA: Insufficient documentation

## 2019-10-31 NOTE — Progress Notes (Signed)
Radiation Oncology Follow up Note  Name: Maria Barnes   Date:   10/31/2019 MRN:  [id number] DOB: [birth year]    This 64 y.o. female presents to the clinic today for 2-year follow-up status post whole breast radiation to her left breast for stage I ER/PR positive invasive mammary carcinoma.  REFERRING PROVIDER: Maryland Pink, MD  HPI: Patient is a 64 year old female now out 2 years having completed whole breast radiation to her left breast for stage I ER/PR positive invasive mammary carcinoma.  Seen today in routine follow-up she is doing well.  She specifically denies breast tenderness cough or bone pain..  She is currently on letrozole tolerating it well without side effect.  She had mammograms back in March which I have reviewed were BI-RADS 2 benign  COMPLICATIONS OF TREATMENT: none  FOLLOW UP COMPLIANCE: keeps appointments   PHYSICAL EXAM:  BP (!) 142/74   Pulse 75   Temp (!) 96.7 F (35.9 C) (Tympanic)   Wt 167 lb (75.8 kg)   BMI 29.58 kg/m  Lungs are clear to A&P cardiac examination essentially unremarkable with regular rate and rhythm. No dominant mass or nodularity is noted in either breast in 2 positions examined. Incision is well-healed. No axillary or supraclavicular adenopathy is appreciated. Cosmetic result is excellent.  Well-developed well-nourished patient in NAD. HEENT reveals PERLA, EOMI, discs not visualized.  Oral cavity is clear. No oral mucosal lesions are identified. Neck is clear without evidence of cervical or supraclavicular adenopathy. Lungs are clear to A&P. Cardiac examination is essentially unremarkable with regular rate and rhythm without murmur rub or thrill. Abdomen is benign with no organomegaly or masses noted. Motor sensory and DTR levels are equal and symmetric in the upper and lower extremities. Cranial nerves II through XII are grossly intact. Proprioception is intact. No peripheral adenopathy or edema is identified. No motor or sensory levels are noted.  Crude visual fields are within normal range.  RADIOLOGY RESULTS: Mammograms reviewed compatible with above-stated findings  PLAN: Present time patient is doing well now out 2 years with no evidence of disease and pleased with her overall progress.  She is does have some slight asymmetry of her breasts with the left breast shrinking although this is not bothering the patient and cosmetic result is still good to excellent.  She continues on letrozole without side effect.  I will see her back in 1 year for follow-up.  She knows to call with any concerns.  I would like to take this opportunity to thank you for allowing me to participate in the care of your patient.Noreene Filbert, MD

## 2019-11-10 ENCOUNTER — Other Ambulatory Visit
Admission: RE | Admit: 2019-11-10 | Discharge: 2019-11-10 | Disposition: A | Discharge status: Home / Self Care | Payer: 59 | Source: Ambulatory Visit | Attending: General Surgery | Admitting: General Surgery

## 2019-11-10 ENCOUNTER — Other Ambulatory Visit: Payer: Self-pay

## 2019-11-10 DIAGNOSIS — Z01818 Encounter for other preprocedural examination: Secondary | ICD-10-CM | POA: Diagnosis present

## 2019-11-10 DIAGNOSIS — Z20822 Contact with and (suspected) exposure to covid-19: Secondary | ICD-10-CM | POA: Diagnosis not present

## 2019-11-10 LAB — SARS CORONAVIRUS 2 (TAT 6-24 HRS): SARS Coronavirus 2: NEGATIVE

## 2019-11-11 ENCOUNTER — Encounter: Payer: Self-pay | Admitting: General Surgery

## 2019-11-12 ENCOUNTER — Other Ambulatory Visit: Payer: Self-pay

## 2019-11-12 ENCOUNTER — Ambulatory Visit: Payer: 59 | Admitting: Anesthesiology

## 2019-11-12 ENCOUNTER — Ambulatory Visit
Admission: RE | Admit: 2019-11-12 | Discharge: 2019-11-12 | Disposition: A | Discharge status: Home / Self Care | Payer: 59 | Attending: General Surgery | Admitting: General Surgery

## 2019-11-12 ENCOUNTER — Encounter
Admission: RE | Disposition: A | Discharge status: Home / Self Care | Payer: Self-pay | Source: Home / Self Care | Attending: General Surgery

## 2019-11-12 DIAGNOSIS — Z7982 Long term (current) use of aspirin: Secondary | ICD-10-CM | POA: Insufficient documentation

## 2019-11-12 DIAGNOSIS — Z1211 Encounter for screening for malignant neoplasm of colon: Secondary | ICD-10-CM | POA: Diagnosis not present

## 2019-11-12 DIAGNOSIS — Z853 Personal history of malignant neoplasm of breast: Secondary | ICD-10-CM | POA: Diagnosis not present

## 2019-11-12 DIAGNOSIS — K219 Gastro-esophageal reflux disease without esophagitis: Secondary | ICD-10-CM | POA: Diagnosis not present

## 2019-11-12 DIAGNOSIS — I10 Essential (primary) hypertension: Secondary | ICD-10-CM | POA: Insufficient documentation

## 2019-11-12 DIAGNOSIS — K5730 Diverticulosis of large intestine without perforation or abscess without bleeding: Secondary | ICD-10-CM | POA: Diagnosis not present

## 2019-11-12 DIAGNOSIS — Z8719 Personal history of other diseases of the digestive system: Secondary | ICD-10-CM | POA: Insufficient documentation

## 2019-11-12 DIAGNOSIS — Z79899 Other long term (current) drug therapy: Secondary | ICD-10-CM | POA: Insufficient documentation

## 2019-11-12 HISTORY — PX: COLONOSCOPY WITH PROPOFOL: SHX5780

## 2019-11-12 SURGERY — COLONOSCOPY WITH PROPOFOL
Anesthesia: General

## 2019-11-12 MED ORDER — PROPOFOL 500 MG/50ML IV EMUL
INTRAVENOUS | Status: DC | PRN
  Administered 2019-11-12: 100 ug/kg/min via INTRAVENOUS

## 2019-11-12 MED ORDER — PROPOFOL 10 MG/ML IV BOLUS
INTRAVENOUS | Status: AC
  Filled 2019-11-12: qty 20

## 2019-11-12 MED ORDER — PROPOFOL 500 MG/50ML IV EMUL
INTRAVENOUS | Status: AC
  Filled 2019-11-12: qty 50

## 2019-11-12 MED ORDER — LIDOCAINE HCL (CARDIAC) PF 100 MG/5ML IV SOSY
PREFILLED_SYRINGE | INTRAVENOUS | Status: DC | PRN
  Administered 2019-11-12: 40 mg via INTRAVENOUS

## 2019-11-12 MED ORDER — SODIUM CHLORIDE 0.9 % IV SOLN
INTRAVENOUS | Status: DC
  Administered 2019-11-12: 20 mL/h via INTRAVENOUS

## 2019-11-12 NOTE — Op Note (Signed)
[facility] Gastroenterology Patient Name: Maria Barnes Procedure Date: 11/12/2019 7:42 AM MRN: [id number] Account #: [id number] Date of Birth: Aug 12, 1955 Admit Type: Outpatient Age: 63 Room: [facility] ENDO ROOM 1 Gender: Female Note Status: Finalized Procedure:             Colonoscopy Indications:           Screening for colorectal malignant neoplasm Providers:             Robert Bellow, MD Medicines:             Monitored Anesthesia Care Complications:         No immediate complications. Procedure:             Pre-Anesthesia Assessment:                        - Prior to the procedure, a History and Physical was                         performed, and patient medications, allergies and                         sensitivities were reviewed. The patient's tolerance                         of previous anesthesia was reviewed.                        - The risks and benefits of the procedure and the                         sedation options and risks were discussed with the                         patient. All questions were answered and informed                         consent was obtained.                        After obtaining informed consent, the colonoscope was                         passed under direct vision. Throughout the procedure,                         the patient's blood pressure, pulse, and oxygen                         saturations were monitored continuously. The                         Colonoscope was introduced through the anus and                         advanced to the the cecum, identified by appendiceal                         orifice and ileocecal valve. The colonoscopy was  performed without difficulty. The patient tolerated                         the procedure well. The quality of the bowel                         preparation was excellent. Findings:      Multiple small-mouthed diverticula were found in the sigmoid  colon,       descending colon, transverse colon, hepatic flexure and ascending colon.      The retroflexed view of the distal rectum and anal verge was normal and       showed no anal or rectal abnormalities. Impression:            - Diverticulosis in the sigmoid colon, in the                         descending colon, in the transverse colon, at the                         hepatic flexure and in the ascending colon.                        - The distal rectum and anal verge are normal on                         retroflexion view.                        - No specimens collected. Recommendation:        - High fiber diet indefinitely.                        - Repeat colonoscopy in 10 years for screening                         purposes. Procedure Code(s):     --- Professional ---                        [id number], Colonoscopy, flexible; diagnostic, including                         collection of specimen(s) by brushing or washing, when                         performed (separate procedure) Diagnosis Code(s):     --- Professional ---                        K57.30, Diverticulosis of large intestine without                         perforation or abscess without bleeding                        Z12.11, Encounter for screening for malignant neoplasm                         of colon CPT copyright 2019 American Medical Association. All rights reserved. The codes documented in this report are preliminary  and upon coder review may  be revised to meet current compliance requirements. Robert Bellow, MD 11/12/2019 7:57:31 AM This report has been signed electronically. Number of Addenda: 0 Note Initiated On: 11/12/2019 7:42 AM Scope Withdrawal Time: 0 hours 6 minutes 21 seconds  Total Procedure Duration: 0 hours 9 minutes 7 seconds  Estimated Blood Loss:  Estimated blood loss: none.      [facility]

## 2019-11-12 NOTE — Anesthesia Postprocedure Evaluation (Signed)
Anesthesia Post Note  Patient: Maria Barnes  Procedure(s) Performed: COLONOSCOPY WITH PROPOFOL (N/A )  Patient location during evaluation: Endoscopy Anesthesia Type: General Level of consciousness: awake and alert Pain management: pain level controlled Vital Signs Assessment: post-procedure vital signs reviewed and stable Respiratory status: spontaneous breathing and respiratory function stable Cardiovascular status: stable Anesthetic complications: no   No complications documented.   Last Vitals:  Vitals:   11/12/19 0821 11/12/19 0831  BP: (!) 143/77 (!) 156/92  Pulse:    Resp: 16   Temp:    SpO2:      Last Pain:  Vitals:   11/12/19 0831  TempSrc:   PainSc: 0-No pain                 Saed Hudlow K

## 2019-11-12 NOTE — Progress Notes (Signed)
   11/12/19 0735  Clinical Encounter Type  Visited With Family  Visit Type Initial  Referral From Chaplain  Consult/Referral To Chaplain  While rounding SDS waiting area, chaplain briefly spoke to Pt's husband and he said all was well. He did not have any questions or concerns.

## 2019-11-12 NOTE — Transfer of Care (Signed)
Immediate Anesthesia Transfer of Care Note  Patient: Maria Barnes  Procedure(s) Performed: COLONOSCOPY WITH PROPOFOL (N/A )  Patient Location: PACU  Anesthesia Type:General  Level of Consciousness: awake  Airway & Oxygen Therapy: Patient Spontanous Breathing and Patient connected to face mask oxygen  Post-op Assessment: Report given to RN  Post vital signs: Reviewed and stable  Last Vitals:  Vitals Value Taken Time  BP 125/81 11/12/19 0802  Temp    Pulse 92 11/12/19 0803  Resp 19 11/12/19 0803  SpO2 92 % 11/12/19 0803  Vitals shown include unvalidated device data.  Last Pain:  Vitals:   11/12/19 0700  TempSrc: Temporal  PainSc: 0-No pain         Complications: No complications documented.
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
## 2019-11-12 NOTE — Anesthesia Preprocedure Evaluation (Signed)
Anesthesia Evaluation  Patient identified by MRN, date of birth, ID band Patient awake    Reviewed: Allergy & Precautions, NPO status , Patient's Chart, lab work & pertinent test results  History of Anesthesia Complications (+) PONV, Family history of anesthesia reaction and history of anesthetic complications (PONV a long time ago, no problems since)  Airway Mallampati: II       Dental   Pulmonary neg sleep apnea, neg COPD, Not current smoker,           Cardiovascular hypertension, Pt. on medications + Past MI  (-) CHF (-) dysrhythmias (-) Valvular Problems/Murmurs     Neuro/Psych neg Seizures    GI/Hepatic Neg liver ROS, hiatal hernia, GERD  Medicated and Controlled,  Endo/Other  neg diabetes  Renal/GU negative Renal ROS     Musculoskeletal   Abdominal   Peds  Hematology   Anesthesia Other Findings   Reproductive/Obstetrics                             Anesthesia Physical Anesthesia Plan  ASA: II  Anesthesia Plan: General   Post-op Pain Management:    Induction: Intravenous  PONV Risk Score and Plan: 4 or greater and Propofol infusion, TIVA and Treatment may vary due to age or medical condition  Airway Management Planned: Nasal Cannula  Additional Equipment:   Intra-op Plan:   Post-operative Plan:   Informed Consent: I have reviewed the patients History and Physical, chart, labs and discussed the procedure including the risks, benefits and alternatives for the proposed anesthesia with the patient or authorized representative who has indicated his/her understanding and acceptance.       Plan Discussed with:   Anesthesia Plan Comments:         Anesthesia Quick Evaluation

## 2019-11-13 ENCOUNTER — Encounter: Payer: Self-pay | Admitting: General Surgery

## 2019-11-24 ENCOUNTER — Other Ambulatory Visit: Payer: Self-pay | Admitting: Oncology

## 2020-02-16 ENCOUNTER — Inpatient Hospital Stay: Payer: 59 | Admitting: Oncology

## 2020-02-27 ENCOUNTER — Encounter: Payer: Self-pay | Admitting: Oncology

## 2020-02-27 ENCOUNTER — Inpatient Hospital Stay: Payer: 59 | Attending: Oncology | Admitting: Oncology

## 2020-02-27 VITALS — BP 136/60 | HR 88 | Temp 98.4°F | Resp 16 | Ht 62.0 in | Wt 171.5 lb

## 2020-02-27 DIAGNOSIS — Z923 Personal history of irradiation: Secondary | ICD-10-CM | POA: Diagnosis not present

## 2020-02-27 DIAGNOSIS — Z79899 Other long term (current) drug therapy: Secondary | ICD-10-CM | POA: Insufficient documentation

## 2020-02-27 DIAGNOSIS — K219 Gastro-esophageal reflux disease without esophagitis: Secondary | ICD-10-CM | POA: Diagnosis not present

## 2020-02-27 DIAGNOSIS — C50912 Malignant neoplasm of unspecified site of left female breast: Secondary | ICD-10-CM | POA: Insufficient documentation

## 2020-02-27 DIAGNOSIS — Z79811 Long term (current) use of aromatase inhibitors: Secondary | ICD-10-CM | POA: Diagnosis not present

## 2020-02-27 DIAGNOSIS — Z170 Estrogen receptor positive status [ER+]: Secondary | ICD-10-CM | POA: Diagnosis not present

## 2020-02-27 DIAGNOSIS — I252 Old myocardial infarction: Secondary | ICD-10-CM | POA: Insufficient documentation

## 2020-02-27 DIAGNOSIS — I10 Essential (primary) hypertension: Secondary | ICD-10-CM | POA: Diagnosis not present

## 2020-02-27 DIAGNOSIS — Z08 Encounter for follow-up examination after completed treatment for malignant neoplasm: Secondary | ICD-10-CM

## 2020-02-27 DIAGNOSIS — Z7982 Long term (current) use of aspirin: Secondary | ICD-10-CM | POA: Diagnosis not present

## 2020-02-27 DIAGNOSIS — I471 Supraventricular tachycardia: Secondary | ICD-10-CM | POA: Diagnosis not present

## 2020-02-27 DIAGNOSIS — C50412 Malignant neoplasm of upper-outer quadrant of left female breast: Secondary | ICD-10-CM

## 2020-02-27 DIAGNOSIS — M8580 Other specified disorders of bone density and structure, unspecified site: Secondary | ICD-10-CM | POA: Insufficient documentation

## 2020-02-27 DIAGNOSIS — Z853 Personal history of malignant neoplasm of breast: Secondary | ICD-10-CM

## 2020-02-27 NOTE — Progress Notes (Signed)
Pt still has pain from axilla on left side

## 2020-03-05 ENCOUNTER — Encounter: Payer: Self-pay | Admitting: Oncology
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
## 2020-03-12 ENCOUNTER — Other Ambulatory Visit: Payer: Self-pay | Admitting: General Surgery

## 2020-03-12 DIAGNOSIS — C50412 Malignant neoplasm of upper-outer quadrant of left female breast: Secondary | ICD-10-CM

## 2020-03-12 DIAGNOSIS — Z170 Estrogen receptor positive status [ER+]: Secondary | ICD-10-CM

## 2020-03-26 ENCOUNTER — Other Ambulatory Visit: Payer: Self-pay | Admitting: Cardiovascular Disease

## 2020-03-26 ENCOUNTER — Telehealth: Payer: Self-pay

## 2020-03-26 MED ORDER — METOPROLOL TARTRATE 50 MG PO TABS: 50.0000 mg | ORAL_TABLET | Freq: Two times a day (BID) | ORAL | 0 refills | Status: DC

## 2020-03-26 NOTE — Addendum Note (Signed)
Addended by: Raelene Bott, BRANDY L on: 03/26/2020 12:58 PM   Modules accepted: Orders

## 2020-03-26 NOTE — Telephone Encounter (Signed)
Patient is returning your call.  

## 2020-03-26 NOTE — Telephone Encounter (Signed)
[facility] requesting clarification on directions for metoprolol. Rx sig contains two sets of conflicting directions: Take 1 tablet (50mg ) PO BID Taking 1 tab daily  Left message for patient requesting to have er call back and clarify how she is taking it. Last office note from Dr. Rockey Situ on 03/31/19 states patient is to be taking twice daily.

## 2020-03-26 NOTE — Telephone Encounter (Signed)
Requested Prescriptions   Signed Prescriptions Disp Refills   metoprolol tartrate (LOPRESSOR) 50 MG tablet 180 tablet 0    Sig: Take 1 tablet (50 mg total) by mouth 2 (two) times daily.    Authorizing Provider: Minna Merritts    Ordering User: Raelene Bott, Braxton Weisbecker L   Spoke with patient who states she is taking metoprolol BID as directed.

## 2020-04-04 NOTE — Progress Notes (Unsigned)
Cardiology Office Note  Date:  04/05/2020   ID:  Maria Barnes, Maria Barnes Jan 21, 1956, MRN [id number]  PCP:  Maria Pink, MD   Chief Complaint  Patient presents with  . Other    12 month f/u no complaints today. Meds reviewed verbally with pt.    HPI:  65 year old woman with  Past medical history of SVT  hypertension  lumpectomy for breast cancer,  substernal chest pain, diaphoresis sweating , troponins up to 1.5 CTA chest was performed and was neg for PE.  Cardiac cath 2019 with suspected coronary dissection of the mid to distal LAD with severe stenosis/tapering mid LAD to apical region Severe apical and periapical akinesis. Mildly depressed EF, 45 to 50% Who presents for follow-up of her coronary artery disease , dissection  LOV 03/2019  Does not work, retired Has 3 grandchildren, takes to school  No exercise, Son moving in, 3 children  Denies any significant chest pain or shortness of breath on exertion Denies any tachypalpitations concerning for SVT  Labs reviewed Total chol 130, LDL53 HAB1C 6.1  Prior oncology hx  stage Ia left breast cancer status post lumpectomy and sentinel lymph node biopsy in May 2019. Pathology showed 2 adjacent foci of invasive mammary carcinoma. Largest focus was 11 mm and grade 1. There was intermediate grade DCIS with focal necrosis spanning 2.4 cm. Tumor was greater than 90% ER PR positive and HER-2/neu negative Oncotype DX recurrence score was 4 and she did not require adjuvant chemotherapy. She completed postlumpectomy radiation and was started on Femara  Father passed 12/2018 Esophageal cancer  EKG personally reviewed by myself on todays visit Shows normal sinus rhythm with rate 73 bpm no significant ST or T wave changes  Records from the hospital [facility] 06/22/17:   Mid LAD to Dist LAD lesion is 80% stenosed.  The left ventricular ejection fraction is 45-50% by visual estimate.  LV end diastolic pressure is normal.  There is mild  left ventricular systolic dysfunction.  There is no mitral valve regurgitation.   Echo 06/22/17: - Left ventricle: The cavity size was normal. Systolic function wasnormal. The estimated ejection fraction was in the range of 50% to 55%. Select images with at least moderate hypokinesis in theapical, periapical and apical septal region Doppler parameters are consistent with abnormal left ventricular relaxation (grade 1 diastolic dysfunction). - Left atrium: The atrium was normal in size. - Right ventricle: Systolic function was normal. - Pulmonary arteries: Systolic pressure was within the normal range.  PMH:   has a past medical history of Atypical chest pain, Breast cancer ([place]), Breast cancer of upper-outer quadrant of left female breast ([place]) (05/30/2017), Colon polyps, Complication of anesthesia, Concussion (7829), Diastolic dysfunction, Family history of adverse reaction to anesthesia, GERD (gastroesophageal reflux disease), History of hiatal hernia, Hypertension, Migraine, Osteopenia, Osteopenia, Personal history of radiation therapy, PONV (postoperative nausea and vomiting), Shingles, Spontaneous dissection of coronary artery (06/21/2017), SVT (supraventricular tachycardia) ([place]), VAIN I (vaginal intraepithelial neoplasia grade I), and Varicose veins.  PSH:    Past Surgical History:  Procedure Laterality Date  . BREAST BIOPSY Left 05/01/2017    x 2 areas.  2:00 6cmfn irregular mass wing clip.  2:00 6cmfn round mass venus clip. DUCTAL CARCINOMA IN SITU and INVASIVE MAMMARY CARCINOMA  . BREAST BIOPSY Left 05/10/2017   affirm bx with  X marker  PSEUDO-ANGIOMATOUS STROMAL HYPERPLASIA /Upper outer Quad  . BREAST BIOPSY Left 2020   [facility]'s office- 3 oclock- fibrosis and fat necrosis  . BREAST LUMPECTOMY  Left 05/30/2017   lumpectomy of wing and venus markers, invasive mammary carcinoma and DCIS  . BREAST LUMPECTOMY WITH SENTINEL LYMPH NODE BIOPSY Left 05/30/2017   Procedure: BREAST  LUMPECTOMY WITH SENTINEL LYMPH NODE BX,NEEDLE LOCALIZATION;  Surgeon: Maria Bellow, MD;  Location: [facility] ORS;  Service: General;  Laterality: Left;  . cervical cryosurgery  1980  . COLONOSCOPY  2011  . COLONOSCOPY WITH PROPOFOL N/A 11/12/2019   Procedure: COLONOSCOPY WITH PROPOFOL;  Surgeon: Maria Bellow, MD;  Location: [facility] ENDOSCOPY;  Service: Endoscopy;  Laterality: N/A;  . DILATION AND CURETTAGE OF UTERUS     x 2  . LEFT HEART CATH AND CORONARY ANGIOGRAPHY N/A 06/22/2017   Procedure: LEFT HEART CATH AND CORONARY ANGIOGRAPHY;  Surgeon: Maria Merritts, MD;  Location: [place] [facility];  Service: Cardiovascular;  Laterality: N/A;  . MASTECTOMY     partial lumpectomy left  . TUBAL LIGATION    . VAGINAL HYSTERECTOMY     tvh    Current Outpatient Medications  Medication Sig Dispense Refill  . acetaminophen (TYLENOL) 500 MG tablet Take 1,000 mg by mouth every 6 (six) hours as needed (for pain/headaches.).    Marland Kitchen aspirin 81 MG EC tablet Take 1 tablet (81 mg total) by mouth daily. 30 tablet 0  . atorvastatin (LIPITOR) 80 MG tablet Take 1 tablet (80 mg total) by mouth daily at 6 PM. 90 tablet 4  . calcium-vitamin D (OSCAL WITH D) 500-200 MG-UNIT tablet Take 2 tablets by mouth daily with breakfast.     . letrozole (FEMARA) 2.5 MG tablet TAKE 1 TABLET BY MOUTH EVERY DAY 90 tablet 1  . metoprolol tartrate (LOPRESSOR) 50 MG tablet Take 1 tablet (50 mg total) by mouth 2 (two) times daily. 180 tablet 0  . Multiple Vitamin (MULTIVITAMIN WITH MINERALS) TABS tablet Take 1 tablet by mouth daily.    . nitroGLYCERIN (NITROSTAT) 0.4 MG SL tablet Place 1 tablet (0.4 mg total) under the tongue every 5 (five) minutes as needed for chest pain. 25 tablet 3  . pantoprazole (PROTONIX) 40 MG tablet Take 40 mg by mouth daily.  5  . vitamin C (ASCORBIC ACID) 500 MG tablet Take 500 mg by mouth daily.     No current facility-administered medications for this visit.    Allergies:   Patient has no known  allergies.   Social History:  The patient  reports that she has never smoked. She has never used smokeless tobacco. She reports that she does not drink alcohol and does not use drugs.   Family History:   family history includes Anxiety disorder in her brother; Breast cancer (age of onset: 74) in her paternal aunt; Breast cancer (age of onset: 12) in her mother; Cancer in her paternal aunt; Diabetes in her father; Esophageal cancer (age of onset: 69) in her father; Hypertension in her brother, brother, and father; Stomach cancer in her paternal grandfather; Thyroid disease in her father.    Review of Systems: Review of Systems  Constitutional: Negative.   Respiratory: Negative.   Cardiovascular: Positive for palpitations.  Gastrointestinal: Negative.   Musculoskeletal: Negative.   Neurological: Negative.   Psychiatric/Behavioral: Negative.   All other systems reviewed and are negative.   PHYSICAL EXAM: VS:  BP 118/78 (BP Location: Left Arm, Patient Position: Sitting, Cuff Size: Normal)   Pulse 73   Ht 5' 2"  (1.575 m)   Wt 171 lb 4 oz (77.7 kg)   SpO2 98%   BMI 31.32 kg/m  ,  BMI Body mass index is 31.32 kg/m. Constitutional:  oriented to person, place, and time. No distress.  HENT:  Head: Grossly normal Eyes:  no discharge. No scleral icterus.  Neck: No JVD, no carotid bruits  Cardiovascular: Regular rate and rhythm, no murmurs appreciated Pulmonary/Chest: Clear to auscultation bilaterally, no wheezes or rails Abdominal: Soft.  no distension.  no tenderness.  Musculoskeletal: Normal range of motion Neurological:  normal muscle tone. Coordination normal. No atrophy Skin: Skin warm and dry Psychiatric: normal affect, pleasant   Recent Labs: No results found for requested labs within last 8760 hours.    Lipid Panel Lab Results  Component Value Date   CHOL 114 10/09/2017   HDL 39 (L) 10/09/2017   LDLCALC 52 10/09/2017   TRIG 117 10/09/2017      Wt Readings from Last  3 Encounters:  04/05/20 171 lb 4 oz (77.7 kg)  02/27/20 171 lb 8 oz (77.8 kg)  11/12/19 168 lb (76.2 kg)     ASSESSMENT AND PLAN:  NSTEMI (non-ST elevated myocardial infarction) ([place]) -   spontaneous coronary dissection  echocardiogram with minimally depressed ejection fraction and region of hypokinesis in the mid to distal LAD territory No angina  Essential (primary) hypertension  metoprolol tartrate 50 twice daily Doing well  Supraventricular tachycardia (HCC) No recent episodes of SVT metoprolol tartrate 50 twice daily  No epsiodes  Breast cancer Completed radiation treatment, on the left On maintenance medication, letrazole 2019 stable  Coronary artery dissection Cholesterol is at goal on the current lipid regimen. No changes to the medications were made.    Total encounter time more than 25 minutes  Greater than 50% was spent in counseling and coordination of care with the patient    No orders of the defined types were placed in this encounter.    Signed, Esmond Plants, M.D., Ph.D. 04/05/2020  [place], [place]

## 2020-04-05 ENCOUNTER — Encounter: Payer: Self-pay | Admitting: Cardiovascular Disease

## 2020-04-05 ENCOUNTER — Ambulatory Visit (INDEPENDENT_AMBULATORY_CARE_PROVIDER_SITE_OTHER): Payer: 59 | Admitting: Cardiovascular Disease

## 2020-04-05 ENCOUNTER — Other Ambulatory Visit: Payer: Self-pay

## 2020-04-05 VITALS — BP 118/78 | HR 73 | Ht 62.0 in | Wt 171.2 lb

## 2020-04-05 DIAGNOSIS — E782 Mixed hyperlipidemia: Secondary | ICD-10-CM

## 2020-04-05 DIAGNOSIS — I2542 Coronary artery dissection: Secondary | ICD-10-CM

## 2020-04-05 DIAGNOSIS — I471 Supraventricular tachycardia: Secondary | ICD-10-CM | POA: Diagnosis not present

## 2020-04-05 DIAGNOSIS — I25118 Atherosclerotic heart disease of native coronary artery with other forms of angina pectoris: Secondary | ICD-10-CM | POA: Diagnosis not present

## 2020-04-05 DIAGNOSIS — I10 Essential (primary) hypertension: Secondary | ICD-10-CM

## 2020-04-05 NOTE — Patient Instructions (Signed)
Medication Instructions:  No changes  If you need a refill on your cardiac medications before your next appointment, please call your pharmacy.    Lab work: No new labs needed   If you have labs (blood work) drawn today and your tests are completely normal, you will receive your results only by: . MyChart Message (if you have MyChart) OR . A paper copy in the mail If you have any lab test that is abnormal or we need to change your treatment, we will call you to review the results.   Testing/Procedures: No new testing needed   Follow-Up: At [facility], you and your health needs are our priority.  As part of our continuing mission to provide you with exceptional heart care, we have created designated Provider Care Teams.  These Care Teams include your primary Cardiologist (physician) and Advanced Practice Providers (APPs -  Physician Assistants and Nurse Practitioners) who all work together to provide you with the care you need, when you need it.  . You will need a follow up appointment in 12 months  . Providers on your designated Care Team:   . Christopher Berge, NP . Ryan Dunn, PA-C . Jacquelyn Visser, PA-C  Any Other Special Instructions Will Be Listed Below (If Applicable).  COVID-19 Vaccine Information can be found at: [email or web address] For questions related to vaccine distribution or appointments, please email [email or web address] or call [phone].     

## 2020-04-19 ENCOUNTER — Other Ambulatory Visit: Payer: Self-pay

## 2020-04-19 ENCOUNTER — Ambulatory Visit
Admission: RE | Admit: 2020-04-19 | Discharge: 2020-04-19 | Disposition: A | Discharge status: Home / Self Care | Payer: 59 | Source: Ambulatory Visit | Attending: General Surgery | Admitting: General Surgery

## 2020-04-19 DIAGNOSIS — C50412 Malignant neoplasm of upper-outer quadrant of left female breast: Secondary | ICD-10-CM

## 2020-04-19 DIAGNOSIS — Z853 Personal history of malignant neoplasm of breast: Secondary | ICD-10-CM | POA: Insufficient documentation

## 2020-04-19 DIAGNOSIS — Z1231 Encounter for screening mammogram for malignant neoplasm of breast: Secondary | ICD-10-CM | POA: Diagnosis present

## 2020-04-19 DIAGNOSIS — Z170 Estrogen receptor positive status [ER+]: Secondary | ICD-10-CM

## 2020-05-20 ENCOUNTER — Other Ambulatory Visit: Payer: Self-pay | Admitting: Oncology

## 2020-06-21 ENCOUNTER — Other Ambulatory Visit: Payer: Self-pay | Admitting: Cardiovascular Disease

## 2020-08-24 ENCOUNTER — Telehealth: Payer: Self-pay | Admitting: Oncology

## 2020-08-24 NOTE — Telephone Encounter (Signed)
Called patient to r/s her appt on 7/28. Patient was agreeable to new day/time. Sending updated appt reminder in the mail.

## 2020-08-27 ENCOUNTER — Inpatient Hospital Stay: Payer: Self-pay | Admitting: Oncology

## 2020-08-27 ENCOUNTER — Inpatient Hospital Stay: Payer: Self-pay

## 2020-09-17 ENCOUNTER — Inpatient Hospital Stay (HOSPITAL_BASED_OUTPATIENT_CLINIC_OR_DEPARTMENT_OTHER): Payer: Medicare Other | Admitting: Hospice and Palliative Medicine

## 2020-09-17 ENCOUNTER — Inpatient Hospital Stay: Payer: Medicare Other | Attending: Oncology

## 2020-09-17 VITALS — BP 142/52 | HR 83 | Temp 98.9°F | Wt 164.0 lb

## 2020-09-17 DIAGNOSIS — Z79811 Long term (current) use of aromatase inhibitors: Secondary | ICD-10-CM | POA: Diagnosis not present

## 2020-09-17 DIAGNOSIS — C50912 Malignant neoplasm of unspecified site of left female breast: Secondary | ICD-10-CM | POA: Insufficient documentation

## 2020-09-17 DIAGNOSIS — Z170 Estrogen receptor positive status [ER+]: Secondary | ICD-10-CM | POA: Diagnosis present

## 2020-09-17 DIAGNOSIS — Z08 Encounter for follow-up examination after completed treatment for malignant neoplasm: Secondary | ICD-10-CM

## 2020-09-17 DIAGNOSIS — Z853 Personal history of malignant neoplasm of breast: Secondary | ICD-10-CM

## 2020-09-17 DIAGNOSIS — Z923 Personal history of irradiation: Secondary | ICD-10-CM | POA: Insufficient documentation

## 2020-09-17 LAB — CBC WITH DIFFERENTIAL/PLATELET
Abs Immature Granulocytes: 0.01 10*3/uL (ref 0.00–0.07)
Basophils Absolute: 0 10*3/uL (ref 0.0–0.1)
Basophils Relative: 1 %
Eosinophils Absolute: 0.1 10*3/uL (ref 0.0–0.5)
Eosinophils Relative: 3 %
HCT: 40.6 % (ref 36.0–46.0)
Hemoglobin: 13.3 g/dL (ref 12.0–15.0)
Immature Granulocytes: 0 %
Lymphocytes Relative: 23 %
Lymphs Abs: 1.3 10*3/uL (ref 0.7–4.0)
MCH: 31 pg (ref 26.0–34.0)
MCHC: 32.8 g/dL (ref 30.0–36.0)
MCV: 94.6 fL (ref 80.0–100.0)
Monocytes Absolute: 0.5 10*3/uL (ref 0.1–1.0)
Monocytes Relative: 8 %
Neutro Abs: 3.5 10*3/uL (ref 1.7–7.7)
Neutrophils Relative %: 65 %
Platelets: 245 10*3/uL (ref 150–400)
RBC: 4.29 MIL/uL (ref 3.87–5.11)
RDW: 13.4 % (ref 11.5–15.5)
WBC: 5.4 10*3/uL (ref 4.0–10.5)
nRBC: 0 % (ref 0.0–0.2)

## 2020-09-17 LAB — COMPREHENSIVE METABOLIC PANEL
ALT: 31 U/L (ref 0–44)
AST: 38 U/L (ref 15–41)
Albumin: 4.4 g/dL (ref 3.5–5.0)
Alkaline Phosphatase: 71 U/L (ref 38–126)
Anion gap: 8 (ref 5–15)
BUN: 18 mg/dL (ref 8–23)
CO2: 28 mmol/L (ref 22–32)
Calcium: 9.5 mg/dL (ref 8.9–10.3)
Chloride: 104 mmol/L (ref 98–111)
Creatinine, Ser: 0.86 mg/dL (ref 0.44–1.00)
GFR, Estimated: >60 mL/min (ref >60)
Glucose, Bld: 120 mg/dL — ABNORMAL HIGH (ref 70–99)
Potassium: 4 mmol/L (ref 3.5–5.1)
Sodium: 140 mmol/L (ref 135–145)
Total Bilirubin: 1.2 mg/dL (ref 0.3–1.2)
Total Protein: 7.8 g/dL (ref 6.5–8.1)
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
## 2020-10-01 ENCOUNTER — Encounter: Payer: 59 | Admitting: Obstetrics and Gynecology

## 2020-10-07 ENCOUNTER — Encounter: Payer: Self-pay | Admitting: Obstetrics and Gynecology

## 2020-10-07 ENCOUNTER — Ambulatory Visit (INDEPENDENT_AMBULATORY_CARE_PROVIDER_SITE_OTHER): Payer: Medicare Other | Admitting: Obstetrics and Gynecology

## 2020-10-07 ENCOUNTER — Other Ambulatory Visit: Payer: Self-pay

## 2020-10-07 VITALS — BP 125/79 | HR 73 | Resp 16 | Ht 62.5 in | Wt 162.2 lb

## 2020-10-07 DIAGNOSIS — N8111 Cystocele, midline: Secondary | ICD-10-CM

## 2020-10-07 DIAGNOSIS — Z01419 Encounter for gynecological examination (general) (routine) without abnormal findings: Secondary | ICD-10-CM

## 2020-10-07 DIAGNOSIS — Z853 Personal history of malignant neoplasm of breast: Secondary | ICD-10-CM

## 2020-10-07 NOTE — Progress Notes (Signed)
HPI:      [name] is a 65 y.o. [phone] who LMP was No LMP recorded. Patient has had a hysterectomy.  Subjective:   She presents today for her annual examination.  She reports that she is doing well.  She gets regular follow-up for her heart and left sided breast cancer.  She remains on letrozole and has "2 years left".  She is followed by Dr. Bary Castilla for her history of breast cancer.  Her son and her family including teenagers and dogs are now living with her and she states this is somewhat stressful but also exciting. She has been exercising more regularly and has undergone intended weight loss!! She continues to take calcium daily. She is being followed by her primary care physician for possible "prediabetes". Patient has had left sided breast cancer and a concurrent heart attack 3 years ago.  She had radiotherapy and is currently taking hormone therapy for her breast cancer. She has previously had a hysterectomy.    Hx: The following portions of the patient's history were reviewed and updated as appropriate:             She  has a past medical history of Atypical chest pain, Breast cancer ([place]), Breast cancer of upper-outer quadrant of left female breast ([place]) (05/30/2017), Colon polyps, Complication of anesthesia, Concussion ([id number]), Diastolic dysfunction, Family history of adverse reaction to anesthesia, GERD (gastroesophageal reflux disease), History of hiatal hernia, Hypertension, Migraine, Osteopenia, Osteopenia, Personal history of radiation therapy, PONV (postoperative nausea and vomiting), Shingles, Spontaneous dissection of coronary artery (06/21/2017), SVT (supraventricular tachycardia) ([place]), VAIN I (vaginal intraepithelial neoplasia grade I), and Varicose veins. She does not have any pertinent problems on file. She  has a past surgical history that includes Tubal ligation; Dilation and curettage of uterus; cervical cryosurgery (1980); Colonoscopy (2011); Vaginal hysterectomy;  Breast lumpectomy (Left, 05/30/2017); Breast lumpectomy with sentinel lymph node bx (Left, 05/30/2017); LEFT HEART CATH AND CORONARY ANGIOGRAPHY (N/A, 06/22/2017); Breast biopsy (Left, 05/01/2017); Breast biopsy (Left, 05/10/2017); Breast biopsy (Left, 2020); Mastectomy; and Colonoscopy with propofol (N/A, 11/12/2019). Her family history includes Anxiety disorder in her brother; Breast cancer (age of onset: 19) in her paternal aunt; Breast cancer (age of onset: 73) in her mother; Cancer in her paternal aunt; Diabetes in her father; Esophageal cancer (age of onset: 42) in her father; Hypertension in her brother, brother, and father; Stomach cancer in her paternal grandfather; Thyroid disease in her father. She  reports that she has never smoked. She has never used smokeless tobacco. She reports that she does not drink alcohol and does not use drugs. She has a current medication list which includes the following prescription(s): acetaminophen, aspirin, atorvastatin, calcium-vitamin d, letrozole, metoprolol tartrate, multivitamin with minerals, pantoprazole, polyethylene glycol powder, vitamin c, and nitroglycerin. She has no active allergies.       Review of Systems:  Review of Systems  Constitutional: Denied constitutional symptoms, night sweats, recent illness, fatigue, fever, insomnia and weight loss.  Eyes: Denied eye symptoms, eye pain, photophobia, vision change and visual disturbance.  Ears/Nose/Throat/Neck: Denied ear, nose, throat or neck symptoms, hearing loss, nasal discharge, sinus congestion and sore throat.  Cardiovascular: Denied cardiovascular symptoms, arrhythmia, chest pain/pressure, edema, exercise intolerance, orthopnea and palpitations.  Respiratory: Denied pulmonary symptoms, asthma, pleuritic pain, productive sputum, cough, dyspnea and wheezing.  Gastrointestinal: Denied, gastro-esophageal reflux, melena, nausea and vomiting.  Genitourinary: Denied genitourinary symptoms including  symptomatic vaginal discharge, pelvic relaxation issues, and urinary complaints.  Musculoskeletal: Denied musculoskeletal symptoms,  stiffness, swelling, muscle weakness and myalgia.  Dermatologic: Denied dermatology symptoms, rash and scar.  Neurologic: Denied neurology symptoms, dizziness, headache, neck pain and syncope.  Psychiatric: Denied psychiatric symptoms, anxiety and depression.  Endocrine: Denied endocrine symptoms including hot flashes and night sweats.   Meds:   Current Outpatient Medications on File Prior to Visit  Medication Sig Dispense Refill   acetaminophen (TYLENOL) 500 MG tablet Take 1,000 mg by mouth every 6 (six) hours as needed (for pain/headaches.).     aspirin 81 MG EC tablet Take 1 tablet (81 mg total) by mouth daily. 30 tablet 0   atorvastatin (LIPITOR) 80 MG tablet Take 1 tablet (80 mg total) by mouth daily at 6 PM. 90 tablet 4   calcium-vitamin D (OSCAL WITH D) 500-200 MG-UNIT tablet Take 2 tablets by mouth daily with breakfast.      letrozole (FEMARA) 2.5 MG tablet TAKE 1 TABLET BY MOUTH EVERY DAY 90 tablet 1   metoprolol tartrate (LOPRESSOR) 50 MG tablet TAKE 1 TABLET BY MOUTH TWICE A DAY 180 tablet 3   Multiple Vitamin (MULTIVITAMIN WITH MINERALS) TABS tablet Take 1 tablet by mouth daily.     pantoprazole (PROTONIX) 40 MG tablet Take 40 mg by mouth daily.  5   polyethylene glycol powder (GLYCOLAX/MIRALAX) 17 GM/SCOOP powder One bottle for colonoscopy prep. Use as directed.     vitamin C (ASCORBIC ACID) 500 MG tablet Take 500 mg by mouth daily.     nitroGLYCERIN (NITROSTAT) 0.4 MG SL tablet Place 1 tablet (0.4 mg total) under the tongue every 5 (five) minutes as needed for chest pain. 25 tablet 3   No current facility-administered medications on file prior to visit.       Objective:     Vitals:   10/07/20 0911  BP: 125/79  Pulse: 73  Resp: 16    Filed Weights   10/07/20 0911  Weight: 162 lb 3.2 oz (73.6 kg)              Physical  examination General NAD, Conversant  HEENT Atraumatic; Op clear with mmm.  Normo-cephalic. Pupils reactive. Anicteric sclerae  Thyroid/Neck Smooth without nodularity or enlargement. Normal ROM.  Neck Supple.  Skin No rashes, lesions or ulceration. Normal palpated skin turgor. No nodularity.  Breasts: No masses or discharge.  Symmetric.  No axillary adenopathy.  Left breast scar noted with skin thickening/radiation changes?  Lungs: Clear to auscultation.No rales or wheezes. Normal Respiratory effort, no retractions.  Heart: NSR.  No murmurs or rubs appreciated. No periferal edema  Abdomen: Soft.  Non-tender.  No masses.  No HSM. No hernia  Extremities: Moves all appropriately.  Normal ROM for age. No lymphadenopathy.  Neuro: Oriented to PPT.  Normal mood. Normal affect.     Pelvic:   Vulva: Normal appearance.  No lesions.   Vagina: No lesions or abnormalities noted.  Support: Second-degree cystocele  Urethra No masses tenderness or scarring.  Meatus Normal size without lesions or prolapse.  Cervix: Surgically absent   Anus: Normal exam.  No lesions.  Perineum: Normal exam.  No lesions.        Bimanual   Uterus: Surgically absent   Adnexae: No masses.  Non-tender to palpation.  Cul-de-sac: Negative for abnormality.     Assessment:    [id number] Patient Active Problem List   Diagnosis Date Noted   Mixed hyperlipidemia 03/21/2018   Malignant neoplasm of upper-outer quadrant of left breast in female, estrogen receptor positive ([place]) 09/18/2017   Vaginal atrophy  09/18/2017   Stress incontinence, female 09/18/2017   Chest pain    Coronary artery dissection    Non-ST elevation myocardial infarction (NSTEMI), subsequent episode of care ([place]) 06/21/2017   Goals of care, counseling/discussion 05/18/2017   Osteopenia 05/14/2017   Primary cancer of upper outer quadrant of left breast ([place]) 05/13/2017   Menopausal state 08/26/2015   Status post vaginal hysterectomy 08/26/2015   VIN  (vulval intraepithelial neoplasia) I 08/26/2015   Acid reflux 08/25/2014   H/O varicella 08/25/2014   LBP (low back pain) 08/25/2014   Headache, migraine 08/25/2014   Herpes zona 08/25/2014   Supraventricular tachycardia ([place]) 08/25/2014   Essential hypertension 08/25/2014   Avitaminosis D 08/25/2014   Enthesopathy of hip 08/25/2014     1. Well woman exam with routine gynecological exam   2. Cystocele, midline   3. History of breast cancer     Patient doing well.  She reports she is generally asymptomatic with her cystocele.   Plan:            1.  Basic Screening Recommendations The basic screening recommendations for asymptomatic women were discussed with the patient during her visit.  The age-appropriate recommendations were discussed with her and the rational for the tests reviewed.  When I am informed by the patient that another primary care physician has previously obtained the age-appropriate tests and they are up-to-date, only outstanding tests are ordered and referrals given as necessary.  Abnormal results of tests will be discussed with her when all of her results are completed.  Routine preventative health maintenance measures emphasized: Exercise/Diet/Weight control, Tobacco Warnings, Alcohol/Substance use risks and Stress Management  Orders No orders of the defined types were placed in this encounter.   No orders of the defined types were placed in this encounter.         F/U  Return in about 1 week (around 10/14/2020) for Annual Physical.  Finis Bud, M.D. 10/07/2020 9:50 AM

## 2020-11-01 ENCOUNTER — Ambulatory Visit: Payer: Medicare Other | Admitting: Radiation Oncology

## 2020-12-06 ENCOUNTER — Encounter: Payer: Self-pay | Admitting: Radiation Oncology

## 2020-12-06 ENCOUNTER — Other Ambulatory Visit: Payer: Self-pay

## 2020-12-06 ENCOUNTER — Ambulatory Visit
Admission: RE | Admit: 2020-12-06 | Discharge: 2020-12-06 | Disposition: A | Discharge status: Home / Self Care | Payer: Medicare Other | Source: Ambulatory Visit | Attending: Radiation Oncology | Admitting: Radiation Oncology

## 2020-12-06 ENCOUNTER — Other Ambulatory Visit: Payer: Self-pay | Admitting: *Deleted

## 2020-12-06 VITALS — BP 102/75 | HR 80 | Temp 97.6°F | Resp 16 | Wt 155.7 lb

## 2020-12-06 DIAGNOSIS — Z170 Estrogen receptor positive status [ER+]: Secondary | ICD-10-CM | POA: Insufficient documentation

## 2020-12-06 DIAGNOSIS — Z79811 Long term (current) use of aromatase inhibitors: Secondary | ICD-10-CM | POA: Insufficient documentation

## 2020-12-06 DIAGNOSIS — C50412 Malignant neoplasm of upper-outer quadrant of left female breast: Secondary | ICD-10-CM | POA: Insufficient documentation

## 2020-12-06 DIAGNOSIS — Z923 Personal history of irradiation: Secondary | ICD-10-CM | POA: Insufficient documentation

## 2020-12-06 MED ORDER — LETROZOLE 2.5 MG PO TABS: 2.5000 mg | ORAL_TABLET | Freq: Every day | ORAL | 1 refills | Status: DC

## 2020-12-06 NOTE — Progress Notes (Signed)
Radiation Oncology Follow up Note  Name: Maria Barnes   Date:   12/06/2020 MRN:  [id number] DOB: 03-11-1955    This 65 y.o. female presents to the clinic today for 3-year follow-up status post whole breast radiation to her left breast for stage I ER/PR positive invasive mammary carcinoma.  REFERRING PROVIDER: Maryland Pink, MD  HPI: Patient is a 65 year old female now out 3 years having completed whole breast radiation to her left breast for stage I ER/PR positive invasive mammary carcinoma.  Seen today in routine follow-up she is doing well.  She specifically denies breast tenderness cough or bone pain..  She had mammograms back in March which I have reviewed were BI-RADS 1 negative.  She is currently on Femara tolerating it well without side effect.  COMPLICATIONS OF TREATMENT: none  FOLLOW UP COMPLIANCE: keeps appointments   PHYSICAL EXAM:  BP 102/75 (BP Location: Left Arm, Patient Position: Sitting)   Pulse 80   Temp 97.6 F (36.4 C) (Tympanic)   Resp 16   Wt 155 lb 11.2 oz (70.6 kg)   BMI 28.02 kg/m  Lungs are clear to A&P cardiac examination essentially unremarkable with regular rate and rhythm. No dominant mass or nodularity is noted in either breast in 2 positions examined. Incision is well-healed. No axillary or supraclavicular adenopathy is appreciated. Cosmetic result is excellent.  Well-developed well-nourished patient in NAD. HEENT reveals PERLA, EOMI, discs not visualized.  Oral cavity is clear. No oral mucosal lesions are identified. Neck is clear without evidence of cervical or supraclavicular adenopathy. Lungs are clear to A&P. Cardiac examination is essentially unremarkable with regular rate and rhythm without murmur rub or thrill. Abdomen is benign with no organomegaly or masses noted. Motor sensory and DTR levels are equal and symmetric in the upper and lower extremities. Cranial nerves II through XII are grossly intact. Proprioception is intact. No peripheral adenopathy  or edema is identified. No motor or sensory levels are noted. Crude visual fields are within normal range.  RADIOLOGY RESULTS: Mammograms reviewed compatible with above-stated findings  PLAN: Present time patient is doing well with no evidence of disease 3 years out.  She continues on Femara without side effect..  I have asked to see her back in 1 year and then I am going to discontinue follow-up care.  Patient knows to call with any concerns at any time.  I would like to take this opportunity to thank you for allowing me to participate in the care of your patient.Noreene Filbert, MD

## 2020-12-06 NOTE — Telephone Encounter (Signed)
Patient requests refill for letozole order pended per MD approval.

## 2021-03-07 ENCOUNTER — Other Ambulatory Visit: Payer: Self-pay | Admitting: Family Medicine

## 2021-03-07 DIAGNOSIS — Z1231 Encounter for screening mammogram for malignant neoplasm of breast: Secondary | ICD-10-CM

## 2021-03-14 ENCOUNTER — Other Ambulatory Visit: Payer: Self-pay | Admitting: General Surgery

## 2021-03-14 DIAGNOSIS — C50412 Malignant neoplasm of upper-outer quadrant of left female breast: Secondary | ICD-10-CM

## 2021-03-14 DIAGNOSIS — Z170 Estrogen receptor positive status [ER+]: Secondary | ICD-10-CM

## 2021-03-18 ENCOUNTER — Other Ambulatory Visit: Payer: Self-pay

## 2021-03-18 ENCOUNTER — Inpatient Hospital Stay: Payer: Medicare Other | Attending: Oncology | Admitting: Nurse Practitioner

## 2021-03-18 ENCOUNTER — Inpatient Hospital Stay: Payer: Medicare Other | Admitting: Oncology

## 2021-03-18 VITALS — BP 148/65 | HR 62 | Temp 97.1°F | Resp 16 | Wt 151.4 lb

## 2021-03-18 DIAGNOSIS — Z853 Personal history of malignant neoplasm of breast: Secondary | ICD-10-CM

## 2021-03-18 DIAGNOSIS — Z79811 Long term (current) use of aromatase inhibitors: Secondary | ICD-10-CM | POA: Diagnosis not present

## 2021-03-18 DIAGNOSIS — C50412 Malignant neoplasm of upper-outer quadrant of left female breast: Secondary | ICD-10-CM | POA: Diagnosis not present

## 2021-03-18 DIAGNOSIS — Z7982 Long term (current) use of aspirin: Secondary | ICD-10-CM | POA: Insufficient documentation

## 2021-03-18 DIAGNOSIS — Z923 Personal history of irradiation: Secondary | ICD-10-CM | POA: Insufficient documentation

## 2021-03-18 DIAGNOSIS — Z08 Encounter for follow-up examination after completed treatment for malignant neoplasm: Secondary | ICD-10-CM

## 2021-03-18 DIAGNOSIS — M8580 Other specified disorders of bone density and structure, unspecified site: Secondary | ICD-10-CM | POA: Diagnosis not present

## 2021-03-18 DIAGNOSIS — Z79899 Other long term (current) drug therapy: Secondary | ICD-10-CM | POA: Insufficient documentation

## 2021-03-18 DIAGNOSIS — Z5181 Encounter for therapeutic drug level monitoring: Secondary | ICD-10-CM | POA: Diagnosis not present

## 2021-03-18 DIAGNOSIS — Z170 Estrogen receptor positive status [ER+]: Secondary | ICD-10-CM | POA: Insufficient documentation
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
## 2021-04-05 ENCOUNTER — Encounter: Payer: Self-pay | Admitting: Cardiovascular Disease

## 2021-04-05 ENCOUNTER — Other Ambulatory Visit: Payer: Self-pay

## 2021-04-05 ENCOUNTER — Ambulatory Visit: Payer: Medicare Other | Admitting: Cardiovascular Disease

## 2021-04-05 VITALS — BP 138/79 | HR 67 | Ht 62.0 in | Wt 151.1 lb

## 2021-04-05 DIAGNOSIS — I10 Essential (primary) hypertension: Secondary | ICD-10-CM | POA: Diagnosis not present

## 2021-04-05 DIAGNOSIS — I471 Supraventricular tachycardia: Secondary | ICD-10-CM | POA: Diagnosis not present

## 2021-04-05 DIAGNOSIS — E782 Mixed hyperlipidemia: Secondary | ICD-10-CM | POA: Diagnosis not present

## 2021-04-05 DIAGNOSIS — I25118 Atherosclerotic heart disease of native coronary artery with other forms of angina pectoris: Secondary | ICD-10-CM

## 2021-04-05 NOTE — Patient Instructions (Signed)
Medication Instructions:  No changes  If you need a refill on your cardiac medications before your next appointment, please call your pharmacy.   Lab work: No new labs needed  Testing/Procedures: No new testing needed  Follow-Up: At [facility], you and your health needs are our priority.  As part of our continuing mission to provide you with exceptional heart care, we have created designated Provider Care Teams.  These Care Teams include your primary Cardiologist (physician) and Advanced Practice Providers (APPs -  Physician Assistants and Nurse Practitioners) who all work together to provide you with the care you need, when you need it.  You will need a follow up appointment in 12 months  Providers on your designated Care Team:   Christopher Berge, NP Ryan Dunn, PA-C Cadence Furth, PA-C  COVID-19 Vaccine Information can be found at: [email or web address] For questions related to vaccine distribution or appointments, please email [email or web address] or call [phone].   

## 2021-04-05 NOTE — Progress Notes (Signed)
Cardiology Office Note  Date:  04/05/2021   ID:  Safia, Panzer Sep 18, 1955, MRN [id number]  PCP:  Maryland Pink, MD   Chief Complaint  Patient presents with   12 month follow up     "Doing well." Medications reviewed by the patient verbally.     HPI:  66 year old woman with  Past medical history of SVT  hypertension  lumpectomy for breast cancer,  substernal chest pain, diaphoresis sweating , troponins up to 1.5 CTA chest was performed and was neg for PE.   Cardiac cath 2019 with suspected coronary dissection of the mid to distal LAD with severe stenosis/tapering mid LAD to apical region Severe apical and periapical akinesis. Mildly depressed EF, 45 to 50% on echo 05/2017 Normal echo 10/2017, normal ejection fraction Who presents for follow-up of her coronary artery disease , dissection  LOV 03/2020 [place] 3x a week Denies shortness of breath or chest pain on exertion  Retired Has 3 grandchildren, takes to school Some stress with everybody under 1 roof  No arrhythmia, rare aggravation might cause palpitations No SVT  Labs reviewed Total chol 119, LDL46 HAB1C 6.0  EKG personally reviewed by myself on todays visit Normal sinus rhythm rate 67 bpm no significant ST-T wave changes    Prior oncology hx  stage Ia left breast cancer status post lumpectomy and sentinel lymph node biopsy in May 2019.  Pathology showed 2 adjacent foci of invasive mammary carcinoma.  Largest focus was 11 mm and grade 1.  There was intermediate grade DCIS with focal necrosis spanning 2.4 cm.  Tumor was greater than 90% ER PR positive and HER-2/neu negative Oncotype DX recurrence score was 4 and she did not require adjuvant chemotherapy.  She completed postlumpectomy radiation and was started on Femara  Father passed 12/2018 Esophageal cancer  LHC 06/22/17:  Mid LAD to Dist LAD lesion is 80% stenosed. The left ventricular ejection fraction is 45-50% by visual estimate. LV end diastolic  pressure is normal. There is mild left ventricular systolic dysfunction. There is no mitral valve regurgitation.  Echo 06/22/17: - Left ventricle: The cavity size was normal. Systolic function was normal. The estimated ejection fraction was in the range of 50% to 55%. Select images with at least moderate hypokinesis in the apical, periapical and apical septal region Doppler parameters are consistent with abnormal left ventricular relaxation (grade 1 diastolic dysfunction). - Left atrium: The atrium was normal in size. - Right ventricle: Systolic function was normal. - Pulmonary arteries: Systolic pressure was within the normal range.  PMH:   has a past medical history of Atypical chest pain, Breast cancer ([place]), Breast cancer of upper-outer quadrant of left female breast ([place]) (05/30/2017), Colon polyps, Complication of anesthesia, Concussion (1572), Diastolic dysfunction, Family history of adverse reaction to anesthesia, GERD (gastroesophageal reflux disease), History of hiatal hernia, Hypertension, Migraine, Osteopenia, Osteopenia, Personal history of radiation therapy, PONV (postoperative nausea and vomiting), Shingles, Spontaneous dissection of coronary artery (06/21/2017), SVT (supraventricular tachycardia) ([place]), VAIN I (vaginal intraepithelial neoplasia grade I), and Varicose veins.  PSH:    Past Surgical History:  Procedure Laterality Date   BREAST BIOPSY Left 05/01/2017    x 2 areas.  2:00 6cmfn irregular mass wing clip.  2:00 6cmfn round mass venus clip. DUCTAL CARCINOMA IN SITU and INVASIVE MAMMARY CARCINOMA   BREAST BIOPSY Left 05/10/2017   affirm bx with  X marker  PSEUDO-ANGIOMATOUS STROMAL HYPERPLASIA /Upper outer Quad   BREAST BIOPSY Left 2020   [facility] office- 3 oclock-  fibrosis and fat necrosis   BREAST LUMPECTOMY Left 05/30/2017   lumpectomy of wing and venus markers, invasive mammary carcinoma and DCIS   BREAST LUMPECTOMY WITH SENTINEL LYMPH NODE BIOPSY Left 05/30/2017    Procedure: BREAST LUMPECTOMY WITH SENTINEL LYMPH NODE BX,NEEDLE LOCALIZATION;  Surgeon: Robert Bellow, MD;  Location: [facility];  Service: General;  Laterality: Left;   cervical cryosurgery  1980   COLONOSCOPY  2011   COLONOSCOPY WITH PROPOFOL N/A 11/12/2019   Procedure: COLONOSCOPY WITH PROPOFOL;  Surgeon: Robert Bellow, MD;  Location: [facility];  Service: Endoscopy;  Laterality: N/A;   DILATION AND CURETTAGE OF UTERUS     x 2   LEFT HEART CATH AND CORONARY ANGIOGRAPHY N/A 06/22/2017   Procedure: LEFT HEART CATH AND CORONARY ANGIOGRAPHY;  Surgeon: Minna Merritts, MD;  Location: [facility];  Service: Cardiovascular;  Laterality: N/A;   MASTECTOMY     partial lumpectomy left   TUBAL LIGATION     VAGINAL HYSTERECTOMY     tvh    Current Outpatient Medications  Medication Sig Dispense Refill   acetaminophen (TYLENOL) 500 MG tablet Take 1,000 mg by mouth every 6 (six) hours as needed (for pain/headaches.).     aspirin 81 MG EC tablet Take 1 tablet (81 mg total) by mouth daily. 30 tablet 0   atorvastatin (LIPITOR) 80 MG tablet Take 1 tablet (80 mg total) by mouth daily at 6 PM. 90 tablet 4   calcium-vitamin D (OSCAL WITH D) 500-200 MG-UNIT tablet Take 2 tablets by mouth daily with breakfast.      letrozole (FEMARA) 2.5 MG tablet Take 1 tablet (2.5 mg total) by mouth daily. 90 tablet 1   metoprolol tartrate (LOPRESSOR) 50 MG tablet TAKE 1 TABLET BY MOUTH TWICE A DAY 180 tablet 3   Multiple Vitamin (MULTIVITAMIN WITH MINERALS) TABS tablet Take 1 tablet by mouth daily.     nitroGLYCERIN (NITROSTAT) 0.4 MG SL tablet Place 1 tablet (0.4 mg total) under the tongue every 5 (five) minutes as needed for chest pain. 25 tablet 3   pantoprazole (PROTONIX) 40 MG tablet Take 40 mg by mouth daily.  5   vitamin C (ASCORBIC ACID) 500 MG tablet Take 500 mg by mouth daily.     No current facility-administered medications for this visit.    Allergies:   Patient has no active  allergies.   Social History:  The patient  reports that she has never smoked. She has never used smokeless tobacco. She reports that she does not drink alcohol and does not use drugs.   Family History:   family history includes Anxiety disorder in her brother; Breast cancer (age of onset: 41) in her paternal aunt; Breast cancer (age of onset: 33) in her mother; Cancer in her paternal aunt; Diabetes in her father; Esophageal cancer (age of onset: 5) in her father; Hypertension in her brother, brother, and father; Stomach cancer in her paternal grandfather; Thyroid disease in her father.    Review of Systems: Review of Systems  Constitutional: Negative.   Respiratory: Negative.    Cardiovascular:  Positive for palpitations.  Gastrointestinal: Negative.   Musculoskeletal: Negative.   Neurological: Negative.   Psychiatric/Behavioral: Negative.    All other systems reviewed and are negative.  PHYSICAL EXAM: VS:  BP 138/79 (BP Location: Left Arm, Patient Position: Sitting, Cuff Size: Normal)    Pulse 67    Ht 5' 2"  (1.575 m)    Wt 151 lb 2 oz (68.5 kg)  SpO2 98%    BMI 27.64 kg/m  , BMI Body mass index is 27.64 kg/m. Constitutional:  oriented to person, place, and time. No distress.  HENT:  Head: Grossly normal Eyes:  no discharge. No scleral icterus.  Neck: No JVD, no carotid bruits  Cardiovascular: Regular rate and rhythm, no murmurs appreciated Pulmonary/Chest: Clear to auscultation bilaterally, no wheezes or rails Abdominal: Soft.  no distension.  no tenderness.  Musculoskeletal: Normal range of motion Neurological:  normal muscle tone. Coordination normal. No atrophy Skin: Skin warm and dry Psychiatric: normal affect, pleasant  Recent Labs: 09/17/2020: ALT 31; BUN 18; Creatinine, Ser 0.86; Hemoglobin 13.3; Platelets 245; Potassium 4.0; Sodium 140    Lipid Panel Lab Results  Component Value Date   CHOL 114 10/09/2017   HDL 39 (L) 10/09/2017   LDLCALC 52 10/09/2017   TRIG  117 10/09/2017    Wt Readings from Last 3 Encounters:  04/05/21 151 lb 2 oz (68.5 kg)  03/18/21 151 lb 6.4 oz (68.7 kg)  12/06/20 155 lb 11.2 oz (70.6 kg)     ASSESSMENT AND PLAN:  NSTEMI (non-ST elevated myocardial infarction) ([place])  spontaneous coronary dissection 2019 Normal echo 2019, made full recovery No angina Exercising on a regular basis without symptoms, weight trending down  Essential (primary) hypertension Blood pressure is well controlled on today's visit. No changes made to the medications.  Supraventricular tachycardia (H[facility]) No recent episodes of SVT metoprolol tartrate 50 twice daily  No epsiodes  Breast cancer Completed radiation treatment, on the left On maintenance medication, letrazole 2019 stable  Coronary artery dissection Made full recovery, no further intervention needed at this time    Total encounter time more than 30 minutes  Greater than 50% was spent in counseling and coordination of care with the patient    Orders Placed This Encounter  Procedures   EKG 12-Lead     Signed, Esmond Plants, M.D., Ph.D. 04/05/2021  [place], [place]

## 2021-04-21 ENCOUNTER — Ambulatory Visit
Admission: RE | Admit: 2021-04-21 | Discharge: 2021-04-21 | Disposition: A | Discharge status: Home / Self Care | Payer: Medicare Other | Source: Ambulatory Visit | Attending: Family Medicine | Admitting: Family Medicine

## 2021-04-21 ENCOUNTER — Other Ambulatory Visit: Payer: Self-pay

## 2021-04-21 DIAGNOSIS — Z1231 Encounter for screening mammogram for malignant neoplasm of breast: Secondary | ICD-10-CM | POA: Diagnosis present

## 2021-04-26 ENCOUNTER — Other Ambulatory Visit: Payer: Self-pay | Admitting: Family Medicine

## 2021-04-26 DIAGNOSIS — N6489 Other specified disorders of breast: Secondary | ICD-10-CM

## 2021-04-26 DIAGNOSIS — R928 Other abnormal and inconclusive findings on diagnostic imaging of breast: Secondary | ICD-10-CM

## 2021-05-03 ENCOUNTER — Ambulatory Visit
Admission: RE | Admit: 2021-05-03 | Discharge: 2021-05-03 | Disposition: A | Discharge status: Home / Self Care | Payer: Medicare Other | Source: Ambulatory Visit | Attending: Nurse Practitioner | Admitting: Nurse Practitioner

## 2021-05-03 DIAGNOSIS — Z853 Personal history of malignant neoplasm of breast: Secondary | ICD-10-CM | POA: Insufficient documentation

## 2021-05-03 DIAGNOSIS — Z780 Asymptomatic menopausal state: Secondary | ICD-10-CM | POA: Diagnosis not present

## 2021-05-03 DIAGNOSIS — Z923 Personal history of irradiation: Secondary | ICD-10-CM | POA: Insufficient documentation

## 2021-05-03 DIAGNOSIS — Z13820 Encounter for screening for osteoporosis: Secondary | ICD-10-CM | POA: Diagnosis not present

## 2021-05-03 DIAGNOSIS — Z08 Encounter for follow-up examination after completed treatment for malignant neoplasm: Secondary | ICD-10-CM | POA: Diagnosis present

## 2021-05-03 DIAGNOSIS — M85852 Other specified disorders of bone density and structure, left thigh: Secondary | ICD-10-CM | POA: Diagnosis not present

## 2021-05-05 ENCOUNTER — Telehealth: Payer: Self-pay | Admitting: *Deleted

## 2021-05-05 NOTE — Telephone Encounter (Signed)
-----   Message from Verlon Au, NP sent at 05/03/2021  4:21 PM EDT ----- ?Please let patient know that her bone density is stable compared to 2021.  ? ?----- Message ----- ?From: Interface, Rad Results In ?Sent: 05/03/2021   1:35 PM EDT ?To: Verlon Au, NP ? ? ?

## 2021-05-05 NOTE — Telephone Encounter (Signed)
I called the pt . And let her know that I got a message from provider and her 2021 compared to the 2023 is stable. She was happy to get good news. ? ?

## 2021-05-11 ENCOUNTER — Ambulatory Visit
Admission: RE | Admit: 2021-05-11 | Discharge: 2021-05-11 | Disposition: A | Discharge status: Home / Self Care | Payer: Medicare Other | Source: Ambulatory Visit | Attending: Family Medicine | Admitting: Family Medicine

## 2021-05-11 DIAGNOSIS — N6489 Other specified disorders of breast: Secondary | ICD-10-CM

## 2021-05-11 DIAGNOSIS — R928 Other abnormal and inconclusive findings on diagnostic imaging of breast: Secondary | ICD-10-CM

## 2021-05-17 ENCOUNTER — Other Ambulatory Visit: Payer: Self-pay | Admitting: General Surgery

## 2021-05-17 DIAGNOSIS — Z170 Estrogen receptor positive status [ER+]: Secondary | ICD-10-CM

## 2021-05-31 ENCOUNTER — Ambulatory Visit
Admission: RE | Admit: 2021-05-31 | Discharge: 2021-05-31 | Disposition: A | Discharge status: Home / Self Care | Payer: Medicare Other | Source: Ambulatory Visit | Attending: General Surgery | Admitting: General Surgery

## 2021-05-31 DIAGNOSIS — Z170 Estrogen receptor positive status [ER+]: Secondary | ICD-10-CM | POA: Insufficient documentation

## 2021-05-31 DIAGNOSIS — C50412 Malignant neoplasm of upper-outer quadrant of left female breast: Secondary | ICD-10-CM | POA: Insufficient documentation

## 2021-05-31 MED ORDER — GADOBUTROL 1 MMOL/ML IV SOLN
7.0000 mL | Freq: Once | INTRAVENOUS | Status: AC | PRN
  Administered 2021-05-31: 7 mL via INTRAVENOUS

## 2021-06-03 ENCOUNTER — Other Ambulatory Visit: Payer: Self-pay | Admitting: Oncology

## 2021-06-13 ENCOUNTER — Other Ambulatory Visit: Payer: Self-pay | Admitting: Cardiovascular Disease

## 2021-09-23 ENCOUNTER — Other Ambulatory Visit: Payer: Self-pay

## 2021-09-23 DIAGNOSIS — Z08 Encounter for follow-up examination after completed treatment for malignant neoplasm: Secondary | ICD-10-CM

## 2021-09-26 ENCOUNTER — Encounter: Payer: Self-pay | Admitting: Oncology

## 2021-09-26 ENCOUNTER — Inpatient Hospital Stay: Payer: Medicare Other | Attending: Oncology

## 2021-09-26 ENCOUNTER — Inpatient Hospital Stay (HOSPITAL_BASED_OUTPATIENT_CLINIC_OR_DEPARTMENT_OTHER): Payer: Medicare Other | Admitting: Oncology

## 2021-09-26 VITALS — BP 131/73 | HR 61 | Temp 98.3°F | Resp 18 | Wt 150.5 lb

## 2021-09-26 DIAGNOSIS — Z853 Personal history of malignant neoplasm of breast: Secondary | ICD-10-CM

## 2021-09-26 DIAGNOSIS — Z79811 Long term (current) use of aromatase inhibitors: Secondary | ICD-10-CM | POA: Insufficient documentation

## 2021-09-26 DIAGNOSIS — M8580 Other specified disorders of bone density and structure, unspecified site: Secondary | ICD-10-CM | POA: Diagnosis not present

## 2021-09-26 DIAGNOSIS — Z7982 Long term (current) use of aspirin: Secondary | ICD-10-CM | POA: Diagnosis not present

## 2021-09-26 DIAGNOSIS — Z803 Family history of malignant neoplasm of breast: Secondary | ICD-10-CM | POA: Insufficient documentation

## 2021-09-26 DIAGNOSIS — Z79899 Other long term (current) drug therapy: Secondary | ICD-10-CM | POA: Insufficient documentation

## 2021-09-26 DIAGNOSIS — C50412 Malignant neoplasm of upper-outer quadrant of left female breast: Secondary | ICD-10-CM | POA: Diagnosis present

## 2021-09-26 DIAGNOSIS — Z08 Encounter for follow-up examination after completed treatment for malignant neoplasm: Secondary | ICD-10-CM | POA: Diagnosis not present

## 2021-09-26 DIAGNOSIS — Z170 Estrogen receptor positive status [ER+]: Secondary | ICD-10-CM | POA: Insufficient documentation

## 2021-09-26 DIAGNOSIS — Z923 Personal history of irradiation: Secondary | ICD-10-CM | POA: Insufficient documentation

## 2021-09-26 LAB — CBC WITH DIFFERENTIAL/PLATELET
Abs Immature Granulocytes: 0 10*3/uL (ref 0.00–0.07)
Basophils Absolute: 0.1 10*3/uL (ref 0.0–0.1)
Basophils Relative: 1 %
Eosinophils Absolute: 0.1 10*3/uL (ref 0.0–0.5)
Eosinophils Relative: 3 %
HCT: 40.7 % (ref 36.0–46.0)
Hemoglobin: 13.5 g/dL (ref 12.0–15.0)
Immature Granulocytes: 0 %
Lymphocytes Relative: 23 %
Lymphs Abs: 1.1 10*3/uL (ref 0.7–4.0)
MCH: 31.5 pg (ref 26.0–34.0)
MCHC: 33.2 g/dL (ref 30.0–36.0)
MCV: 95.1 fL (ref 80.0–100.0)
Monocytes Absolute: 0.4 10*3/uL (ref 0.1–1.0)
Monocytes Relative: 7 %
Neutro Abs: 3.1 10*3/uL (ref 1.7–7.7)
Neutrophils Relative %: 66 %
Platelets: 207 10*3/uL (ref 150–400)
RBC: 4.28 MIL/uL (ref 3.87–5.11)
RDW: 13 % (ref 11.5–15.5)
WBC: 4.7 10*3/uL (ref 4.0–10.5)
nRBC: 0 % (ref 0.0–0.2)

## 2021-09-26 LAB — COMPREHENSIVE METABOLIC PANEL
ALT: 19 U/L (ref 0–44)
AST: 22 U/L (ref 15–41)
Albumin: 4.3 g/dL (ref 3.5–5.0)
Alkaline Phosphatase: 68 U/L (ref 38–126)
Anion gap: 7 (ref 5–15)
BUN: 15 mg/dL (ref 8–23)
CO2: 25 mmol/L (ref 22–32)
Calcium: 8.9 mg/dL (ref 8.9–10.3)
Chloride: 107 mmol/L (ref 98–111)
Creatinine, Ser: 0.81 mg/dL (ref 0.44–1.00)
GFR, Estimated: >60 mL/min (ref >60)
Glucose, Bld: 154 mg/dL — ABNORMAL HIGH (ref 70–99)
Potassium: 4.2 mmol/L (ref 3.5–5.1)
Sodium: 139 mmol/L (ref 135–145)
Total Bilirubin: 0.8 mg/dL (ref 0.3–1.2)
Total Protein: 7.4 g/dL (ref 6.5–8.1)
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
## 2021-10-10 ENCOUNTER — Other Ambulatory Visit: Payer: Self-pay | Admitting: General Surgery

## 2021-10-10 DIAGNOSIS — Z170 Estrogen receptor positive status [ER+]: Secondary | ICD-10-CM

## 2021-10-11 ENCOUNTER — Ambulatory Visit (INDEPENDENT_AMBULATORY_CARE_PROVIDER_SITE_OTHER): Payer: Medicare Other | Admitting: Obstetrics and Gynecology

## 2021-10-11 ENCOUNTER — Encounter: Payer: Self-pay | Admitting: Obstetrics and Gynecology

## 2021-10-11 VITALS — BP 128/83 | HR 78 | Ht 62.0 in | Wt 148.8 lb

## 2021-10-11 DIAGNOSIS — Z01419 Encounter for gynecological examination (general) (routine) without abnormal findings: Secondary | ICD-10-CM

## 2021-10-11 DIAGNOSIS — N816 Rectocele: Secondary | ICD-10-CM | POA: Diagnosis not present

## 2021-10-11 DIAGNOSIS — Z853 Personal history of malignant neoplasm of breast: Secondary | ICD-10-CM

## 2021-10-11 DIAGNOSIS — N8111 Cystocele, midline: Secondary | ICD-10-CM | POA: Diagnosis not present

## 2021-10-11 NOTE — Progress Notes (Signed)
Patients presents for annual exam today. She states she has been doing well over the last year. History of hysterectomy, pap smear no longer needed. Patient is up to date for mammogram. Annual labs are declined, done by PCP. Patient states no other questions or concerns at this time.

## 2021-10-11 NOTE — Progress Notes (Signed)
HPI:      [name] is a 66 y.o. G2P2002 who LMP was No LMP recorded. Patient has had a hysterectomy.  Subjective:   She presents today for her annual examination.  She continues to be very careful about her diet and is watching her weight.  She continues to lose weight purposefully!!! She has a remote history of breast cancer remains on letrozole.  She thinks that she has 1 more year left. Her son's family continues to live with her and she is looking forward to when they move out in the next 6 months because it sounds as if everyone has a very hectic schedule.  It seems a mixed blessing to have them. She continues to take calcium daily.     Hx: The following portions of the patient's history were reviewed and updated as appropriate:             She  has a past medical history of Atypical chest pain, Breast cancer ([place]), Breast cancer of upper-outer quadrant of left female breast ([place]) (05/30/2017), Colon polyps, Complication of anesthesia, Concussion (8250), Diastolic dysfunction, Family history of adverse reaction to anesthesia, GERD (gastroesophageal reflux disease), History of hiatal hernia, Hypertension, Migraine, Osteopenia, Osteopenia, Personal history of radiation therapy, PONV (postoperative nausea and vomiting), Shingles, Spontaneous dissection of coronary artery (06/21/2017), SVT (supraventricular tachycardia) ([place]), VAIN I (vaginal intraepithelial neoplasia grade I), and Varicose veins. She does not have any pertinent problems on file. She  has a past surgical history that includes Tubal ligation; Dilation and curettage of uterus; cervical cryosurgery (1980); Colonoscopy (2011); Vaginal hysterectomy; Breast lumpectomy (Left, 05/30/2017); Breast lumpectomy with sentinel lymph node bx (Left, 05/30/2017); LEFT HEART CATH AND CORONARY ANGIOGRAPHY (N/A, 06/22/2017); Breast biopsy (Left, 05/01/2017); Breast biopsy (Left, 05/10/2017); Breast biopsy (Left, 2020); Mastectomy; and Colonoscopy  with propofol (N/A, 11/12/2019). Her family history includes Anxiety disorder in her brother; Breast cancer (age of onset: 61) in her paternal aunt; Breast cancer (age of onset: 87) in her mother; Cancer in her paternal aunt; Diabetes in her father; Esophageal cancer (age of onset: 62) in her father; Hypertension in her brother, brother, and father; Stomach cancer in her paternal grandfather; Thyroid disease in her father. She  reports that she has never smoked. She has never used smokeless tobacco. She reports that she does not drink alcohol and does not use drugs. She has a current medication list which includes the following prescription(s): acetaminophen, aspirin ec, atorvastatin, atorvastatin, calcium-vitamin d, letrozole, metoprolol tartrate, multivitamin with minerals, pantoprazole, ascorbic acid, and nitroglycerin. She has no active allergies.       Review of Systems:  Review of Systems  Constitutional: Denied constitutional symptoms, night sweats, recent illness, fatigue, fever, insomnia and weight loss.  Eyes: Denied eye symptoms, eye pain, photophobia, vision change and visual disturbance.  Ears/Nose/Throat/Neck: Denied ear, nose, throat or neck symptoms, hearing loss, nasal discharge, sinus congestion and sore throat.  Cardiovascular: Denied cardiovascular symptoms, arrhythmia, chest pain/pressure, edema, exercise intolerance, orthopnea and palpitations.  Respiratory: Denied pulmonary symptoms, asthma, pleuritic pain, productive sputum, cough, dyspnea and wheezing.  Gastrointestinal: Denied, gastro-esophageal reflux, melena, nausea and vomiting.  Genitourinary: Denied genitourinary symptoms including symptomatic vaginal discharge, pelvic relaxation issues, and urinary complaints.  Musculoskeletal: Denied musculoskeletal symptoms, stiffness, swelling, muscle weakness and myalgia.  Dermatologic: Denied dermatology symptoms, rash and scar.  Neurologic: Denied neurology symptoms, dizziness,  headache, neck pain and syncope.  Psychiatric: Denied psychiatric symptoms, anxiety and depression.  Endocrine: Denied endocrine symptoms including hot flashes and  night sweats.   Meds:   Current Outpatient Medications on File Prior to Visit  Medication Sig Dispense Refill   acetaminophen (TYLENOL) 500 MG tablet Take 1,000 mg by mouth every 6 (six) hours as needed (for pain/headaches.).     aspirin 81 MG EC tablet Take 1 tablet (81 mg total) by mouth daily. 30 tablet 0   atorvastatin (LIPITOR) 80 MG tablet Take 1 tablet (80 mg total) by mouth daily at 6 PM. 90 tablet 4   atorvastatin (LIPITOR) 80 MG tablet Take by mouth.     calcium-vitamin D (OSCAL WITH D) 500-200 MG-UNIT tablet Take 2 tablets by mouth daily with breakfast.      letrozole (FEMARA) 2.5 MG tablet TAKE 1 TABLET(2.5 MG) BY MOUTH DAILY 90 tablet 1   metoprolol tartrate (LOPRESSOR) 50 MG tablet TAKE 1 TABLET BY MOUTH TWICE DAILY 180 tablet 2   Multiple Vitamin (MULTIVITAMIN WITH MINERALS) TABS tablet Take 1 tablet by mouth daily.     pantoprazole (PROTONIX) 40 MG tablet Take 40 mg by mouth daily.  5   vitamin C (ASCORBIC ACID) 500 MG tablet Take 500 mg by mouth daily.     nitroGLYCERIN (NITROSTAT) 0.4 MG SL tablet Place 1 tablet (0.4 mg total) under the tongue every 5 (five) minutes as needed for chest pain. 25 tablet 3   No current facility-administered medications on file prior to visit.     Objective:     Vitals:   10/11/21 0803  BP: 128/83  Pulse: 78    Filed Weights   10/11/21 0803  Weight: 148 lb 12.8 oz (67.5 kg)              Physical examination General NAD, Conversant  HEENT Atraumatic; Op clear with mmm.  Normo-cephalic. Pupils reactive. Anicteric sclerae  Thyroid/Neck Smooth without nodularity or enlargement. Normal ROM.  Neck Supple.  Skin No rashes, lesions or ulceration. Normal palpated skin turgor. No nodularity.  Breasts: No masses or discharge.  Symmetric.  No axillary adenopathy.  Lungs: Clear  to auscultation.No rales or wheezes. Normal Respiratory effort, no retractions.  Heart: NSR.  No murmurs or rubs appreciated. No periferal edema  Abdomen: Soft.  Non-tender.  No masses.  No HSM. No hernia  Extremities: Moves all appropriately.  Normal ROM for age. No lymphadenopathy.  Neuro: Oriented to PPT.  Normal mood. Normal affect.     Pelvic:   Vulva: Normal appearance.  No lesions.   Vagina: No lesions or abnormalities noted.  Moderate vaginal atrophy noted  Support: Second-degree cystocele, second-degree rectocele  Urethra No masses tenderness or scarring.  Meatus Normal size without lesions or prolapse.  Cervix: Surgically absent   Anus: Normal exam.  No lesions.  Perineum: Normal exam.  No lesions.        Bimanual   Uterus: Surgically absent   Adnexae: No masses.  Non-tender to palpation.  Cul-de-sac: Negative for abnormality.     Assessment:    [id number] Patient Active Problem List   Diagnosis Date Noted   Mixed hyperlipidemia 03/21/2018   Malignant neoplasm of upper-outer quadrant of left breast in female, estrogen receptor positive ([place]) 09/18/2017   Vaginal atrophy 09/18/2017   Stress incontinence, female 09/18/2017   Chest pain    Coronary artery dissection    Non-ST elevation myocardial infarction (NSTEMI), subsequent episode of care [facility]) 06/21/2017   Goals of care, counseling/discussion 05/18/2017   Osteopenia 05/14/2017   Primary cancer of upper outer quadrant of left breast ([place]) 05/13/2017  Menopausal state 08/26/2015   Status post vaginal hysterectomy 08/26/2015   VIN (vulval intraepithelial neoplasia) I 08/26/2015   Acid reflux 08/25/2014   H/O varicella 08/25/2014   LBP (low back pain) 08/25/2014   Headache, migraine 08/25/2014   Herpes zona 08/25/2014   Supraventricular tachycardia ([place]) 08/25/2014   Essential hypertension 08/25/2014   Avitaminosis D 08/25/2014   Enthesopathy of hip 08/25/2014     1. Well woman exam with routine gynecological  exam   2. Cystocele, midline   3. History of breast cancer   4. Rectocele        Plan:            1.  Basic Screening Recommendations The basic screening recommendations for asymptomatic women were discussed with the patient during her visit.  The age-appropriate recommendations were discussed with her and the rational for the tests reviewed.  When I am informed by the patient that another primary care physician has previously obtained the age-appropriate tests and they are up-to-date, only outstanding tests are ordered and referrals given as necessary.  Abnormal results of tests will be discussed with her when all of her results are completed.  Routine preventative health maintenance measures emphasized: Exercise/Diet/Weight control, Tobacco Warnings, Alcohol/Substance use risks and Stress Management  Orders No orders of the defined types were placed in this encounter.   No orders of the defined types were placed in this encounter.         F/U  Return in about 1 year (around 10/12/2022) for Annual Physical.  Finis Bud, M.D. 10/11/2021 8:25 AM

## 2021-11-28 ENCOUNTER — Other Ambulatory Visit: Payer: Self-pay | Admitting: Oncology

## 2021-12-05 ENCOUNTER — Encounter: Payer: Self-pay | Admitting: Radiation Oncology

## 2021-12-05 ENCOUNTER — Ambulatory Visit
Admission: RE | Admit: 2021-12-05 | Discharge: 2021-12-05 | Disposition: A | Discharge status: Home / Self Care | Payer: Medicare Other | Source: Ambulatory Visit | Attending: Radiation Oncology | Admitting: Radiation Oncology

## 2021-12-05 VITALS — BP 134/51 | HR 66 | Temp 98.1°F | Ht 62.0 in | Wt 149.6 lb

## 2021-12-05 DIAGNOSIS — C50412 Malignant neoplasm of upper-outer quadrant of left female breast: Secondary | ICD-10-CM | POA: Insufficient documentation

## 2021-12-05 DIAGNOSIS — Z923 Personal history of irradiation: Secondary | ICD-10-CM | POA: Diagnosis not present

## 2021-12-05 DIAGNOSIS — Z170 Estrogen receptor positive status [ER+]: Secondary | ICD-10-CM | POA: Insufficient documentation

## 2021-12-05 NOTE — Progress Notes (Signed)
Radiation Oncology Follow up Note  Name: Maria Barnes   Date:   12/05/2021 MRN:  [id number] DOB: [birth year]    This 66 y.o. female presents to the clinic today for 4-year follow-up status post whole breast radiation to her left breast for stage I ER/PR positive invasive mammary carcinoma.  REFERRING PROVIDER: Maryland Pink, MD  HPI: Patient is a 66 year old female now out over 4 years having completed whole breast radiation to her left breast for stage I ER/PR positive invasive mammary carcinoma.  Seen today in routine follow-up she is doing well specifically denies breast tenderness cough or bone pain.Marland Kitchen  Recent mammogram back in April showed some End's asymmetry and trabeculation throughout the entire aspect of the left breast.  MRI of the breast showed diffuse nonenhancing left breast skin thickening with several areas of fat necrosis extending from lumpectomy site towards the skin favored to represent postsurgical changes.  No MR evidence of malignancy in the right breast is noted she has been scheduled for repeat mammogram tomorrow.  COMPLICATIONS OF TREATMENT: none  FOLLOW UP COMPLIANCE: keeps appointments   PHYSICAL EXAM:  BP (!) 134/51   Pulse 66   Temp 98.1 F (36.7 C)   Ht '5\' 2"'$  (1.575 m)   Wt 149 lb 9.6 oz (67.9 kg)   BMI 27.36 kg/m  Lungs are clear to A&P cardiac examination essentially unremarkable with regular rate and rhythm. No dominant mass or nodularity is noted in either breast in 2 positions examined. Incision is well-healed. No axillary or supraclavicular adenopathy is appreciated. Cosmetic result is excellent.  Well-developed well-nourished patient in NAD. HEENT reveals PERLA, EOMI, discs not visualized.  Oral cavity is clear. No oral mucosal lesions are identified. Neck is clear without evidence of cervical or supraclavicular adenopathy. Lungs are clear to A&P. Cardiac examination is essentially unremarkable with regular rate and rhythm without murmur rub or thrill.  Abdomen is benign with no organomegaly or masses noted. Motor sensory and DTR levels are equal and symmetric in the upper and lower extremities. Cranial nerves II through XII are grossly intact. Proprioception is intact. No peripheral adenopathy or edema is identified. No motor or sensory levels are noted. Crude visual fields are within normal range.  RADIOLOGY RESULTS: MRI scan mammograms ultrasound all reviewed compatible with above-stated findings  PLAN: At this time patient is doing well now out over 4 years with no evidence of disease.  I have reviewed all her imaging.  I am going to discontinue follow-up care on her.  She continues close follow-up care with Dr. Janese Banks.  Patient is to call with any concerns at any time.  I would like to take this opportunity to thank you for allowing me to participate in the care of your patient.Noreene Filbert, MD

## 2021-12-06 ENCOUNTER — Ambulatory Visit
Admission: RE | Admit: 2021-12-06 | Discharge: 2021-12-06 | Disposition: A | Discharge status: Home / Self Care | Payer: Medicare Other | Source: Ambulatory Visit | Attending: Oncology | Admitting: Oncology

## 2021-12-06 DIAGNOSIS — Z08 Encounter for follow-up examination after completed treatment for malignant neoplasm: Secondary | ICD-10-CM | POA: Insufficient documentation

## 2021-12-06 DIAGNOSIS — Z853 Personal history of malignant neoplasm of breast: Secondary | ICD-10-CM | POA: Insufficient documentation

## 2021-12-07 ENCOUNTER — Other Ambulatory Visit: Payer: Self-pay | Admitting: Oncology

## 2021-12-07 DIAGNOSIS — R928 Other abnormal and inconclusive findings on diagnostic imaging of breast: Secondary | ICD-10-CM

## 2021-12-07 DIAGNOSIS — N63 Unspecified lump in unspecified breast: Secondary | ICD-10-CM

## 2021-12-15 ENCOUNTER — Encounter: Payer: Self-pay | Admitting: *Deleted

## 2021-12-15 NOTE — Progress Notes (Signed)
Dr. Janese Banks asked me to call Maria Barnes and let her know that if she changes her mind and decides to have the breast biopsies to just call and let us know and we will get them scheduled, otherwise she can just keep her follow up with her in February.

## 2022-02-22 ENCOUNTER — Other Ambulatory Visit: Payer: Self-pay | Admitting: Cardiovascular Disease

## 2022-03-13 ENCOUNTER — Other Ambulatory Visit: Payer: Self-pay | Admitting: General Surgery

## 2022-03-13 DIAGNOSIS — N6489 Other specified disorders of breast: Secondary | ICD-10-CM

## 2022-03-29 ENCOUNTER — Inpatient Hospital Stay: Payer: Medicare Other | Attending: Oncology | Admitting: Oncology

## 2022-03-29 ENCOUNTER — Encounter: Payer: Self-pay | Admitting: Oncology

## 2022-03-29 VITALS — BP 123/64 | HR 80 | Temp 96.5°F | Resp 18 | Ht 62.0 in | Wt 152.8 lb

## 2022-03-29 DIAGNOSIS — Z79899 Other long term (current) drug therapy: Secondary | ICD-10-CM | POA: Insufficient documentation

## 2022-03-29 DIAGNOSIS — M8580 Other specified disorders of bone density and structure, unspecified site: Secondary | ICD-10-CM | POA: Insufficient documentation

## 2022-03-29 DIAGNOSIS — C50412 Malignant neoplasm of upper-outer quadrant of left female breast: Secondary | ICD-10-CM | POA: Insufficient documentation

## 2022-03-29 DIAGNOSIS — Z79811 Long term (current) use of aromatase inhibitors: Secondary | ICD-10-CM

## 2022-03-29 DIAGNOSIS — I10 Essential (primary) hypertension: Secondary | ICD-10-CM | POA: Insufficient documentation

## 2022-03-29 DIAGNOSIS — Z853 Personal history of malignant neoplasm of breast: Secondary | ICD-10-CM | POA: Diagnosis not present

## 2022-03-29 DIAGNOSIS — Z170 Estrogen receptor positive status [ER+]: Secondary | ICD-10-CM | POA: Diagnosis not present

## 2022-03-29 DIAGNOSIS — Z7982 Long term (current) use of aspirin: Secondary | ICD-10-CM | POA: Insufficient documentation

## 2022-03-29 DIAGNOSIS — M85852 Other specified disorders of bone density and structure, left thigh: Secondary | ICD-10-CM

## 2022-03-29 DIAGNOSIS — Z08 Encounter for follow-up examination after completed treatment for malignant neoplasm: Secondary | ICD-10-CM
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
## 2022-03-29 NOTE — Progress Notes (Signed)
No concerns for the provider today.

## 2022-04-25 ENCOUNTER — Other Ambulatory Visit: Payer: Self-pay | Admitting: General Surgery

## 2022-04-25 ENCOUNTER — Ambulatory Visit
Admission: RE | Admit: 2022-04-25 | Discharge: 2022-04-25 | Disposition: A | Discharge status: Home / Self Care | Payer: Medicare Other | Source: Ambulatory Visit | Attending: General Surgery | Admitting: General Surgery

## 2022-04-25 DIAGNOSIS — N6489 Other specified disorders of breast: Secondary | ICD-10-CM | POA: Insufficient documentation

## 2022-04-25 DIAGNOSIS — R928 Other abnormal and inconclusive findings on diagnostic imaging of breast: Secondary | ICD-10-CM

## 2022-04-25 DIAGNOSIS — N63 Unspecified lump in unspecified breast: Secondary | ICD-10-CM

## 2022-05-01 ENCOUNTER — Ambulatory Visit
Admission: RE | Admit: 2022-05-01 | Discharge: 2022-05-01 | Disposition: A | Discharge status: Home / Self Care | Payer: Medicare Other | Source: Ambulatory Visit | Attending: General Surgery | Admitting: General Surgery

## 2022-05-01 DIAGNOSIS — N63 Unspecified lump in unspecified breast: Secondary | ICD-10-CM

## 2022-05-01 DIAGNOSIS — R928 Other abnormal and inconclusive findings on diagnostic imaging of breast: Secondary | ICD-10-CM

## 2022-05-01 HISTORY — PX: BREAST BIOPSY: SHX20

## 2022-05-01 MED ORDER — LIDOCAINE-EPINEPHRINE 1 %-1:100000 IJ SOLN
8.0000 mL | Freq: Once | INTRAMUSCULAR | Status: AC
  Administered 2022-05-01: 8 mL
  Filled 2022-05-01: qty 8

## 2022-05-01 MED ORDER — LIDOCAINE HCL (PF) 1 % IJ SOLN
2.0000 mL | Freq: Once | INTRAMUSCULAR | Status: AC
  Administered 2022-05-01: 2 mL via INTRADERMAL
  Filled 2022-05-01: qty 2

## 2022-05-02 LAB — SURGICAL PATHOLOGY
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
## 2022-05-23 ENCOUNTER — Ambulatory Visit: Payer: Medicare Other | Attending: Cardiovascular Disease | Admitting: Cardiovascular Disease

## 2022-05-23 ENCOUNTER — Encounter: Payer: Self-pay | Admitting: Cardiovascular Disease

## 2022-05-23 ENCOUNTER — Other Ambulatory Visit: Payer: Self-pay | Admitting: Oncology

## 2022-05-23 ENCOUNTER — Other Ambulatory Visit: Payer: Self-pay | Admitting: Cardiovascular Disease

## 2022-05-23 VITALS — BP 137/88 | HR 67 | Ht 62.0 in | Wt 154.4 lb

## 2022-05-23 DIAGNOSIS — I471 Supraventricular tachycardia, unspecified: Secondary | ICD-10-CM | POA: Diagnosis not present

## 2022-05-23 DIAGNOSIS — I10 Essential (primary) hypertension: Secondary | ICD-10-CM | POA: Diagnosis not present

## 2022-05-23 DIAGNOSIS — E782 Mixed hyperlipidemia: Secondary | ICD-10-CM | POA: Diagnosis not present

## 2022-05-23 DIAGNOSIS — I25118 Atherosclerotic heart disease of native coronary artery with other forms of angina pectoris: Secondary | ICD-10-CM

## 2022-05-23 NOTE — Patient Instructions (Signed)
Medication Instructions:  No changes  If you need a refill on your cardiac medications before your next appointment, please call your pharmacy.   Lab work: No new labs needed  Testing/Procedures: No new testing needed  Follow-Up: At [facility], you and your health needs are our priority.  As part of our continuing mission to provide you with exceptional heart care, we have created designated Provider Care Teams.  These Care Teams include your primary Cardiologist (physician) and Advanced Practice Providers (APPs -  Physician Assistants and Nurse Practitioners) who all work together to provide you with the care you need, when you need it.  You will need a follow up appointment in 12 months  Providers on your designated Care Team:   Christopher Berge, NP Ryan Dunn, PA-C Cadence Furth, PA-C  COVID-19 Vaccine Information can be found at: [email or web address] For questions related to vaccine distribution or appointments, please email [email or web address] or call [phone].   

## 2022-05-23 NOTE — Telephone Encounter (Signed)
Patient has appointment with Dr. Mariah Milling today. Please refill if appropriate. Thank you!

## 2022-08-22 DIAGNOSIS — R7303 Prediabetes: Secondary | ICD-10-CM | POA: Insufficient documentation

## 2022-09-26 ENCOUNTER — Inpatient Hospital Stay: Payer: Medicare Other | Attending: Oncology | Admitting: Oncology

## 2022-09-26 ENCOUNTER — Encounter: Payer: Self-pay | Admitting: Oncology

## 2022-09-26 VITALS — BP 132/73 | HR 73 | Temp 97.3°F | Resp 18 | Ht 62.0 in | Wt 150.9 lb

## 2022-09-26 DIAGNOSIS — Z79899 Other long term (current) drug therapy: Secondary | ICD-10-CM | POA: Diagnosis not present

## 2022-09-26 DIAGNOSIS — Z853 Personal history of malignant neoplasm of breast: Secondary | ICD-10-CM

## 2022-09-26 DIAGNOSIS — M85852 Other specified disorders of bone density and structure, left thigh: Secondary | ICD-10-CM | POA: Insufficient documentation

## 2022-09-26 DIAGNOSIS — I10 Essential (primary) hypertension: Secondary | ICD-10-CM | POA: Insufficient documentation

## 2022-09-26 DIAGNOSIS — Z8719 Personal history of other diseases of the digestive system: Secondary | ICD-10-CM | POA: Diagnosis not present

## 2022-09-26 DIAGNOSIS — Z79811 Long term (current) use of aromatase inhibitors: Secondary | ICD-10-CM | POA: Diagnosis not present

## 2022-09-26 DIAGNOSIS — C50412 Malignant neoplasm of upper-outer quadrant of left female breast: Secondary | ICD-10-CM | POA: Diagnosis present

## 2022-09-26 DIAGNOSIS — Z170 Estrogen receptor positive status [ER+]: Secondary | ICD-10-CM | POA: Insufficient documentation

## 2022-09-26 DIAGNOSIS — Z08 Encounter for follow-up examination after completed treatment for malignant neoplasm: Secondary | ICD-10-CM | POA: Diagnosis not present
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
## 2022-10-10 ENCOUNTER — Ambulatory Visit: Payer: Medicare Other | Admitting: Obstetrics and Gynecology

## 2022-10-17 ENCOUNTER — Encounter: Payer: Self-pay | Admitting: Obstetrics and Gynecology

## 2022-10-17 ENCOUNTER — Ambulatory Visit (INDEPENDENT_AMBULATORY_CARE_PROVIDER_SITE_OTHER): Payer: Medicare Other | Admitting: Obstetrics and Gynecology

## 2022-10-17 VITALS — BP 149/81 | HR 60 | Ht 62.0 in | Wt 152.4 lb

## 2022-10-17 DIAGNOSIS — N8111 Cystocele, midline: Secondary | ICD-10-CM

## 2022-10-17 DIAGNOSIS — N816 Rectocele: Secondary | ICD-10-CM

## 2022-10-17 DIAGNOSIS — Z01419 Encounter for gynecological examination (general) (routine) without abnormal findings: Secondary | ICD-10-CM | POA: Diagnosis not present

## 2022-10-17 NOTE — Progress Notes (Signed)
HPI:      [name] is a 66 y.o. G2P2002 who LMP was No LMP recorded. Patient has had a hysterectomy.  Subjective:   She presents today for her annual examination.  She reports that she has been doing well.  She had a breast biopsy in April which turned out to be "fat necrosis".  She does have a remote history of breast cancer and has 1 month left of femora.  She is quite excited about this!.  She continues to get regular breast exams and mammography as scheduled. She has a history of hysterectomy. She has recently been to the dermatologist and had several skin tags frozen off.    Hx: The following portions of the patient's history were reviewed and updated as appropriate:             She  has a past medical history of Atypical chest pain, Breast cancer (HCC), Breast cancer of upper-outer quadrant of left female breast (HCC) (05/30/2017), Colon polyps, Complication of anesthesia, Concussion (1979), Diastolic dysfunction, Family history of adverse reaction to anesthesia, GERD (gastroesophageal reflux disease), History of hiatal hernia, Hypertension, Migraine, Osteopenia, Osteopenia, Personal history of radiation therapy, PONV (postoperative nausea and vomiting), Shingles, Spontaneous dissection of coronary artery (06/21/2017), SVT (supraventricular tachycardia), VAIN I (vaginal intraepithelial neoplasia grade I), and Varicose veins. She does not have any pertinent problems on file. She  has a past surgical history that includes Tubal ligation; Dilation and curettage of uterus; cervical cryosurgery (1980); Colonoscopy (2011); Vaginal hysterectomy; Breast lumpectomy (Left, 05/30/2017); Breast lumpectomy with sentinel lymph node bx (Left, 05/30/2017); LEFT HEART CATH AND CORONARY ANGIOGRAPHY (N/A, 06/22/2017); Breast biopsy (Left, 05/01/2017); Breast biopsy (Left, 05/10/2017); Breast biopsy (Left, 2020); Mastectomy; Colonoscopy with propofol (N/A, 11/12/2019); Breast biopsy (Left, 05/01/2022); Breast  biopsy (Left, 05/01/2022); Breast biopsy (Left, 05/01/2022); and Breast biopsy (Left, 05/01/2022). Her family history includes Anxiety disorder in her brother; Breast cancer (age of onset: 65) in her paternal aunt; Breast cancer (age of onset: 76) in her mother; Cancer in her paternal aunt; Diabetes in her father; Esophageal cancer (age of onset: 37) in her father; Hypertension in her brother, brother, and father; Stomach cancer in her paternal grandfather; Thyroid disease in her father. She  reports that she has never smoked. She has never used smokeless tobacco. She reports that she does not drink alcohol and does not use drugs. She has a current medication list which includes the following prescription(s): acetaminophen, aspirin ec, atorvastatin, calcium-vitamin d, letrozole, metoprolol tartrate, multivitamin with minerals, pantoprazole, ascorbic acid, and nitroglycerin. She has no active allergies.       Review of Systems:  Review of Systems  Constitutional: Denied constitutional symptoms, night sweats, recent illness, fatigue, fever, insomnia and weight loss.  Eyes: Denied eye symptoms, eye pain, photophobia, vision change and visual disturbance.  Ears/Nose/Throat/Neck: Denied ear, nose, throat or neck symptoms, hearing loss, nasal discharge, sinus congestion and sore throat.  Cardiovascular: Denied cardiovascular symptoms, arrhythmia, chest pain/pressure, edema, exercise intolerance, orthopnea and palpitations.  Respiratory: Denied pulmonary symptoms, asthma, pleuritic pain, productive sputum, cough, dyspnea and wheezing.  Gastrointestinal: Denied, gastro-esophageal reflux, melena, nausea and vomiting.  Genitourinary: Denied genitourinary symptoms including symptomatic vaginal discharge, pelvic relaxation issues, and urinary complaints.  Musculoskeletal: Denied musculoskeletal symptoms, stiffness, swelling, muscle weakness and myalgia.  Dermatologic: Denied dermatology symptoms, rash and scar.   Neurologic: Denied neurology symptoms, dizziness, headache, neck pain and syncope.  Psychiatric: Denied psychiatric symptoms, anxiety and depression.  Endocrine: Denied endocrine symptoms including hot flashes  and night sweats.   Meds:   Current Outpatient Medications on File Prior to Visit  Medication Sig Dispense Refill   acetaminophen (TYLENOL) 500 MG tablet Take 1,000 mg by mouth every 6 (six) hours as needed (for pain/headaches.).     aspirin 81 MG EC tablet Take 1 tablet (81 mg total) by mouth daily. 30 tablet 0   atorvastatin (LIPITOR) 80 MG tablet Take 1 tablet (80 mg total) by mouth daily at 6 PM. 90 tablet 4   calcium-vitamin D (OSCAL WITH D) 500-200 MG-UNIT tablet Take 2 tablets by mouth daily with breakfast.      letrozole (FEMARA) 2.5 MG tablet Take 1 tablet (2.5 mg total) by mouth daily. 90 tablet 1   metoprolol tartrate (LOPRESSOR) 50 MG tablet TAKE 1 TABLET BY MOUTH TWICE DAILY 180 tablet 3   Multiple Vitamin (MULTIVITAMIN WITH MINERALS) TABS tablet Take 1 tablet by mouth daily.     pantoprazole (PROTONIX) 40 MG tablet Take 40 mg by mouth daily.  5   vitamin C (ASCORBIC ACID) 500 MG tablet Take 500 mg by mouth daily.     nitroGLYCERIN (NITROSTAT) 0.4 MG SL tablet Place 1 tablet (0.4 mg total) under the tongue every 5 (five) minutes as needed for chest pain. 25 tablet 3   No current facility-administered medications on file prior to visit.     Objective:     Vitals:   10/17/22 0815 10/17/22 0822  BP: (!) 151/82 (!) 149/81  Pulse: 60     Filed Weights   10/17/22 0815  Weight: 152 lb 6.4 oz (69.1 kg)                Assessment:    [id number] Patient Active Problem List   Diagnosis Date Noted   Mixed hyperlipidemia 03/21/2018   Malignant neoplasm of upper-outer quadrant of left breast in female, estrogen receptor positive (HCC) 09/18/2017   Vaginal atrophy 09/18/2017   Stress incontinence, female 09/18/2017   Chest pain    Coronary artery dissection     Non-ST elevation myocardial infarction (NSTEMI), subsequent episode of care (HCC) 06/21/2017   Goals of care, counseling/discussion 05/18/2017   Osteopenia 05/14/2017   Primary cancer of upper outer quadrant of left breast (HCC) 05/13/2017   Menopausal state 08/26/2015   Status post vaginal hysterectomy 08/26/2015   VIN (vulval intraepithelial neoplasia) I 08/26/2015   Acid reflux 08/25/2014   H/O varicella 08/25/2014   LBP (low back pain) 08/25/2014   Headache, migraine 08/25/2014   Herpes zona 08/25/2014   Supraventricular tachycardia 08/25/2014   Essential hypertension 08/25/2014   Avitaminosis D 08/25/2014   Enthesopathy of hip 08/25/2014     1. Well woman exam with routine gynecological exam     Patient doing well without issue.  About to complete her course of femora for breast cancer.   Plan:            1.  Basic Screening Recommendations The basic screening recommendations for asymptomatic women were discussed with the patient during her visit.  The age-appropriate recommendations were discussed with her and the rational for the tests reviewed.  When I am informed by the patient that another primary care physician has previously obtained the age-appropriate tests and they are up-to-date, only outstanding tests are ordered and referrals given as necessary.  Abnormal results of tests will be discussed with her when all of her results are completed.  Routine preventative health maintenance measures emphasized: Exercise/Diet/Weight control, Tobacco Warnings, Alcohol/Substance use risks and Stress  Management  Orders No orders of the defined types were placed in this encounter.   No orders of the defined types were placed in this encounter.           F/U  Return in about 1 year (around 10/17/2023) for Annual Physical.  Elonda Husky, M.D. 10/17/2022 8:42 AM

## 2022-10-17 NOTE — Progress Notes (Signed)
Patients presents for annual exam today. She states doing well, has one more month of Femara and is happy about that. History of hysterectomy. Mammogram and lab work are up to date. She states no other questions or concerns at this time.

## 2022-12-04 ENCOUNTER — Other Ambulatory Visit: Payer: Self-pay | Admitting: Oncology

## 2023-02-21 ENCOUNTER — Other Ambulatory Visit: Payer: Self-pay | Admitting: General Surgery

## 2023-02-21 DIAGNOSIS — Z853 Personal history of malignant neoplasm of breast: Secondary | ICD-10-CM

## 2023-03-12 ENCOUNTER — Ambulatory Visit
Admission: RE | Admit: 2023-03-12 | Discharge: 2023-03-12 | Disposition: A | Discharge status: Home / Self Care | Payer: Medicare Other | Source: Ambulatory Visit | Attending: Oncology | Admitting: Oncology

## 2023-03-12 DIAGNOSIS — M85852 Other specified disorders of bone density and structure, left thigh: Secondary | ICD-10-CM | POA: Diagnosis present

## 2023-03-12 DIAGNOSIS — Z79811 Long term (current) use of aromatase inhibitors: Secondary | ICD-10-CM | POA: Insufficient documentation

## 2023-03-12 DIAGNOSIS — Z853 Personal history of malignant neoplasm of breast: Secondary | ICD-10-CM | POA: Diagnosis present

## 2023-03-12 DIAGNOSIS — Z08 Encounter for follow-up examination after completed treatment for malignant neoplasm: Secondary | ICD-10-CM | POA: Insufficient documentation

## 2023-03-23 ENCOUNTER — Ambulatory Visit: Payer: Medicare Other | Admitting: Oncology

## 2023-04-26 ENCOUNTER — Ambulatory Visit
Admission: RE | Admit: 2023-04-26 | Discharge: 2023-04-26 | Disposition: A | Discharge status: Home / Self Care | Payer: Medicare Other | Source: Ambulatory Visit | Attending: General Surgery | Admitting: General Surgery

## 2023-04-26 DIAGNOSIS — Z853 Personal history of malignant neoplasm of breast: Secondary | ICD-10-CM | POA: Insufficient documentation

## 2023-05-01 ENCOUNTER — Encounter: Payer: Self-pay | Admitting: Oncology

## 2023-05-01 ENCOUNTER — Inpatient Hospital Stay: Payer: Medicare Other | Attending: Oncology | Admitting: Oncology

## 2023-05-01 VITALS — BP 147/66 | HR 67 | Temp 97.4°F | Resp 18 | Ht 62.0 in | Wt 155.2 lb

## 2023-05-01 DIAGNOSIS — Z809 Family history of malignant neoplasm, unspecified: Secondary | ICD-10-CM | POA: Insufficient documentation

## 2023-05-01 DIAGNOSIS — Z08 Encounter for follow-up examination after completed treatment for malignant neoplasm: Secondary | ICD-10-CM | POA: Diagnosis present

## 2023-05-01 DIAGNOSIS — Z808 Family history of malignant neoplasm of other organs or systems: Secondary | ICD-10-CM | POA: Insufficient documentation

## 2023-05-01 DIAGNOSIS — M8580 Other specified disorders of bone density and structure, unspecified site: Secondary | ICD-10-CM | POA: Diagnosis not present

## 2023-05-01 DIAGNOSIS — Z79899 Other long term (current) drug therapy: Secondary | ICD-10-CM | POA: Insufficient documentation

## 2023-05-01 DIAGNOSIS — Z800 Family history of malignant neoplasm of digestive organs: Secondary | ICD-10-CM | POA: Diagnosis not present

## 2023-05-01 DIAGNOSIS — Z803 Family history of malignant neoplasm of breast: Secondary | ICD-10-CM | POA: Insufficient documentation

## 2023-05-01 DIAGNOSIS — Z923 Personal history of irradiation: Secondary | ICD-10-CM | POA: Insufficient documentation

## 2023-05-01 DIAGNOSIS — Z853 Personal history of malignant neoplasm of breast: Secondary | ICD-10-CM | POA: Insufficient documentation
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
## 2023-05-08 ENCOUNTER — Other Ambulatory Visit: Payer: Medicare Other

## 2023-07-05 ENCOUNTER — Other Ambulatory Visit: Payer: Self-pay | Admitting: Cardiovascular Disease

## 2023-07-06 ENCOUNTER — Other Ambulatory Visit: Payer: Self-pay | Admitting: Cardiovascular Disease

## 2023-09-03 NOTE — Progress Notes (Unsigned)
 Cardiology Office Note  Date:  09/04/2023   ID:  Maria Barnes, Maria Barnes 11-29-1955, MRN [id number]  PCP:  Valora Agent, MD   Chief Complaint  Patient presents with   12 month follow up     Doing well.     HPI:  68 year old woman with past medical history of SVT  hypertension  lumpectomy for breast cancer,  substernal chest pain, diaphoresis sweating , troponins elevated CTA chest was performed and was neg for PE.   Cardiac cath 2019 with suspected coronary dissection of the mid to distal LAD with severe stenosis/tapering mid LAD to apical region Severe apical and periapical akinesis. Mildly depressed EF, 45 to 50% on echo 05/2017 Normal echo 10/2017, normal ejection fraction Who presents for follow-up of her coronary artery disease , dissection  LOV 4/24 Goes to gym 3x a week, [employer] in the hospital uses the treadmill, bikes No shortness of breath or chest discomfort on exertion  Active at baseline Rhythm well-controlled on metoprolol  tartrate 50 twice daily  Denies significant  SVT symptoms  Labs reviewed Total chol 128, LDL 46 HAB1C 6.1  EKG personally reviewed by myself on todays visit EKG Interpretation Date/Time:  Tuesday September 04 2023 08:52:52 EDT Ventricular Rate:  61 PR Interval:  102 QRS Duration:  86 QT Interval:  438 QTC Calculation: 440 R Axis:   60  Text Interpretation: Sinus rhythm with short PR When compared with ECG of 08-Nov-2017 12:38, No significant change was found Confirmed by Perla Lye [phone]) on 09/04/2023 8:55:43 AM    Prior oncology hx  stage Ia left breast cancer status post lumpectomy and sentinel lymph node biopsy in May 2019.  Pathology showed 2 adjacent foci of invasive mammary carcinoma.  Largest focus was 11 mm and grade 1.  There was intermediate grade DCIS with focal necrosis spanning 2.4 cm.  Tumor was greater than 90% ER PR positive and HER-2/neu negative Oncotype DX recurrence score was 4 and she did not require adjuvant  chemotherapy.  She completed postlumpectomy radiation and was started on Femara   Father passed 12/2018 Esophageal cancer  LHC 06/22/17:  Mid LAD to Dist LAD lesion is 80% stenosed. The left ventricular ejection fraction is 45-50% by visual estimate. LV end diastolic pressure is normal. There is mild left ventricular systolic dysfunction. There is no mitral valve regurgitation.    PMH:   has a past medical history of Atypical chest pain, Breast cancer (HCC), Breast cancer of upper-outer quadrant of left female breast (HCC) (05/30/2017), Colon polyps, Complication of anesthesia, Concussion (1979), Diastolic dysfunction, Family history of adverse reaction to anesthesia, GERD (gastroesophageal reflux disease), History of hiatal hernia, Hypertension, Migraine, Osteopenia, Osteopenia, Personal history of radiation therapy, PONV (postoperative nausea and vomiting), Shingles, Spontaneous dissection of coronary artery (06/21/2017), SVT (supraventricular tachycardia) (HCC), VAIN I (vaginal intraepithelial neoplasia grade I), and Varicose veins.  PSH:    Past Surgical History:  Procedure Laterality Date   BREAST BIOPSY Left 05/01/2017    x 2 areas.  2:00 6cmfn irregular mass wing clip.  2:00 6cmfn round mass venus clip. DUCTAL CARCINOMA IN SITU and INVASIVE MAMMARY CARCINOMA   BREAST BIOPSY Left 05/10/2017   affirm bx with  X marker  PSEUDO-ANGIOMATOUS STROMAL HYPERPLASIA /Upper outer Quad   BREAST BIOPSY Left 2020   [facility] office- 3 oclock- fibrosis and fat necrosis   BREAST BIOPSY Left 05/01/2022   Us  Core Bx, Ribbon clip- neg   BREAST BIOPSY Left 05/01/2022   Us  Core bx, coil clip -  neg   BREAST BIOPSY Left 05/01/2022   US  LT BREAST BX W LOC DEV 1ST LESION IMG BX SPEC US  GUIDE 05/01/2022 [facility]   BREAST BIOPSY Left 05/01/2022   US  LT BREAST BX W LOC DEV EA ADD LESION IMG BX SPEC US  GUIDE 05/01/2022 [facility]   BREAST LUMPECTOMY Left 05/30/2017   lumpectomy of wing and venus  markers, invasive mammary carcinoma and DCIS   BREAST LUMPECTOMY WITH SENTINEL LYMPH NODE BIOPSY Left 05/30/2017   Procedure: BREAST LUMPECTOMY WITH SENTINEL LYMPH NODE BX,NEEDLE LOCALIZATION;  Surgeon: Dessa Reyes ORN, MD;  Location: [facility];  Service: General;  Laterality: Left;   cervical cryosurgery  1980   COLONOSCOPY  2011   COLONOSCOPY WITH PROPOFOL  N/A 11/12/2019   Procedure: COLONOSCOPY WITH PROPOFOL ;  Surgeon: Dessa Reyes ORN, MD;  Location: [facility];  Service: Endoscopy;  Laterality: N/A;   DILATION AND CURETTAGE OF UTERUS     x 2   LEFT HEART CATH AND CORONARY ANGIOGRAPHY N/A 06/22/2017   Procedure: LEFT HEART CATH AND CORONARY ANGIOGRAPHY;  Surgeon: Perla Evalene PARAS, MD;  Location: [facility];  Service: Cardiovascular;  Laterality: N/A;   MASTECTOMY     partial lumpectomy left   TUBAL LIGATION     VAGINAL HYSTERECTOMY     tvh    Current Outpatient Medications  Medication Sig Dispense Refill   acetaminophen  (TYLENOL ) 500 MG tablet Take 1,000 mg by mouth every 6 (six) hours as needed (for pain/headaches.).     aspirin  81 MG EC tablet Take 1 tablet (81 mg total) by mouth daily. 30 tablet 0   atorvastatin  (LIPITOR) 80 MG tablet Take 1 tablet (80 mg total) by mouth daily at 6 PM. 90 tablet 4   calcium -vitamin D (OSCAL WITH D) 500-200 MG-UNIT tablet Take 2 tablets by mouth daily with breakfast.      magnesium oxide (MAG-OX) 400 MG tablet Take by mouth.     metoprolol  tartrate (LOPRESSOR ) 50 MG tablet TAKE 1 TABLET BY MOUTH TWICE DAILY 60 tablet 0   Multiple Vitamin (MULTIVITAMIN WITH MINERALS) TABS tablet Take 1 tablet by mouth daily.     nitroGLYCERIN  (NITROSTAT ) 0.4 MG SL tablet Place 1 tablet (0.4 mg total) under the tongue every 5 (five) minutes as needed for chest pain. 25 tablet 3   pantoprazole  (PROTONIX ) 40 MG tablet Take 40 mg by mouth daily.  5   vitamin C  (ASCORBIC ACID ) 500 MG tablet Take 500 mg by mouth daily.     letrozole  (FEMARA ) 2.5 MG  tablet Take 1 tablet (2.5 mg total) by mouth daily. (Patient not taking: Reported on 09/04/2023) 90 tablet 1   No current facility-administered medications for this visit.    Allergies:   Patient has no known allergies.   Social History:  The patient  reports that she has never smoked. She has never used smokeless tobacco. She reports that she does not drink alcohol  and does not use drugs.   Family History:   family history includes Anxiety disorder in her brother; Breast cancer (age of onset: 100) in her paternal aunt; Breast cancer (age of onset: 43) in her mother; Cancer in her paternal aunt; Diabetes in her father; Esophageal cancer (age of onset: 59) in her father; Hypertension in her brother, brother, and father; Stomach cancer in her paternal grandfather; Thyroid disease in her father.    Review of Systems: Review of Systems  Constitutional: Negative.   HENT: Negative.    Respiratory: Negative.    Cardiovascular:  Negative.   Gastrointestinal: Negative.   Musculoskeletal: Negative.   Neurological: Negative.   Psychiatric/Behavioral: Negative.    All other systems reviewed and are negative.  PHYSICAL EXAM: VS:  BP 126/78 (BP Location: Left Arm, Patient Position: Sitting, Cuff Size: Normal)   Pulse 61   Ht 5' 2 (1.575 m)   Wt 156 lb (70.8 kg)   SpO2 95%   BMI 28.53 kg/m  , BMI Body mass index is 28.53 kg/m. Constitutional:  oriented to person, place, and time. No distress.  HENT:  Head: Grossly normal Eyes:  no discharge. No scleral icterus.  Neck: No JVD, no carotid bruits  Cardiovascular: Regular rate and rhythm, no murmurs appreciated Pulmonary/Chest: Clear to auscultation bilaterally, no wheezes or rails Abdominal: Soft.  no distension.  no tenderness.  Musculoskeletal: Normal range of motion Neurological:  normal muscle tone. Coordination normal. No atrophy Skin: Skin warm and dry Psychiatric: normal affect, pleasant  Recent Labs: No results found for requested  labs within last 365 days.    Lipid Panel Lab Results  Component Value Date   CHOL 114 10/09/2017   HDL 39 (L) 10/09/2017   LDLCALC 52 10/09/2017   TRIG 117 10/09/2017    Wt Readings from Last 3 Encounters:  09/04/23 156 lb (70.8 kg)  05/01/23 155 lb 3.2 oz (70.4 kg)  10/17/22 152 lb 6.4 oz (69.1 kg)     ASSESSMENT AND PLAN:  NSTEMI (non-ST elevated myocardial infarction) (HCC)  spontaneous coronary dissection 2019, mildly depressed ejection fraction May 2019 Normal echo December 2019,  Currently with no symptoms of angina. No further workup at this time. Continue current medication regimen.  Essential (primary) hypertension Blood pressure is well controlled on today's visit. No changes made to the medications/metoprolol .  Supraventricular tachycardia (HCC) No recent episodes of SVT Arrhythmia well-controlled on metoprolol  tartrate 50 twice daily  Extra half dose metoprolol  as needed for breakthrough palpitations or tachycardia  Breast cancer Completed radiation treatment, on the left Completed treatment of letrazole  Coronary artery dissection Made full recovery, no further intervention needed at this time     Orders Placed This Encounter  Procedures   EKG 12-Lead     Signed, Velinda Lunger, M.D., Ph.D. 09/04/2023  [facility] [place], [place] [phone]

## 2023-09-04 ENCOUNTER — Encounter: Payer: Self-pay | Admitting: Cardiovascular Disease

## 2023-09-04 ENCOUNTER — Ambulatory Visit: Attending: Cardiovascular Disease | Admitting: Cardiovascular Disease

## 2023-09-04 VITALS — BP 126/78 | HR 61 | Ht 62.0 in | Wt 156.0 lb

## 2023-09-04 DIAGNOSIS — I10 Essential (primary) hypertension: Secondary | ICD-10-CM | POA: Diagnosis not present

## 2023-09-04 DIAGNOSIS — I471 Supraventricular tachycardia, unspecified: Secondary | ICD-10-CM

## 2023-09-04 DIAGNOSIS — I25118 Atherosclerotic heart disease of native coronary artery with other forms of angina pectoris: Secondary | ICD-10-CM

## 2023-09-04 DIAGNOSIS — E782 Mixed hyperlipidemia: Secondary | ICD-10-CM | POA: Diagnosis not present

## 2023-09-04 MED ORDER — ATORVASTATIN CALCIUM 80 MG PO TABS: 80.0000 mg | ORAL_TABLET | Freq: Every day | ORAL | 4 refills | Status: AC

## 2023-09-04 MED ORDER — METOPROLOL TARTRATE 50 MG PO TABS: 50.0000 mg | ORAL_TABLET | Freq: Two times a day (BID) | ORAL | 3 refills | Status: AC

## 2023-09-04 NOTE — Patient Instructions (Signed)

## 2023-10-18 ENCOUNTER — Ambulatory Visit: Admitting: Obstetrics and Gynecology

## 2023-10-31 ENCOUNTER — Encounter: Payer: Self-pay | Admitting: Oncology

## 2023-10-31 ENCOUNTER — Inpatient Hospital Stay: Attending: Oncology | Admitting: Oncology

## 2023-10-31 VITALS — BP 138/62 | HR 65 | Temp 98.7°F | Resp 19 | Ht 62.0 in | Wt 153.6 lb

## 2023-10-31 DIAGNOSIS — Z853 Personal history of malignant neoplasm of breast: Secondary | ICD-10-CM

## 2023-10-31 DIAGNOSIS — Z08 Encounter for follow-up examination after completed treatment for malignant neoplasm: Secondary | ICD-10-CM

## 2023-10-31 DIAGNOSIS — Z17411 Hormone receptor positive with human epidermal growth factor receptor 2 negative status: Secondary | ICD-10-CM | POA: Insufficient documentation

## 2023-10-31 DIAGNOSIS — Z79811 Long term (current) use of aromatase inhibitors: Secondary | ICD-10-CM | POA: Insufficient documentation

## 2023-10-31 DIAGNOSIS — D0512 Intraductal carcinoma in situ of left breast: Secondary | ICD-10-CM | POA: Insufficient documentation

## 2023-10-31 NOTE — Progress Notes (Signed)
 Patient doing great, no new or acute concerns at this time.
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
## 2023-11-01 NOTE — Patient Instructions (Signed)
 Preventive Care 83 Years and Older, Female Preventive care refers to lifestyle choices and visits with your health care provider that can promote health and wellness. Preventive care visits are also called wellness exams. What can I expect for my preventive care visit? Counseling Your health care provider may ask you questions about your: Medical history, including: Past medical problems. Family medical history. Pregnancy and menstrual history. History of falls. Current health, including: Memory and ability to understand (cognition). Emotional well-being. Home life and relationship well-being. Sexual activity and sexual health. Lifestyle, including: Alcohol, nicotine or tobacco, and drug use. Access to firearms. Diet, exercise, and sleep habits. Work and work Astronomer. Sunscreen use. Safety issues such as seatbelt and bike helmet use. Physical exam Your health care provider will check your: Height and weight. These may be used to calculate your BMI (body mass index). BMI is a measurement that tells if you are at a healthy weight. Waist circumference. This measures the distance around your waistline. This measurement also tells if you are at a healthy weight and may help predict your risk of certain diseases, such as type 2 diabetes and high blood pressure. Heart rate and blood pressure. Body temperature. Skin for abnormal spots. What immunizations do I need?  Vaccines are usually given at various ages, according to a schedule. Your health care provider will recommend vaccines for you based on your age, medical history, and lifestyle or other factors, such as travel or where you work. What tests do I need? Screening Your health care provider may recommend screening tests for certain conditions. This may include: Lipid and cholesterol levels. Hepatitis C test. Hepatitis B test. HIV (human immunodeficiency virus) test. STI (sexually transmitted infection) testing, if you are at  risk. Lung cancer screening. Colorectal cancer screening. Diabetes screening. This is done by checking your blood sugar (glucose) after you have not eaten for a while (fasting). Mammogram. Talk with your health care provider about how often you should have regular mammograms. BRCA-related cancer screening. This may be done if you have a family history of breast, ovarian, tubal, or peritoneal cancers. Bone density scan. This is done to screen for osteoporosis. Talk with your health care provider about your test results, treatment options, and if necessary, the need for more tests. Follow these instructions at home: Eating and drinking  Eat a diet that includes fresh fruits and vegetables, whole grains, lean protein, and low-fat dairy products. Limit your intake of foods with high amounts of sugar, saturated fats, and salt. Take vitamin and mineral supplements as recommended by your health care provider. Do not drink alcohol if your health care provider tells you not to drink. If you drink alcohol: Limit how much you have to 0-1 drink a day. Know how much alcohol is in your drink. In the [place] one drink equals one 12 oz bottle of beer (355 mL), one 5 oz glass of wine (148 mL), or one 1 oz glass of hard liquor (44 mL). Lifestyle Brush your teeth every morning and night with fluoride toothpaste. Floss one time each day. Exercise for at least 30 minutes 5 or more days each week. Do not use any products that contain nicotine or tobacco. These products include cigarettes, chewing tobacco, and vaping devices, such as e-cigarettes. If you need help quitting, ask your health care provider. Do not use drugs. If you are sexually active, practice safe sex. Use a condom or other form of protection in order to prevent STIs. Take aspirin only as told by  your health care provider. Make sure that you understand how much to take and what form to take. Work with your health care provider to find out whether it  is safe and beneficial for you to take aspirin daily. Ask your health care provider if you need to take a cholesterol-lowering medicine (statin). Find healthy ways to manage stress, such as: Meditation, yoga, or listening to music. Journaling. Talking to a trusted person. Spending time with friends and family. Minimize exposure to UV radiation to reduce your risk of skin cancer. Safety Always wear your seat belt while driving or riding in a vehicle. Do not drive: If you have been drinking alcohol. Do not ride with someone who has been drinking. When you are tired or distracted. While texting. If you have been using any mind-altering substances or drugs. Wear a helmet and other protective equipment during sports activities. If you have firearms in your house, make sure you follow all gun safety procedures. What's next? Visit your health care provider once a year for an annual wellness visit. Ask your health care provider how often you should have your eyes and teeth checked. Stay up to date on all vaccines. This information is not intended to replace advice given to you by your health care provider. Make sure you discuss any questions you have with your health care provider. Document Revised: 07/14/2020 Document Reviewed: 07/14/2020 Elsevier Patient Education  2024 [employer].
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
## 2023-11-06 ENCOUNTER — Encounter: Payer: Self-pay | Admitting: Obstetrics

## 2023-11-06 ENCOUNTER — Ambulatory Visit (INDEPENDENT_AMBULATORY_CARE_PROVIDER_SITE_OTHER): Admitting: Obstetrics

## 2023-11-06 VITALS — BP 127/67 | HR 70 | Ht 62.0 in | Wt 154.4 lb

## 2023-11-06 DIAGNOSIS — Z01419 Encounter for gynecological examination (general) (routine) without abnormal findings: Secondary | ICD-10-CM

## 2024-11-04 ENCOUNTER — Ambulatory Visit: Admitting: Oncology
